# Patient Record
Sex: Female | Born: 1975 | Hispanic: No | Marital: Single | Smoking: Current every day smoker
Health system: Southern US, Community
[De-identification: ages and names within clinical notes are randomized; demographics above are authoritative.]

## PROBLEM LIST (undated history)

## (undated) DIAGNOSIS — R112 Nausea with vomiting, unspecified: Secondary | ICD-10-CM

## (undated) DIAGNOSIS — F419 Anxiety disorder, unspecified: Secondary | ICD-10-CM

## (undated) DIAGNOSIS — I10 Essential (primary) hypertension: Secondary | ICD-10-CM

## (undated) DIAGNOSIS — Z87442 Personal history of urinary calculi: Secondary | ICD-10-CM

## (undated) DIAGNOSIS — J45909 Unspecified asthma, uncomplicated: Secondary | ICD-10-CM

## (undated) DIAGNOSIS — F329 Major depressive disorder, single episode, unspecified: Secondary | ICD-10-CM

## (undated) DIAGNOSIS — R87629 Unspecified abnormal cytological findings in specimens from vagina: Secondary | ICD-10-CM

## (undated) DIAGNOSIS — D649 Anemia, unspecified: Secondary | ICD-10-CM

## (undated) DIAGNOSIS — K219 Gastro-esophageal reflux disease without esophagitis: Secondary | ICD-10-CM

## (undated) DIAGNOSIS — Z9889 Other specified postprocedural states: Secondary | ICD-10-CM

## (undated) DIAGNOSIS — N200 Calculus of kidney: Secondary | ICD-10-CM

## (undated) DIAGNOSIS — G43909 Migraine, unspecified, not intractable, without status migrainosus: Secondary | ICD-10-CM

## (undated) DIAGNOSIS — N83209 Unspecified ovarian cyst, unspecified side: Secondary | ICD-10-CM

## (undated) DIAGNOSIS — T8859XA Other complications of anesthesia, initial encounter: Secondary | ICD-10-CM

## (undated) DIAGNOSIS — F32A Depression, unspecified: Secondary | ICD-10-CM

## (undated) DIAGNOSIS — Z8489 Family history of other specified conditions: Secondary | ICD-10-CM

## (undated) HISTORY — DX: Unspecified asthma, uncomplicated: J45.909

## (undated) HISTORY — DX: Depression, unspecified: F32.A

## (undated) HISTORY — DX: Migraine, unspecified, not intractable, without status migrainosus: G43.909

## (undated) HISTORY — DX: Unspecified abnormal cytological findings in specimens from vagina: R87.629

## (undated) HISTORY — DX: Calculus of kidney: N20.0

## (undated) HISTORY — PX: DIAGNOSTIC LAPAROSCOPY: SUR761

## (undated) HISTORY — PX: CERVICAL BIOPSY  W/ LOOP ELECTRODE EXCISION: SUR135

## (undated) HISTORY — DX: Major depressive disorder, single episode, unspecified: F32.9

---

## 1997-11-01 ENCOUNTER — Other Ambulatory Visit: Admission: RE | Admit: 1997-11-01 | Discharge: 1997-11-01 | Payer: Self-pay | Admitting: *Deleted

## 2013-05-15 HISTORY — PX: BREAST BIOPSY: SHX20

## 2014-01-08 ENCOUNTER — Encounter: Payer: Self-pay | Admitting: Family Medicine

## 2014-01-08 ENCOUNTER — Telehealth (HOSPITAL_COMMUNITY): Payer: Self-pay | Admitting: *Deleted

## 2014-01-08 LAB — POCT PREGNANCY, URINE: Preg Test, Ur: NEGATIVE

## 2014-01-08 NOTE — Telephone Encounter (Signed)
Attempted to call patient to schedule her an appointment for BCCCP. No on answered phone. Left voicemail for patient to call me back.

## 2014-01-09 ENCOUNTER — Encounter (HOSPITAL_COMMUNITY): Payer: Self-pay

## 2014-01-09 ENCOUNTER — Ambulatory Visit (HOSPITAL_COMMUNITY)
Admission: RE | Admit: 2014-01-09 | Discharge: 2014-01-09 | Disposition: A | Discharge status: Home / Self Care | Payer: Self-pay | Source: Ambulatory Visit | Attending: Obstetrics and Gynecology | Admitting: Obstetrics and Gynecology

## 2014-01-09 ENCOUNTER — Telehealth: Payer: Self-pay

## 2014-01-09 VITALS — BP 110/72 | Temp 98.6°F | Ht 71.0 in | Wt 184.6 lb

## 2014-01-09 DIAGNOSIS — Z1239 Encounter for other screening for malignant neoplasm of breast: Secondary | ICD-10-CM

## 2014-01-09 DIAGNOSIS — R87613 High grade squamous intraepithelial lesion on cytologic smear of cervix (HGSIL): Secondary | ICD-10-CM | POA: Insufficient documentation

## 2014-01-09 NOTE — Progress Notes (Signed)
Patient referred to [facility] by the [facility] due to having an abnormal Pap smear on 06/13/2013 and recommending a colposcopy for follow-up.  Pap Smear:  Pap smear not completed today. 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 Referred patient to the [facility] for colposcopy to follow-up for abnormal Pap smear smear. [facility] will call patient today to give her follow-up appointment.  Per patient has no history of abnormal Pap smears prior to the most recent Pap smear. Pap smear result is scanned in EPIC under media.  Physical exam: Breasts Breasts symmetrical. No skin abnormalities bilateral breasts. No nipple retraction bilateral breasts. No nipple discharge bilateral breasts. No lymphadenopathy. No lumps palpated bilateral breasts. No complaints of pain or tenderness on exam. Screening mammogram recommended at age 13 unless clinically indicated prior. Will request results from [facility] for previous mammogram and breast biopsy results.      Pelvic/Bimanual No Pap smear completed today since last Pap smear was 06/13/2013. Pap smear not indicated per BCCCP guidelines.   Smoking cessation discussed with patient. Patient referred to the [facility] Quit line and given information about smoking cessation classes offered at the [facility].

## 2014-01-09 NOTE — Telephone Encounter (Signed)
Called pt and informed pt that we have an appt for her colposcopy scheduled for 01/15/14 @ 1430 pm.  Pt stated understanding with no further questions.

## 2014-01-09 NOTE — Patient Instructions (Signed)
Patient did not need a Pap smear today due to last Pap smear was 06/13/2013. Referred patient to the [facility] for colposcopy to follow-up for abnormal Pap smear smear. The [facility] will call patient today to give her follow-up appointment. Told patient if she does hear from the [facility] today to call me tomorrow. Patient signed release for results from previous breast biopsy at [facility] to determine if any follow-up is needed. Wendy Singh verbalized understanding.  Traci Gafford, Arvil Chaco, RN 9:50 AM

## 2014-01-11 ENCOUNTER — Encounter: Payer: Self-pay | Admitting: *Deleted

## 2014-01-15 ENCOUNTER — Ambulatory Visit (INDEPENDENT_AMBULATORY_CARE_PROVIDER_SITE_OTHER): Payer: Medicaid Other | Admitting: Obstetrics & Gynecology

## 2014-01-15 ENCOUNTER — Other Ambulatory Visit: Payer: Self-pay | Admitting: Obstetrics & Gynecology

## 2014-01-15 ENCOUNTER — Other Ambulatory Visit (HOSPITAL_COMMUNITY)
Admission: RE | Admit: 2014-01-15 | Discharge: 2014-01-15 | Disposition: A | Discharge status: Home / Self Care | Payer: Medicaid Other | Source: Ambulatory Visit | Attending: Obstetrics & Gynecology | Admitting: Obstetrics & Gynecology

## 2014-01-15 ENCOUNTER — Encounter: Payer: Self-pay | Admitting: Obstetrics & Gynecology

## 2014-01-15 VITALS — BP 108/74 | Temp 98.0°F | Ht 71.0 in | Wt 183.9 lb

## 2014-01-15 DIAGNOSIS — N898 Other specified noninflammatory disorders of vagina: Secondary | ICD-10-CM

## 2014-01-15 DIAGNOSIS — N942 Vaginismus: Secondary | ICD-10-CM | POA: Diagnosis present

## 2014-01-15 DIAGNOSIS — R87613 High grade squamous intraepithelial lesion on cytologic smear of cervix (HGSIL): Secondary | ICD-10-CM

## 2014-01-15 LAB — POCT PREGNANCY, URINE: Preg Test, Ur: NEGATIVE

## 2014-01-15 NOTE — Patient Instructions (Signed)

## 2014-01-15 NOTE — Progress Notes (Signed)
  Colposcopy Procedure Note  Indications: Pap smear 7 months ago showed: high-grade squamous intraepithelial neoplasia  (HGSIL-encompassing moderate and severe dysplasia). The prior pap showed no abnormalities.  Prior cervical/vaginal disease: denies prior cervical disease. Prior cervical treatment: no treatment.  Procedure Details  The risks and benefits of the procedure and Written informed consent obtained.  Speculum placed in vagina and excellent visualization of cervix achieved, cervix swabbed x 3 with acetic acid solution.  Findings: Cervix: acetowhite lesion(s) noted at 12 adn 6 o'clock; cervix swabbed with Lugol's solution, SCJ visualized - lesion at 12 & 6 o'clock, cervical biopsies taken at 12 & 6 o'clock, specimen labelled and sent to pathology and hemostasis achieved with Monsel's solution. Vaginal inspection: vaginal colposcopy not performed. Vulvar colposcopy: vulvar colposcopy not performed.  Specimens: cervical biopsies at 6 and 12 o'clock; ECC  Complications: none.  Plan: Specimens labelled and sent to Pathology. Will base further treatment on Pathology findings. Return to discuss Pathology results in 3 weeks. Booking for LEEP at this time.

## 2014-01-16 LAB — WET PREP, GENITAL
Clue Cells Wet Prep HPF POC: NONE SEEN
Trich, Wet Prep: NONE SEEN
Yeast Wet Prep HPF POC: NONE SEEN

## 2014-01-16 LAB — GC/CHLAMYDIA PROBE AMP
CT Probe RNA: NEGATIVE
GC Probe RNA: NEGATIVE

## 2014-01-17 ENCOUNTER — Encounter: Payer: Self-pay | Admitting: Obstetrics & Gynecology

## 2014-01-18 ENCOUNTER — Encounter: Payer: Self-pay | Admitting: Obstetrics & Gynecology

## 2014-01-18 DIAGNOSIS — D069 Carcinoma in situ of cervix, unspecified: Secondary | ICD-10-CM | POA: Insufficient documentation

## 2014-01-19 ENCOUNTER — Encounter: Payer: Self-pay | Admitting: *Deleted

## 2014-02-01 ENCOUNTER — Encounter: Payer: Self-pay | Admitting: *Deleted

## 2014-02-13 ENCOUNTER — Telehealth: Payer: Self-pay

## 2014-02-13 NOTE — Telephone Encounter (Signed)
Patient called to discuss colpo results and all other testing results from 01/15/14. Informed her of results and the need for LEEP-- patient states she knew her scheduled appointment for tomorrow (needed to reschedule) was for LEEP but was never called about the results. Explained LEEP procedure and assured patient that her colpo showed an abnormality, however, it is not cancer. Explained that the LEEP procedure will cut out abnormal tissue. Reassured patient that it is OK she needed to reschedule her appointment to October. Patient verbalized understanding and gratitude. No further questions or concerns.

## 2014-02-14 ENCOUNTER — Encounter: Payer: Self-pay | Admitting: Obstetrics & Gynecology

## 2014-03-26 ENCOUNTER — Encounter: Payer: Self-pay | Admitting: Obstetrics & Gynecology

## 2014-03-26 ENCOUNTER — Other Ambulatory Visit (HOSPITAL_COMMUNITY)
Admission: RE | Admit: 2014-03-26 | Discharge: 2014-03-26 | Disposition: A | Discharge status: Home / Self Care | Payer: Medicaid Other | Source: Ambulatory Visit | Attending: Obstetrics & Gynecology | Admitting: Obstetrics & Gynecology

## 2014-03-26 ENCOUNTER — Ambulatory Visit (INDEPENDENT_AMBULATORY_CARE_PROVIDER_SITE_OTHER): Payer: Medicaid Other | Admitting: Obstetrics & Gynecology

## 2014-03-26 VITALS — BP 100/63 | HR 79 | Temp 97.2°F | Ht 71.0 in | Wt 177.1 lb

## 2014-03-26 DIAGNOSIS — R87619 Unspecified abnormal cytological findings in specimens from cervix uteri: Secondary | ICD-10-CM | POA: Diagnosis present

## 2014-03-26 DIAGNOSIS — D069 Carcinoma in situ of cervix, unspecified: Secondary | ICD-10-CM

## 2014-03-26 DIAGNOSIS — Z01812 Encounter for preprocedural laboratory examination: Secondary | ICD-10-CM

## 2014-03-26 LAB — POCT PREGNANCY, URINE: Preg Test, Ur: NEGATIVE

## 2014-03-26 NOTE — Progress Notes (Signed)
   Subjective:    Patient ID: Wendy Singh, female    DOB: 18-Dec-1975, 38 y.o.   MRN: [id number]  HPI She is here for a LEEP.   Review of Systems     Objective:   Physical Exam UPT negative, consent signed, time out done Cervix prepped with acetic acid. Transformation zone seen in its entirety. Colpo adequate. Changes c/w hGSIL seen in a circumferencial fashion at the os (acetowhite changes) A total of 20 cc of 1% lidocaine was used to do a cervical block. I then used the large "apple core" Leep device to excise a cone shaped piece of cervix. I then obtained a deeper specimen with the samll "apple corer). ECC obtained.  Cautery and Monsel's solution yielded hemostasis  She tolerated the procedure well.        Assessment & Plan:  CIN3- await LEEP pathology

## 2014-04-04 ENCOUNTER — Encounter: Payer: Self-pay | Admitting: *Deleted

## 2014-04-09 ENCOUNTER — Telehealth: Payer: Self-pay | Admitting: General Practice

## 2014-04-09 NOTE — Telephone Encounter (Signed)
Patient called and left message stating she had a LEEP done two weeks ago and isn't having the coffee ground discharge anymore but is having the same pinkish/clear discharge like she was having before the surgery and all her STD testing came back normal so she doesn't know what it could be. Also states that she is just having a weird feeling down there and would like a callback. Called patient back and she states the Dr was going to call her an antibiotic in but never did. Patient denies use of birth control, vaginal itching or odor.  Per chart review patient's symptoms are not listed as well as no antibiotics. Told patient to discuss this with Dr Hulan Fray the next time she is here for her post op visit. Patient verbalized understanding and had no other questions

## 2014-04-16 ENCOUNTER — Encounter: Payer: Self-pay | Admitting: Obstetrics & Gynecology

## 2014-04-25 ENCOUNTER — Ambulatory Visit: Payer: Self-pay | Admitting: Obstetrics & Gynecology

## 2014-05-31 ENCOUNTER — Ambulatory Visit: Payer: Self-pay | Admitting: Obstetrics & Gynecology

## 2014-06-01 ENCOUNTER — Telehealth (HOSPITAL_COMMUNITY): Payer: Self-pay | Admitting: *Deleted

## 2014-06-01 NOTE — Telephone Encounter (Signed)
Patient called me and left voicemail in regards to her breast being swollen with questions about what BCCCP will cover. Called patient back and let her know that her Medicaid is good through January and she can call the [facility] or [facility] to schedule an appointment. Told her after her Medicaid expires that she will need to come to [place] first since a new problem. Let her know that based on her follow-up from her LEEP will depend if she will eligible to renew her BCCCP Medicaid. Explained BCCCP Medicaid and suggested she apply for regular Medicaid when expires if not still eligible. Patient verbalized understanding.

## 2014-06-22 ENCOUNTER — Ambulatory Visit (INDEPENDENT_AMBULATORY_CARE_PROVIDER_SITE_OTHER): Payer: Medicaid Other | Admitting: Obstetrics & Gynecology

## 2014-06-22 ENCOUNTER — Encounter: Payer: Self-pay | Admitting: *Deleted

## 2014-06-22 ENCOUNTER — Encounter: Payer: Self-pay | Admitting: Obstetrics & Gynecology

## 2014-06-22 VITALS — BP 110/62 | HR 61 | Temp 97.8°F | Ht 71.0 in | Wt 185.5 lb

## 2014-06-22 DIAGNOSIS — N898 Other specified noninflammatory disorders of vagina: Secondary | ICD-10-CM

## 2014-06-22 MED ORDER — METRONIDAZOLE 500 MG PO TABS: 500.0000 mg | ORAL_TABLET | Freq: Two times a day (BID) | ORAL | Status: DC

## 2014-06-22 NOTE — Progress Notes (Signed)
   Subjective:    Patient ID: Wendy Singh, female    DOB: July 01, 1975, 39 y.o.   MRN: [id number]  HPI This 39 yo lady is here for follow up after a LEEP for CIN3. Her ecto margins and ECC were negative but endo margins were +.  She has not used contraception for 14 years and does not want any more kids.  She also complains of a heavy white discharge that is causing her to have to wear a pad.   Review of Systems     Objective:   Physical Exam Well healed cervix       Assessment & Plan:  As above- I spoke with Dr. Denman George regarding treatment/follow up options. When presented with these options, she prefers a CKC. Since she does not want kids, she is interested in a l/s BS.  I have just been informed by the nurses that her type of medicaid may not cover either of these procedures. In that case, she can be followed with pap and ECC in 6 months.  I will treat her probable BV with flagyl.

## 2014-06-23 LAB — WET PREP, GENITAL
Trich, Wet Prep: NONE SEEN
Yeast Wet Prep HPF POC: NONE SEEN

## 2014-09-17 ENCOUNTER — Inpatient Hospital Stay (HOSPITAL_COMMUNITY): Admission: RE | Admit: 2014-09-17 | Payer: Medicaid Other | Source: Ambulatory Visit

## 2014-09-19 ENCOUNTER — Encounter (HOSPITAL_COMMUNITY): Payer: Self-pay | Admitting: *Deleted

## 2014-09-25 NOTE — Anesthesia Preprocedure Evaluation (Addendum)
Anesthesia Evaluation  Patient identified by MRN, date of birth, ID band Patient awake    Reviewed: Allergy & Precautions, NPO status , Patient's Chart, lab work & pertinent test results, reviewed documented beta blocker date and time   Airway Mallampati: II   Neck ROM: Full    Dental  (+) Missing, Poor Dentition, Dental Advisory Given, Chipped, Implants   Pulmonary Current Smoker,  breath sounds clear to auscultation        Cardiovascular negative cardio ROS  Rhythm:Irregular     Neuro/Psych Depression    GI/Hepatic negative GI ROS, Neg liver ROS,   Endo/Other    Renal/GU      Musculoskeletal negative musculoskeletal ROS (+)   Abdominal (+)  Abdomen: soft.    Peds  Hematology   Anesthesia Other Findings Many chipped and broken top teeth.  Told they could be broken or dislodged  Reproductive/Obstetrics CIN III                            Anesthesia Physical Anesthesia Plan  ASA: II  Anesthesia Plan: General   Post-op Pain Management:    Induction: Intravenous  Airway Management Planned: Oral ETT  Additional Equipment:   Intra-op Plan:   Post-operative Plan: Extubation in OR  Informed Consent: I have reviewed the patients History and Physical, chart, labs and discussed the procedure including the risks, benefits and alternatives for the proposed anesthesia with the patient or authorized representative who has indicated his/her understanding and acceptance.     Plan Discussed with:   Anesthesia Plan Comments: (Check am PG test, multimodal pain RX)        Anesthesia Quick Evaluation

## 2014-09-26 ENCOUNTER — Ambulatory Visit (HOSPITAL_COMMUNITY): Payer: Medicaid Other | Admitting: Anesthesiology

## 2014-09-26 ENCOUNTER — Encounter (HOSPITAL_COMMUNITY)
Admission: RE | Disposition: A | Discharge status: Home / Self Care | Payer: Self-pay | Source: Ambulatory Visit | Attending: Obstetrics & Gynecology

## 2014-09-26 ENCOUNTER — Ambulatory Visit (HOSPITAL_COMMUNITY)
Admission: RE | Admit: 2014-09-26 | Discharge: 2014-09-26 | Disposition: A | Discharge status: Home / Self Care | Payer: Medicaid Other | Source: Ambulatory Visit | Attending: Obstetrics & Gynecology | Admitting: Obstetrics & Gynecology

## 2014-09-26 DIAGNOSIS — Z87442 Personal history of urinary calculi: Secondary | ICD-10-CM | POA: Insufficient documentation

## 2014-09-26 DIAGNOSIS — Z302 Encounter for sterilization: Secondary | ICD-10-CM | POA: Insufficient documentation

## 2014-09-26 DIAGNOSIS — Z880 Allergy status to penicillin: Secondary | ICD-10-CM | POA: Insufficient documentation

## 2014-09-26 DIAGNOSIS — G43909 Migraine, unspecified, not intractable, without status migrainosus: Secondary | ICD-10-CM | POA: Diagnosis not present

## 2014-09-26 DIAGNOSIS — F329 Major depressive disorder, single episode, unspecified: Secondary | ICD-10-CM | POA: Insufficient documentation

## 2014-09-26 DIAGNOSIS — F17210 Nicotine dependence, cigarettes, uncomplicated: Secondary | ICD-10-CM | POA: Insufficient documentation

## 2014-09-26 DIAGNOSIS — R87613 High grade squamous intraepithelial lesion on cytologic smear of cervix (HGSIL): Secondary | ICD-10-CM | POA: Diagnosis present

## 2014-09-26 HISTORY — PX: CERVICAL CONIZATION W/BX: SHX1330

## 2014-09-26 HISTORY — PX: LAPAROSCOPIC BILATERAL SALPINGECTOMY: SHX5889

## 2014-09-26 LAB — CBC
HCT: 42 % (ref 36.0–46.0)
Hemoglobin: 14.5 g/dL (ref 12.0–15.0)
MCH: 32.2 pg (ref 26.0–34.0)
MCHC: 34.5 g/dL (ref 30.0–36.0)
MCV: 93.1 fL (ref 78.0–100.0)
Platelets: 199 10*3/uL (ref 150–400)
RBC: 4.51 MIL/uL (ref 3.87–5.11)
RDW: 12.4 % (ref 11.5–15.5)
WBC: 7.7 10*3/uL (ref 4.0–10.5)

## 2014-09-26 LAB — PREGNANCY, URINE: Preg Test, Ur: NEGATIVE

## 2014-09-26 SURGERY — SALPINGECTOMY, BILATERAL, LAPAROSCOPIC
Anesthesia: General | Site: Vagina

## 2014-09-26 MED ORDER — MEPERIDINE HCL 25 MG/ML IJ SOLN
INTRAMUSCULAR | Status: DC | PRN
  Administered 2014-09-26: 25 mg via INTRAVENOUS

## 2014-09-26 MED ORDER — KETOROLAC TROMETHAMINE 30 MG/ML IJ SOLN
INTRAMUSCULAR | Status: AC
  Filled 2014-09-26: qty 1

## 2014-09-26 MED ORDER — FENTANYL CITRATE 0.05 MG/ML IJ SOLN
INTRAMUSCULAR | Status: AC
Start: 2014-09-26 — End: 2014-09-26
  Filled 2014-09-26: qty 5

## 2014-09-26 MED ORDER — FENTANYL CITRATE 0.05 MG/ML IJ SOLN: 25.0000 ug | INTRAMUSCULAR | Status: DC | PRN

## 2014-09-26 MED ORDER — ATROPINE SULFATE 0.4 MG/ML IJ SOLN
INTRAMUSCULAR | Status: AC
  Filled 2014-09-26: qty 1

## 2014-09-26 MED ORDER — IODINE STRONG (LUGOLS) 5 % PO SOLN
ORAL | Status: AC
  Filled 2014-09-26: qty 1

## 2014-09-26 MED ORDER — PROPOFOL 10 MG/ML IV BOLUS
INTRAVENOUS | Status: AC
Start: 2014-09-26 — End: 2014-09-26
  Filled 2014-09-26: qty 20

## 2014-09-26 MED ORDER — LIDOCAINE HCL (CARDIAC) 20 MG/ML IV SOLN
INTRAVENOUS | Status: AC
  Filled 2014-09-26: qty 5

## 2014-09-26 MED ORDER — ROCURONIUM BROMIDE 100 MG/10ML IV SOLN
INTRAVENOUS | Status: AC
  Filled 2014-09-26: qty 1

## 2014-09-26 MED ORDER — OXYCODONE-ACETAMINOPHEN 5-325 MG PO TABS: 1.0000 | ORAL_TABLET | Freq: Four times a day (QID) | ORAL | Status: DC | PRN

## 2014-09-26 MED ORDER — MEPERIDINE HCL 25 MG/ML IJ SOLN: 6.2500 mg | INTRAMUSCULAR | Status: DC | PRN

## 2014-09-26 MED ORDER — MIDAZOLAM HCL 2 MG/2ML IJ SOLN
INTRAMUSCULAR | Status: DC | PRN
  Administered 2014-09-26: 2 mg via INTRAVENOUS

## 2014-09-26 MED ORDER — SCOPOLAMINE 1 MG/3DAYS TD PT72
1.0000 | MEDICATED_PATCH | Freq: Once | TRANSDERMAL | Status: DC
  Administered 2014-09-26: 1.5 mg via TRANSDERMAL

## 2014-09-26 MED ORDER — GLYCOPYRROLATE 0.2 MG/ML IJ SOLN
INTRAMUSCULAR | Status: AC
  Filled 2014-09-26: qty 3

## 2014-09-26 MED ORDER — ONDANSETRON HCL 4 MG/2ML IJ SOLN
INTRAMUSCULAR | Status: AC
  Filled 2014-09-26: qty 2

## 2014-09-26 MED ORDER — 0.9 % SODIUM CHLORIDE (POUR BTL) OPTIME
TOPICAL | Status: DC | PRN
  Administered 2014-09-26: 1000 mL

## 2014-09-26 MED ORDER — GLYCOPYRROLATE 0.2 MG/ML IJ SOLN
INTRAMUSCULAR | Status: DC | PRN
  Administered 2014-09-26: 0.2 mg via INTRAVENOUS
  Administered 2014-09-26: 0.4 mg via INTRAVENOUS

## 2014-09-26 MED ORDER — BUPIVACAINE HCL (PF) 0.5 % IJ SOLN
INTRAMUSCULAR | Status: AC
  Filled 2014-09-26: qty 30

## 2014-09-26 MED ORDER — ROCURONIUM BROMIDE 100 MG/10ML IV SOLN
INTRAVENOUS | Status: DC | PRN
  Administered 2014-09-26: 40 mg via INTRAVENOUS

## 2014-09-26 MED ORDER — MEPERIDINE HCL 25 MG/ML IJ SOLN
INTRAMUSCULAR | Status: AC
  Filled 2014-09-26: qty 1

## 2014-09-26 MED ORDER — ACETIC ACID 5 % SOLN
Status: AC
Start: 2014-09-26 — End: 2014-09-26
  Filled 2014-09-26: qty 500

## 2014-09-26 MED ORDER — ONDANSETRON HCL 4 MG/2ML IJ SOLN
INTRAMUSCULAR | Status: DC | PRN
  Administered 2014-09-26: 4 mg via INTRAVENOUS

## 2014-09-26 MED ORDER — DEXAMETHASONE SODIUM PHOSPHATE 10 MG/ML IJ SOLN
INTRAMUSCULAR | Status: DC | PRN
  Administered 2014-09-26: 4 mg via INTRAVENOUS

## 2014-09-26 MED ORDER — ATROPINE SULFATE 0.4 MG/ML IJ SOLN
INTRAMUSCULAR | Status: DC | PRN
  Administered 2014-09-26: 0.4 mg via INTRAVENOUS

## 2014-09-26 MED ORDER — DEXAMETHASONE SODIUM PHOSPHATE 4 MG/ML IJ SOLN
INTRAMUSCULAR | Status: AC
  Filled 2014-09-26: qty 1

## 2014-09-26 MED ORDER — SCOPOLAMINE 1 MG/3DAYS TD PT72
MEDICATED_PATCH | TRANSDERMAL | Status: AC
  Administered 2014-09-26: 1.5 mg via TRANSDERMAL
  Filled 2014-09-26: qty 1

## 2014-09-26 MED ORDER — IBUPROFEN 600 MG PO TABS: 600.0000 mg | ORAL_TABLET | Freq: Four times a day (QID) | ORAL | Status: DC | PRN

## 2014-09-26 MED ORDER — PROPOFOL 10 MG/ML IV BOLUS
INTRAVENOUS | Status: DC | PRN
  Administered 2014-09-26: 150 mg via INTRAVENOUS

## 2014-09-26 MED ORDER — GLYCOPYRROLATE 0.2 MG/ML IJ SOLN
INTRAMUSCULAR | Status: AC
  Filled 2014-09-26: qty 2

## 2014-09-26 MED ORDER — PROMETHAZINE HCL 25 MG/ML IJ SOLN
6.2500 mg | INTRAMUSCULAR | Status: DC | PRN
Start: 2014-09-26 — End: 2014-09-26

## 2014-09-26 MED ORDER — NEOSTIGMINE METHYLSULFATE 10 MG/10ML IV SOLN
INTRAVENOUS | Status: DC | PRN
  Administered 2014-09-26: 3 mg via INTRAVENOUS

## 2014-09-26 MED ORDER — MIDAZOLAM HCL 2 MG/2ML IJ SOLN
INTRAMUSCULAR | Status: AC
  Filled 2014-09-26: qty 2

## 2014-09-26 MED ORDER — LIDOCAINE HCL (CARDIAC) 20 MG/ML IV SOLN
INTRAVENOUS | Status: DC | PRN
  Administered 2014-09-26: 100 mg via INTRAVENOUS

## 2014-09-26 MED ORDER — KETOROLAC TROMETHAMINE 30 MG/ML IJ SOLN
INTRAMUSCULAR | Status: DC | PRN
  Administered 2014-09-26: 30 mg via INTRAVENOUS

## 2014-09-26 MED ORDER — FENTANYL CITRATE 0.05 MG/ML IJ SOLN
INTRAMUSCULAR | Status: DC | PRN
  Administered 2014-09-26: 100 ug via INTRAVENOUS
  Administered 2014-09-26 (×3): 50 ug via INTRAVENOUS

## 2014-09-26 MED ORDER — LACTATED RINGERS IV SOLN
INTRAVENOUS | Status: DC
  Administered 2014-09-26 (×2): via INTRAVENOUS

## 2014-09-26 MED ORDER — NEOSTIGMINE METHYLSULFATE 10 MG/10ML IV SOLN
INTRAVENOUS | Status: AC
  Filled 2014-09-26: qty 1

## 2014-09-26 MED ORDER — BUPIVACAINE HCL (PF) 0.5 % IJ SOLN
INTRAMUSCULAR | Status: DC | PRN
  Administered 2014-09-26: 20 mL
  Administered 2014-09-26: 10 mL

## 2014-09-26 SURGICAL SUPPLY — 47 items
APPLICATOR COTTON TIP 6IN STRL (MISCELLANEOUS) ×3 IMPLANT
BLADE SURG 11 STRL SS (BLADE) ×3 IMPLANT
CATH ROBINSON RED A/P 16FR (CATHETERS) ×3 IMPLANT
CLOTH BEACON ORANGE TIMEOUT ST (SAFETY) ×3 IMPLANT
CONTAINER PREFILL 10% NBF 60ML (FORM) ×6 IMPLANT
COUNTER NEEDLE 1200 MAGNETIC (NEEDLE) ×3 IMPLANT
DILATOR CANAL MILEX (MISCELLANEOUS) ×3 IMPLANT
DRESSING OPSITE X SMALL 2X3 (GAUZE/BANDAGES/DRESSINGS) ×3 IMPLANT
DRSG COVADERM PLUS 2X2 (GAUZE/BANDAGES/DRESSINGS) ×3 IMPLANT
DRSG OPSITE POSTOP 3X4 (GAUZE/BANDAGES/DRESSINGS) ×3 IMPLANT
DRSG TELFA 3X8 NADH (GAUZE/BANDAGES/DRESSINGS) ×3 IMPLANT
DURAPREP 26ML APPLICATOR (WOUND CARE) ×3 IMPLANT
ELECT REM PT RETURN 9FT ADLT (ELECTROSURGICAL) ×3
ELECTRODE REM PT RTRN 9FT ADLT (ELECTROSURGICAL) ×2 IMPLANT
GLOVE BIO SURGEON STRL SZ 6.5 (GLOVE) ×3 IMPLANT
GLOVE BIOGEL PI IND STRL 6 (GLOVE) ×2 IMPLANT
GLOVE BIOGEL PI INDICATOR 6 (GLOVE) ×1
GOWN STRL REUS W/TWL LRG LVL3 (GOWN DISPOSABLE) ×6 IMPLANT
NDL SAFETY ECLIPSE 18X1.5 (NEEDLE) ×2 IMPLANT
NEEDLE HYPO 18GX1.5 SHARP (NEEDLE) ×1
NEEDLE INSUFFLATION 120MM (ENDOMECHANICALS) ×3 IMPLANT
NEEDLE SPNL 18GX3.5 QUINCKE PK (NEEDLE) ×3 IMPLANT
NS IRRIG 1000ML POUR BTL (IV SOLUTION) ×3 IMPLANT
PACK LAPAROSCOPY BASIN (CUSTOM PROCEDURE TRAY) ×3 IMPLANT
PACK VAGINAL MINOR WOMEN LF (CUSTOM PROCEDURE TRAY) ×3 IMPLANT
PAD OB MATERNITY 4.3X12.25 (PERSONAL CARE ITEMS) ×3 IMPLANT
PAD POSITIONER PINK NONSTERILE (MISCELLANEOUS) ×3 IMPLANT
PENCIL BUTTON HOLSTER BLD 10FT (ELECTRODE) ×3 IMPLANT
PROTECTOR NERVE ULNAR (MISCELLANEOUS) ×3 IMPLANT
SCOPETTES 8  STERILE (MISCELLANEOUS) ×1
SCOPETTES 8 STERILE (MISCELLANEOUS) ×2 IMPLANT
SHEARS HARMONIC ACE PLUS 36CM (ENDOMECHANICALS) ×3 IMPLANT
SPONGE SURGIFOAM ABS GEL 12-7 (HEMOSTASIS) IMPLANT
STRIP CLOSURE SKIN 1/2X4 (GAUZE/BANDAGES/DRESSINGS) ×3 IMPLANT
SUT CHROMIC 0 CT 1 (SUTURE) IMPLANT
SUT VIC AB 0 CT1 27 (SUTURE) ×2
SUT VIC AB 0 CT1 27XBRD ANBCTR (SUTURE) ×4 IMPLANT
SUT VICRYL 0 UR6 27IN ABS (SUTURE) ×6 IMPLANT
SUT VICRYL 4-0 PS2 18IN ABS (SUTURE) ×6 IMPLANT
SYR 30ML LL (SYRINGE) ×3 IMPLANT
TOWEL OR 17X24 6PK STRL BLUE (TOWEL DISPOSABLE) ×6 IMPLANT
TROCAR OPTI TIP 5M 100M (ENDOMECHANICALS) ×6 IMPLANT
TROCAR XCEL DIL TIP R 11M (ENDOMECHANICALS) ×3 IMPLANT
TUBING NON-CON 1/4 X 20 CONN (TUBING) ×3 IMPLANT
WARMER LAPAROSCOPE (MISCELLANEOUS) ×3 IMPLANT
WATER STERILE IRR 1000ML POUR (IV SOLUTION) ×3 IMPLANT
YANKAUER SUCT BULB TIP NO VENT (SUCTIONS) ×3 IMPLANT

## 2014-09-26 NOTE — Anesthesia Postprocedure Evaluation (Signed)
  Anesthesia Post-op Note  Patient: Wendy Singh  Procedure(s) Performed: Procedure(s): LAPAROSCOPIC BILATERAL SALPINGECTOMY (Bilateral) CONIZATION CERVIX WITH BIOPSY (N/A)  Patient Location: PACU  Anesthesia Type:General  Level of Consciousness: awake and alert   Airway and Oxygen Therapy: Patient Spontanous Breathing  Post-op Pain: mild  Post-op Assessment: Post-op Vital signs reviewed  Post-op Vital Signs: Reviewed and stable  Last Vitals:  Filed Vitals:   09/26/14 1207  BP: 117/65  Pulse: 69  Temp: 36.8 C  Resp: 20    Complications: No apparent anesthesia complications

## 2014-09-26 NOTE — Op Note (Signed)
09/26/2014  1:56 PM  PATIENT:  Wendy Singh  39 y.o. female  PRE-OPERATIVE DIAGNOSIS:   Undesired fertility, high grade cervical dysplasia  POST-OPERATIVE DIAGNOSIS:   Undesired fertility, high grade cervical dysplasia  PROCEDURE:  Procedure(s): LAPAROSCOPIC BILATERAL SALPINGECTOMY (Bilateral) CONIZATION CERVIX WITH BIOPSY (N/A)  SURGEON:  Surgeon(s) and Role:    * Emily Filbert, MD - Primary   ANESTHESIA:   general and paracervical block  EBL:  Total I/O In: 1000 [I.V.:1000] Out: 200 [Urine:200]  BLOOD ADMINISTERED:none  DRAINS: none   LOCAL MEDICATIONS USED:  MARCAINE     SPECIMEN:  Source of Specimen:  bilateral oviducts, cone biopsy, endocervical curettage  DISPOSITION OF SPECIMEN:  PATHOLOGY  COUNTS:  YES  TOURNIQUET:  * No tourniquets in log *  DICTATION: .Dragon Dictation  PLAN OF CARE: Discharge to home after PACU  PATIENT DISPOSITION:  PACU - hemodynamically stable.   Delay start of Pharmacological VTE agent (>24hrs) due to surgical blood loss or risk of bleeding: not applicable  The risks, benefits, and alternatives of surgery were explained, understood, accepted. She is certain that she wants permanent sterility. She understands that this is not reversible. She understands there is a small failure rate of this procedure. She also would like to have both oviducts removed her due newest recommendations to help prevent ovarian/peritoneal cancer. In the operating room she was placed in the dorsal lithotomy position, and general anesthesia was given without complication. Her abdomen and vagina were prepped and draped in the usual sterile fashion. A timeout procedure was done. A bimanual exam revealed a small anteverted and mobile uterus. Her adnexa felt normal. A Hulka manipulator was placed. Her bladder was emptied with a Robinson catheter. Gloves were changed, and attention was turned to the abdomen. Approximately the 5 mL of 0.5% Marcaine was injected into the  umbilicus. A vertical incision was made at the site. A varies needle was placed intraperitoneally. Low-flow CO2 was used to insufflate the abdomen to approximately 5 L. Patient abdominal pressure was always less than 15. Once a good pneumoperitoneum was established, a 10 mm Excel trocar was placed. Laparoscopy confirmed correct placement. A 5 mm port was placed in each lower quadrant under direct laparoscopic visualization after injecting 0.5% Marcaine in the incision sites. Her pelvis and upper abdomen appeared normal. A Harmonic scapel was used to hemostatically removed both oviducts. I switched to a 5 mm camera and removed the oviducts through the umbilical port.  I removed the 5 mm ports and noted hemostasis. The umbilical fascia was closed with a 0 vicryl suture. No defects were palpable. A subcuticular closure was done with 4-0 Vicryl suture at all incision sites. A Steri-Strip was placed across each incision.   I then proceeded with the cone biopsy portion of the case.  A weighted speculum was placed in the posterior aspect the vagina and the cervix was visualized. Anterior lip of the cervix was grasped with a single-tooth tenaculum, and a paracervical block was done with 10 mL of 0.5% Marcaine. A cone shaped specimen of the cervix was removed and endocervical curettings were obtained. The cone bed was cauterized with the Bovie. Hemostasis was noted. A pursestring closure of the cervix was done with a 2-0 Vicryl suture. Excellent hemostasis was noted. The instrument, sponge, and needle counts were correct. She was taken to the recovery room in stable condition. She tolerated the procedure well.

## 2014-09-26 NOTE — Discharge Instructions (Signed)
Laparoscopic Tubal Ligation Care After Refer to this sheet in the next few weeks. These instructions provide you with information on caring for yourself after your procedure. Your caregiver may also give you more specific instructions. Your treatment has been planned according to current medical practices, but problems sometimes occur. Call your caregiver if you have any problems or questions after your procedure. HOME CARE INSTRUCTIONS   Rest the remainder of the day.  Only take over-the-counter or prescription medicines for pain, discomfort, or fever as directed by your caregiver. Do not take aspirin. It can cause bleeding.  Gradually resume daily activities, diet, rest, driving, and work.  Avoid sexual intercourse for 2 weeks or as directed.  Do not use tampons or douche.  Do not drive while taking pain medicine.  Do not lift anything over 5 pounds for 2 weeks or as directed.  Only take showers, not baths, until you are seen by your caregiver.  Change bandages (dressings) as directed.  Take your temperature twice a day and record it.  Try to have help for the first 7 to 10 days for your household needs.  Return to your caregiver to get your stitches (sutures) removed and for follow-up visits as directed. SEEK MEDICAL CARE IF:   You have redness, swelling, or increasing pain in a wound.  You have drainage from a wound lasting longer than 1 day.  Your pain is getting worse.  You have a rash.  You become dizzy or lightheaded.  You have a reaction to your medicine.  You need stronger medicine or a change in your pain medicine.  You notice a bad smell coming from a wound or dressing.  Your wound breaks open after the sutures have been removed.  You are constipated. SEEK IMMEDIATE MEDICAL CARE IF:   You faint.  You have a fever.  You have increasing abdominal pain.  You have severe pain in your shoulders.  You have bleeding or drainage from the suture sites or  vagina following surgery.  You have shortness of breath or difficulty breathing.  You have chest or leg pain.  You have persistent nausea, vomiting, or diarrhea. MAKE SURE YOU:   Understand these instructions.  Watch your condition.  Get help right away if you are not doing well or get worse. Document Released: 12/19/2004 Document Revised: 12/01/2011 Document Reviewed: 09/12/2011 [facility] Patient Information 2015 [employer], [place]. This information is not intended to replace advice given to you by your health care provider. Make sure you discuss any questions you have with your health care provider.  DO NOT HAVE  SEX UNTIL AFTER YOUR POST OP VISIT.  Conization of the Cervix, Care After These instructions give you information on caring for yourself after your procedure. Your doctor may also give you more specific instructions. Call your doctor if you have any problems or questions after your procedure. HOME CARE   Have someone drive you home after the procedure.  Only take medicines as told by your doctor. Do not take aspirin. It can cause bleeding.  Take showers for the first week. Do not take baths, swim, or use hot tubs until your doctor says it is okay.  Do not douche, use tampons, or have sex (sexual intercourse) until your doctor says it is okay.  For 7-14 days, avoid:  Being very active.  Exercising.  Heavy lifting.  Resume your usual diet unless your doctor tells you differently.  If you cannot poop (you are constipated), you may:  Take a  mild laxative as told by your doctor.  Add fruit and bran to your diet.  Make sure to drink enough fluids to keep your pee (urine) clear or pale yellow.  Keep your follow-up appointments as told by your doctor. GET HELP IF:  You have a rash.  You are dizzy or lightheaded.  You feel sick to your stomach (nauseous).  You develop a bad smelling vaginal discharge. GET HELP RIGHT AWAY IF:   You have blood clots or  bleeding that is heavier than a normal menstrual period. For example, you soak a pad in less than 1 hour.  You have bright red bleeding.  You have a fever over 101F (38.3C) or lasting symptoms for more than 2-3 days.  You have a fever over 101F (38.3C) and your symptoms suddenly get worse.  You have increasing cramps.  You pass out (faint).  You have pain when peeing.  You have bloody pee.  You start throwing up (vomiting).  Your pain does not get better when you take your medicine.  Your have a lot of pain.  Your pain is getting worse. MAKE SURE YOU:  Understand these instructions.  Will watch your condition.  Will get help right away if you are not doing well or get worse. Document Released: 03/10/2008 Document Revised: 06/06/2013 Document Reviewed: 11/25/2012 [facility] Patient Information 2015 [place], [place]. This information is not intended to replace advice given to you by your health care provider. Make sure you discuss any questions you have with your health care provider.   Post Anesthesia Home Care Instructions  Activity: Get plenty of rest for the remainder of the day. A responsible adult should stay with you for 24 hours following the procedure.  For the next 24 hours, DO NOT: -Drive a car -Paediatric nurse -Drink alcoholic beverages -Take any medication unless instructed by your physician -Make any legal decisions or sign important papers.  Meals: Start with liquid foods such as gelatin or soup. Progress to regular foods as tolerated. Avoid greasy, spicy, heavy foods. If nausea and/or vomiting occur, drink only clear liquids until the nausea and/or vomiting subsides. Call your physician if vomiting continues.  Special Instructions/Symptoms: Your throat may feel dry or sore from the anesthesia or the breathing tube placed in your throat during surgery. If this causes discomfort, gargle with warm salt water. The discomfort should disappear within 24  hours.  If you had a scopolamine patch placed behind your ear for the management of post- operative nausea and/or vomiting:  1. The medication in the patch is effective for 72 hours, after which it should be removed.  Wrap patch in a tissue and discard in the trash. Wash hands thoroughly with soap and water. 2. You may remove the patch earlier than 72 hours if you experience unpleasant side effects which may include dry mouth, dizziness or visual disturbances. 3. Avoid touching the patch. Wash your hands with soap and water after contact with the patch.

## 2014-09-26 NOTE — Anesthesia Procedure Notes (Signed)
Procedure Name: Intubation Date/Time: 09/26/2014 1:11 PM Performed by: Hewitt Blade Pre-anesthesia Checklist: Patient identified, Emergency Drugs available, Patient being monitored and Suction available Patient Re-evaluated:Patient Re-evaluated prior to inductionOxygen Delivery Method: Circle system utilized Preoxygenation: Pre-oxygenation with 100% oxygen Intubation Type: IV induction Ventilation: Mask ventilation without difficulty Laryngoscope Size: Mac and 3 Grade View: Grade I Tube type: Oral Number of attempts: 1 Airway Equipment and Method: Stylet Placement Confirmation: ETT inserted through vocal cords under direct vision,  positive ETCO2 and breath sounds checked- equal and bilateral Secured at: 20 cm Tube secured with: Tape Dental Injury: Teeth and Oropharynx as per pre-operative assessment

## 2014-09-26 NOTE — Transfer of Care (Signed)
Immediate Anesthesia Transfer of Care Note  Patient: Wendy Singh  Procedure(s) Performed: Procedure(s): LAPAROSCOPIC BILATERAL SALPINGECTOMY (Bilateral) CONIZATION CERVIX WITH BIOPSY (N/A)  Patient Location: PACU  Anesthesia Type:General  Level of Consciousness: awake, alert  and oriented  Airway & Oxygen Therapy: Patient Spontanous Breathing and Patient connected to nasal cannula oxygen  Post-op Assessment: Report given to RN, Post -op Vital signs reviewed and stable and Patient moving all extremities  Post vital signs: Reviewed and stable  Last Vitals:  Filed Vitals:   09/26/14 1207  BP: 117/65  Pulse: 69  Temp: 36.8 C  Resp: 20    Complications: No apparent anesthesia complications

## 2014-09-26 NOTE — H&P (Signed)
Wendy Singh is an 39 y.o. lady had a LEEP for CIN3. Her ecto margins and ECC were negative but endo margins were +.  She has not used contraception for 14 years and does not want any more kids.   Pertinent Gynecological History: Menses: flow is excessive with use of 10 pads or tampons on heaviest days Bleeding: monthly Contraception: none DES exposure: denies Blood transfusions: none Sexually transmitted diseases: no past history Previous GYN Procedures: LEEP  Last mammogram: normal Date: 12/14 Last pap: abnormal: HGSIL  OB History: LC 2   Menstrual History: Menarche age: 77 Patient's last menstrual period was 09/03/2014 (approximate).    Past Medical History  Diagnosis Date  . Vaginal Pap smear, abnormal   . Kidney stones   . Depression   . Migraines     Past Surgical History  Procedure Laterality Date  . Breast biopsy  05/2013    Family History  Problem Relation Age of Onset  . Hypertension Father   . Diabetes Brother     Social History:  reports that she has been smoking Cigarettes.  She has a 2.5 pack-year smoking history. She has never used smokeless tobacco. She reports that she does not drink alcohol or use illicit drugs.  Allergies:  Allergies  Allergen Reactions  . Penicillins     Prescriptions prior to admission  Medication Sig Dispense Refill Last Dose  . PARoxetine (PAXIL) 20 MG tablet Take 20 mg by mouth daily.     . metroNIDAZOLE (FLAGYL) 500 MG tablet Take 1 tablet (500 mg total) by mouth 2 (two) times daily. (Patient not taking: Reported on 09/12/2014) 14 tablet 0   . Multiple Vitamin (MULTIVITAMIN) tablet Take 1 tablet by mouth daily.   Taking    ROS  Monogamous for 6 years Works at [employer] in [employer]  Blood pressure 117/65, pulse 69, temperature 98.2 F (36.8 C), temperature source Oral, resp. rate 20, height 5\' 11"  (1.803 m), weight 83.462 kg (184 lb), last menstrual period 09/03/2014, SpO2 100 %. Physical Exam  Heart-  rrr Lungs- CTAB Abd- benign  No results found for this or any previous visit (from the past 24 hour(s)).  No results found.  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 09/26/2014, 12:13 PM

## 2014-09-27 ENCOUNTER — Encounter (HOSPITAL_COMMUNITY): Payer: Self-pay | Admitting: Obstetrics & Gynecology

## 2014-09-28 ENCOUNTER — Telehealth: Payer: Self-pay | Admitting: *Deleted

## 2014-09-28 NOTE — Telephone Encounter (Signed)
Patient had surgery recently and has one incision that is very swollen and blue. She also reports severe abdominal pain that is not relieved by alternating ibuprofen and percocet. I advised that patient go to [facility] for evaluation. Patient is agreeable to this.

## 2014-10-17 ENCOUNTER — Encounter: Payer: Self-pay | Admitting: *Deleted

## 2014-10-22 ENCOUNTER — Telehealth: Payer: Self-pay | Admitting: General Practice

## 2014-10-22 NOTE — Telephone Encounter (Signed)
Patient called and left message stating she had surgery on 4/13 and all her incisions have healed except around her belly button. States the incision looks closed but it is draining yellow pus and it has an odor. Called patient, no answer- left message stating we are trying to return your phone call, please call us back at the clinics

## 2014-10-23 ENCOUNTER — Ambulatory Visit: Payer: Medicaid Other

## 2014-10-23 VITALS — BP 118/61 | HR 81 | Temp 98.4°F | Ht 69.0 in | Wt 186.8 lb

## 2014-10-23 DIAGNOSIS — Z5189 Encounter for other specified aftercare: Secondary | ICD-10-CM

## 2014-10-23 NOTE — Progress Notes (Signed)
Pt here today for wound check.  Pt had surgery on 09/26/14 from laparoscopy; pt reports having yellowish-clearish drainage from the site (inside umbilicus) with an odor.  Umbilicus healing well, no odor, afebrile, no drainage.  Advised pt to keep area inside belly button dry due to it being a closed, warm area.  Pt stated understanding with no further questions.  Verified pts post op appt scheduled for 11/07/14.  Pt agreed.

## 2014-10-23 NOTE — Telephone Encounter (Signed)
Patient came into clinic today for wound check

## 2014-11-07 ENCOUNTER — Ambulatory Visit: Payer: Medicaid Other | Admitting: Obstetrics & Gynecology

## 2014-11-14 ENCOUNTER — Ambulatory Visit: Payer: Medicaid Other | Admitting: Obstetrics & Gynecology

## 2014-12-05 ENCOUNTER — Other Ambulatory Visit (HOSPITAL_COMMUNITY)
Admission: RE | Admit: 2014-12-05 | Discharge: 2014-12-05 | Disposition: A | Discharge status: Home / Self Care | Payer: Medicaid Other | Source: Ambulatory Visit | Attending: Obstetrics & Gynecology | Admitting: Obstetrics & Gynecology

## 2014-12-05 ENCOUNTER — Ambulatory Visit (INDEPENDENT_AMBULATORY_CARE_PROVIDER_SITE_OTHER): Payer: Medicaid Other | Admitting: Obstetrics & Gynecology

## 2014-12-05 ENCOUNTER — Encounter: Payer: Self-pay | Admitting: Obstetrics & Gynecology

## 2014-12-05 VITALS — BP 122/78 | HR 69 | Ht 71.0 in | Wt 192.3 lb

## 2014-12-05 DIAGNOSIS — R87619 Unspecified abnormal cytological findings in specimens from cervix uteri: Secondary | ICD-10-CM | POA: Diagnosis not present

## 2014-12-05 DIAGNOSIS — N9089 Other specified noninflammatory disorders of vulva and perineum: Secondary | ICD-10-CM | POA: Diagnosis not present

## 2014-12-05 DIAGNOSIS — Z9889 Other specified postprocedural states: Secondary | ICD-10-CM

## 2014-12-05 NOTE — Addendum Note (Signed)
Addended by: Shelly Coss on: 12/05/2014 04:23 PM   Modules accepted: Orders

## 2014-12-05 NOTE — Progress Notes (Signed)
   Subjective:    Patient ID: Wendy Singh, female    DOB: 11/12/1975, 39 y.o.   MRN: [id number]  HPI  This 39 yo lady is here for a 2 month post op visit. She denies any problems. She has not had sex since her surgery. She has 2 different types of right labial lesions.  Review of Systems     Objective:   Physical Exam  Well-healed incisions (all three) Cervix well-healed She has what appears to be a 1 cm wart with a narrow stalk on her right lower labia majora as well as another 5 mm lesion that may be a skin tag.  I used 1% lidocaine at both sites and excised both lesions. I used silver nitrate to obtain hemostasis.      Assessment & Plan:  H/o HGSIL- now s/p LEEP and CKC- pap in 1 year Labial lesions- await pathology

## 2017-05-28 ENCOUNTER — Other Ambulatory Visit (HOSPITAL_COMMUNITY): Payer: Self-pay

## 2017-05-28 ENCOUNTER — Other Ambulatory Visit (HOSPITAL_COMMUNITY): Payer: Self-pay | Admitting: *Deleted

## 2017-05-28 DIAGNOSIS — N6320 Unspecified lump in the left breast, unspecified quadrant: Secondary | ICD-10-CM

## 2017-06-17 ENCOUNTER — Other Ambulatory Visit (HOSPITAL_COMMUNITY): Payer: Self-pay | Admitting: Obstetrics and Gynecology

## 2017-06-17 ENCOUNTER — Ambulatory Visit
Admission: RE | Admit: 2017-06-17 | Discharge: 2017-06-17 | Disposition: A | Discharge status: Home / Self Care | Payer: No Typology Code available for payment source | Source: Ambulatory Visit | Attending: Obstetrics and Gynecology | Admitting: Obstetrics and Gynecology

## 2017-06-17 ENCOUNTER — Encounter (HOSPITAL_COMMUNITY): Payer: Self-pay

## 2017-06-17 ENCOUNTER — Ambulatory Visit (HOSPITAL_COMMUNITY)
Admission: RE | Admit: 2017-06-17 | Discharge: 2017-06-17 | Disposition: A | Discharge status: Home / Self Care | Payer: Self-pay | Source: Ambulatory Visit | Attending: Obstetrics and Gynecology | Admitting: Obstetrics and Gynecology

## 2017-06-17 VITALS — BP 112/68 | Temp 98.6°F | Ht 71.0 in | Wt 227.0 lb

## 2017-06-17 DIAGNOSIS — Z01419 Encounter for gynecological examination (general) (routine) without abnormal findings: Secondary | ICD-10-CM

## 2017-06-17 DIAGNOSIS — N6320 Unspecified lump in the left breast, unspecified quadrant: Secondary | ICD-10-CM

## 2017-06-17 DIAGNOSIS — N644 Mastodynia: Secondary | ICD-10-CM

## 2017-06-17 DIAGNOSIS — N898 Other specified noninflammatory disorders of vagina: Secondary | ICD-10-CM

## 2017-06-17 DIAGNOSIS — N6321 Unspecified lump in the left breast, upper outer quadrant: Secondary | ICD-10-CM

## 2017-06-17 NOTE — Patient Instructions (Signed)
Explained breast self awareness with Wendy Singh. Let patient know that if today's Pap smear is normal then her next Pap smear will be due in one year due to her history. Referred patient to the [facility] for a diagnostic mammogram and possible left breast ultrasound. Appointment scheduled for Thursday, June 17, 2017 at 1120. Let patient know will follow up with her within the next week with results of Pap smear and wet prep by phone. Smoking cessation discussed with patient. Patient referred to the [facility] Quit line and given information about smoking cessation classes offered at the [facility]. Wendy Singh verbalized understanding.  Brannock, Arvil Chaco, RN 12:46 PM

## 2017-06-17 NOTE — Progress Notes (Signed)
Complaints of left breast lump x 1.5 months that comes and goes. Patient stated it flares up during menstrual period. Patient complained of left breast pain at area that lump occurs that is constant. Patient rates the pain at a 5-6 out of 10. Patient states the pain is worse when touched.  Pap Smear: Pap smear completed today. 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  Per patient her last Pap smear is the only abnormal Pap smear she has had. Last Pap smear, Colposcopy, LEEP, and CKC results are in Epic.  Physical exam: Breasts Right breast larger than left breast that per patient has noticed the change over the past month. No skin abnormalities bilateral breasts. No nipple retraction bilateral breasts. No nipple discharge bilateral breasts. No lymphadenopathy. No lumps palpated right breast. Palpated a pea sized lump within the left breast at 1 o'clock 9 cm from the nipple. Complaints of tenderness when palpated lump and within the right breast at 3 o'clock next to nipple on exam. Referred patient to the [facility] for a diagnostic mammogram and possible left breast ultrasound. Appointment scheduled for Thursday, June 17, 2017 at 1120.  Pelvic/Bimanual   Ext Genitalia No lesions, no swelling and no discharge observed on external genitalia.         Vagina Vagina pink and normal texture. No lesions and thick creamy white discharge observed in vagina. Wet prep completed.     Cervix Cervix is present. Cervix pink and of normal texture. No discharge observed.     Uterus Uterus is present and palpable. Uterus in normal position and normal size.        Adnexae Bilateral ovaries present and palpable. Complaints of tenderness when palpated right ovary. Will refer to the [facility]  for follow-up.         Rectovaginal No rectal exam completed today since patient had no rectal complaints. No skin abnormalities observed on exam.    Smoking History: Smoking cessation discussed with patient. Patient referred to the [facility] Quit line and given information about smoking cessation classes offered at the [facility].  Patient Navigation: Patient education provided. Access to services provided for patient through [facility] program.

## 2017-06-18 ENCOUNTER — Encounter (HOSPITAL_COMMUNITY): Payer: Self-pay | Admitting: *Deleted

## 2017-06-18 ENCOUNTER — Other Ambulatory Visit (HOSPITAL_COMMUNITY): Payer: Self-pay | Admitting: *Deleted

## 2017-06-18 ENCOUNTER — Telehealth (HOSPITAL_COMMUNITY): Payer: Self-pay | Admitting: *Deleted

## 2017-06-18 LAB — CYTOLOGY - PAP
Bacterial vaginitis: POSITIVE — AB
Candida vaginitis: NEGATIVE
Diagnosis: NEGATIVE
HPV: NOT DETECTED
Trichomonas: POSITIVE — AB

## 2017-06-18 MED ORDER — METRONIDAZOLE 500 MG PO TABS: 500.0000 mg | ORAL_TABLET | Freq: Two times a day (BID) | ORAL | 0 refills | Status: DC

## 2017-06-18 NOTE — Telephone Encounter (Signed)
Telephoned patient at home number and advised patient of negative pap smear results.HPV was negative. Next pap smear due in one year.  Advised patient pap did show bacterial vaginosis and trichmomonas. Medication was called into pharmacy. Advised patient to finish all medication, no alcohol while taking medication, no sexual intercourse until finishes medication, and to make sure patient is treated also.

## 2017-07-07 ENCOUNTER — Ambulatory Visit: Payer: No Typology Code available for payment source | Admitting: Obstetrics & Gynecology

## 2017-12-11 ENCOUNTER — Encounter (HOSPITAL_COMMUNITY): Payer: Self-pay | Admitting: Emergency Medicine

## 2017-12-11 ENCOUNTER — Emergency Department (HOSPITAL_COMMUNITY)
Admission: EM | Admit: 2017-12-11 | Discharge: 2017-12-11 | Disposition: A | Discharge status: Home / Self Care | Payer: Self-pay | Attending: Emergency Medicine | Admitting: Emergency Medicine

## 2017-12-11 ENCOUNTER — Emergency Department (HOSPITAL_COMMUNITY): Payer: Self-pay

## 2017-12-11 ENCOUNTER — Other Ambulatory Visit: Payer: Self-pay

## 2017-12-11 DIAGNOSIS — F17210 Nicotine dependence, cigarettes, uncomplicated: Secondary | ICD-10-CM | POA: Insufficient documentation

## 2017-12-11 DIAGNOSIS — R109 Unspecified abdominal pain: Secondary | ICD-10-CM

## 2017-12-11 DIAGNOSIS — N83201 Unspecified ovarian cyst, right side: Secondary | ICD-10-CM

## 2017-12-11 DIAGNOSIS — N83291 Other ovarian cyst, right side: Secondary | ICD-10-CM | POA: Insufficient documentation

## 2017-12-11 DIAGNOSIS — N760 Acute vaginitis: Secondary | ICD-10-CM | POA: Insufficient documentation

## 2017-12-11 DIAGNOSIS — R1084 Generalized abdominal pain: Secondary | ICD-10-CM | POA: Insufficient documentation

## 2017-12-11 DIAGNOSIS — Z79899 Other long term (current) drug therapy: Secondary | ICD-10-CM | POA: Insufficient documentation

## 2017-12-11 DIAGNOSIS — B9689 Other specified bacterial agents as the cause of diseases classified elsewhere: Secondary | ICD-10-CM

## 2017-12-11 DIAGNOSIS — R102 Pelvic and perineal pain: Secondary | ICD-10-CM | POA: Insufficient documentation

## 2017-12-11 DIAGNOSIS — D259 Leiomyoma of uterus, unspecified: Secondary | ICD-10-CM | POA: Insufficient documentation

## 2017-12-11 HISTORY — DX: Unspecified ovarian cyst, unspecified side: N83.209

## 2017-12-11 LAB — URINALYSIS, ROUTINE W REFLEX MICROSCOPIC
Bacteria, UA: NONE SEEN
Bilirubin Urine: NEGATIVE
Glucose, UA: NEGATIVE mg/dL
Ketones, ur: NEGATIVE mg/dL
Leukocytes, UA: NEGATIVE
Nitrite: NEGATIVE
Protein, ur: NEGATIVE mg/dL
Specific Gravity, Urine: 1.021 (ref 1.005–1.030)
pH: 5 (ref 5.0–8.0)

## 2017-12-11 LAB — WET PREP, GENITAL
Sperm: NONE SEEN
Trich, Wet Prep: NONE SEEN
Yeast Wet Prep HPF POC: NONE SEEN

## 2017-12-11 LAB — PREGNANCY, URINE: Preg Test, Ur: NEGATIVE

## 2017-12-11 MED ORDER — HYDROMORPHONE HCL 1 MG/ML IJ SOLN
1.0000 mg | Freq: Once | INTRAMUSCULAR | Status: AC
  Administered 2017-12-11: 1 mg via INTRAMUSCULAR
  Filled 2017-12-11: qty 1

## 2017-12-11 MED ORDER — OXYCODONE-ACETAMINOPHEN 5-325 MG PO TABS: ORAL_TABLET | ORAL | 0 refills | Status: DC

## 2017-12-11 MED ORDER — METRONIDAZOLE 500 MG PO TABS: 500.0000 mg | ORAL_TABLET | Freq: Two times a day (BID) | ORAL | 0 refills | Status: DC

## 2017-12-11 MED ORDER — KETOROLAC TROMETHAMINE 60 MG/2ML IM SOLN
60.0000 mg | Freq: Once | INTRAMUSCULAR | Status: AC
  Administered 2017-12-11: 60 mg via INTRAMUSCULAR
  Filled 2017-12-11: qty 2

## 2017-12-11 MED ORDER — ONDANSETRON 8 MG PO TBDP
8.0000 mg | ORAL_TABLET | Freq: Once | ORAL | Status: AC
  Administered 2017-12-11: 8 mg via ORAL
  Filled 2017-12-11: qty 1

## 2017-12-11 NOTE — ED Provider Notes (Signed)
[facility] EMERGENCY DEPARTMENT Provider Note   CSN: [id number] Arrival date & time: 12/11/17  1331     History   Chief Complaint Chief Complaint  Patient presents with  . Flank Pain    HPI Wendy Singh is a 42 y.o. female.   Flank Pain    Pt was seen at 1350. Per pt, c/o sudden onset and persistence of constant right sided low back "pain" that began this afternoon PTA.  Pt describes the pain as "sharp," and radiating into the right side of her abd.  Has been associated with nausea.  States she had "troubles urinating" a few days ago, but cannot be more specific, and this has resolved. Denies vaginal bleeding/dishcarge, no dysuria/hematuria, no abd pain, no vomiting/diarrhea, no CP/SOB, no fevers, no rash.    Past Medical History:  Diagnosis Date  . Depression   . Kidney stones   . Migraines   . Ovarian cyst   . Vaginal Pap smear, abnormal     Patient Active Problem List   Diagnosis Date Noted  . 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 HSIL (high grade squamous intraepithelial lesion) on Pap smear of cervix 01/09/2014    Past Surgical History:  Procedure Laterality Date  . BREAST BIOPSY  05/2013  . CERVICAL BIOPSY  W/ LOOP ELECTRODE EXCISION    . CERVICAL CONIZATION W/BX N/A 09/26/2014   Procedure: CONIZATION CERVIX WITH BIOPSY;  Surgeon: Emily Filbert, MD;  Location: [facility];  Service: Gynecology;  Laterality: N/A;  . LAPAROSCOPIC BILATERAL SALPINGECTOMY Bilateral 09/26/2014   Procedure: LAPAROSCOPIC BILATERAL SALPINGECTOMY;  Surgeon: Emily Filbert, MD;  Location: [facility];  Service: Gynecology;  Laterality: Bilateral;     OB History    Gravida  3   Para  2   Term  2   Preterm      AB  1   Living  2     SAB  1   TAB      Ectopic      Multiple      Live Births               Home Medications    Prior to Admission medications   Medication Sig Start Date End Date Taking? Authorizing Provider  tamsulosin (FLOMAX) 0.4 MG CAPS capsule  Take 0.4 mg by mouth daily.   Yes [provider]    Family History Family History  Problem Relation Age of Onset  . Hypertension Father   . Diabetes Brother     Social History Social History   Tobacco Use  . Smoking status: Current Every Day Smoker    Packs/day: 0.25    Years: 10.00    Pack years: 2.50    Types: Cigarettes  . Smokeless tobacco: Never Used  Substance Use Topics  . Alcohol use: No  . Drug use: No     Allergies   Penicillins   Review of Systems Review of Systems  Genitourinary: Positive for flank pain.  ROS: Statement: All systems negative except as marked or noted in the HPI; Constitutional: Negative for fever and chills. ; ; Eyes: Negative for eye pain, redness and discharge. ; ; ENMT: Negative for ear pain, hoarseness, nasal congestion, sinus pressure and sore throat. ; ; Cardiovascular: Negative for chest pain, palpitations, diaphoresis, dyspnea and peripheral edema. ; ; Respiratory: Negative for cough, wheezing and stridor. ; ; Gastrointestinal: Negative for nausea, vomiting, diarrhea, abdominal pain, blood in stool, hematemesis, jaundice and rectal bleeding. . ; ;  Genitourinary: Negative for dysuria, and hematuria. ; ; GYN:  No pelvic pain, no vaginal bleeding, no vaginal discharge, no vulvar pain. ;; Musculoskeletal: +LBP. Negative for neck pain. Negative for swelling and trauma.; ; Skin: Negative for pruritus, rash, abrasions, blisters, bruising and skin lesion.; ; Neuro: Negative for headache, lightheadedness and neck stiffness. Negative for weakness, altered level of consciousness, altered mental status, extremity weakness, paresthesias, involuntary movement, seizure and syncope.       Physical Exam Updated Vital Signs BP (!) 143/96 (BP Location: Right Arm)   Pulse 79   Temp 98.7 F (37.1 C) (Temporal)   Resp 16   Ht 6' (1.829 m)   Wt 103 kg (227 lb)   LMP 11/14/2017   SpO2 100%   BMI 30.79 kg/m   Physical Exam 1355: Physical  examination:  Nursing notes reviewed; Vital signs and O2 SAT reviewed;  Constitutional: Well developed, Well nourished, Well hydrated, Uncomfortable appearing.; Head:  Normocephalic, atraumatic; Eyes: EOMI, PERRL, No scleral icterus; ENMT: Mouth and pharynx normal, Mucous membranes moist; Neck: Supple, Full range of motion, No lymphadenopathy; Cardiovascular: Regular rate and rhythm, No gallop; Respiratory: Breath sounds clear & equal bilaterally, No wheezes.  Speaking full sentences with ease, Normal respiratory effort/excursion; Chest: Nontender, Movement normal; Abdomen: Soft, Nontender, Nondistended, Normal bowel sounds; Genitourinary: No CVA tenderness.; Spine:  No midline CS, TS, LS tenderness. +mild TTP right lumbar paraspinal muscles.;; Extremities: Peripheral pulses normal, No tenderness, No edema, No calf edema or asymmetry.; Neuro: AA&Ox3, Major CN grossly intact.  Speech clear. No gross focal motor or sensory deficits in extremities.; Skin: Color normal, Warm, Dry.   ED Treatments / Results  Labs (all labs ordered are listed, but only abnormal results are displayed)   EKG None  Radiology   Procedures Procedures (including critical care time)  Medications Ordered in ED Medications  ondansetron (ZOFRAN-ODT) disintegrating tablet 8 mg (8 mg Oral Given 12/11/17 1412)  HYDROmorphone (DILAUDID) injection 1 mg (1 mg Intramuscular Given 12/11/17 1414)     Initial Impression / Assessment and Plan / ED Course  I have reviewed the triage vital signs and the nursing notes.  Pertinent labs & imaging results that were available during my care of the patient were reviewed by me and considered in my medical decision making (see chart for details)..   MDM Reviewed: previous chart, nursing note and vitals Interpretation: CT scan and labs   Results for orders placed or performed during the hospital encounter of 12/11/17  Urinalysis, Routine w reflex microscopic- may I&O cath if menses    Result Value Ref Range   Color, Urine YELLOW YELLOW   APPearance HAZY (A) CLEAR   Specific Gravity, Urine 1.021 1.005 - 1.030   pH 5.0 5.0 - 8.0   Glucose, UA NEGATIVE NEGATIVE mg/dL   Hgb urine dipstick MODERATE (A) NEGATIVE   Bilirubin Urine NEGATIVE NEGATIVE   Ketones, ur NEGATIVE NEGATIVE mg/dL   Protein, ur NEGATIVE NEGATIVE mg/dL   Nitrite NEGATIVE NEGATIVE   Leukocytes, UA NEGATIVE NEGATIVE   RBC / HPF 0-5 0 - 5 RBC/hpf   WBC, UA 0-5 0 - 5 WBC/hpf   Bacteria, UA NONE SEEN NONE SEEN   Squamous Epithelial / LPF 6-10 0 - 5   Mucus PRESENT   Pregnancy, urine  Result Value Ref Range   Preg Test, Ur NEGATIVE NEGATIVE   Ct Renal Stone Study Result Date: 12/11/2017 CLINICAL DATA:  Right-sided flank pain and dark urine. EXAM: CT ABDOMEN AND PELVIS WITHOUT CONTRAST TECHNIQUE:  Multidetector CT imaging of the abdomen and pelvis was performed following the standard protocol without IV contrast. COMPARISON:  05/27/2017 FINDINGS: Lower chest: No acute abnormality. Hepatobiliary: No focal liver abnormality is seen. No gallstones, gallbladder wall thickening, or biliary dilatation. Pancreas: Unremarkable. No pancreatic ductal dilatation or surrounding inflammatory changes. Spleen: Normal in size without focal abnormality. Adrenals/Urinary Tract: Normal adrenal glands. Normal left kidney and ureters. Nonobstructive 6 mm calculus in the lower pole of the right kidney. Previously demonstrated equivocal 3 mm calculus, adjacent to the expected location of the right vesicoureteral junction has not changed in positioning since the prior study dated 05/27/2017 and therefore is favored to represent a phleboliths rather than a distal ureteral calculus. Stomach/Bowel: Stomach is within normal limits. Appendix appears normal. No evidence of bowel wall thickening, distention, or inflammatory changes. Vascular/Lymphatic: No significant vascular findings are present. No enlarged abdominal or pelvic lymph nodes.  Reproductive: 16 mm fundal fibroid noted. Otherwise normal appearance of the uterus and ovaries. Other: No abdominal wall hernia or abnormality. No abdominopelvic ascites. Musculoskeletal: No acute or significant osseous findings. IMPRESSION: 6 mm nonobstructive calculus in the lower pole of the right kidney. Previously demonstrated equivocal 3 mm calculus, adjacent to the expected location of the right vesicoureteral junction has not changed in positioning since the prior study dated 05/27/2017 and therefore is favored to represent a phleboliths rather than a distal ureteral calculus. There is no evidence of right obstructive uropathy. 16 mm subserosal fundal uterine fibroid. Electronically Signed   By: Fidela Salisbury M.D.   On: 12/11/2017 16:52   US Pelvis Transvanginal Non-ob (tv Only) Result Date: 12/11/2017 CLINICAL DATA:  42 year old female with right flank/pelvic pain. EXAM: TRANSABDOMINAL AND TRANSVAGINAL ULTRASOUND OF PELVIS DOPPLER ULTRASOUND OF OVARIES TECHNIQUE: Both transabdominal and transvaginal ultrasound examinations of the pelvis were performed. Transabdominal technique was performed for global imaging of the pelvis including uterus, ovaries, adnexal regions, and pelvic cul-de-sac. It was necessary to proceed with endovaginal exam following the transabdominal exam to visualize the endometrium and ovaries. Color and duplex Doppler ultrasound was utilized to evaluate blood flow to the ovaries. COMPARISON:  CT of the abdomen pelvis dated 12/11/2017 FINDINGS: Uterus Measurements: 9.9 x 5.8 x 7.9 cm. The uterus is anteverted. There is a 2.7 x 1.8 x 3.0 cm heterogeneous predominantly hypoechoic mass with internal vascularity in the right uterine body most consistent with fibroid. This likely corresponds to the 9 mm fibroid seen on the ultrasound of 10/27/2010. Endometrium Thickness: 8 mm.  No focal abnormality visualized. Right ovary Measurements: Fibroid 3 x 2.4 x 3.2 cm. There is a complex  cystic structure measuring 2.2 x 1.8 x 2.1 cm with no internal vascularity most consistent with hemorrhagic cyst or corpus luteum. Left ovary Measurements: 2.4 x 1.9 x 1.8 cm. Normal appearance/no adnexal mass. Pulsed Doppler evaluation of both ovaries demonstrates normal low-resistance arterial and venous waveforms. Other findings Small free fluid in the pelvis. IMPRESSION: 1. Right ovarian hemorrhagic cyst/corpus luteum. 2. Doppler detected arterial and venous flow to both ovaries. 3. Right uterine body intramural fibroid with increase in size since the ultrasound of 2012. Electronically Signed   By: Anner Crete M.D.   On: 12/11/2017 21:23     1730:  Pt now c/o right pelvic pain. Pt remedicated for pain and US pelvis ordered. Pelvic exam performed with permission of pt and female ED RN assist during exam.  External genitalia w/o lesions. Vaginal vault with scant white discharge.  Cervix w/o lesions, not friable, GC/chlam and  wet prep obtained and sent to lab.  Bimanual exam w/o CMT, uterine or left adnexal tenderness, +right pelvic tenderness.   2140:  [place] with ovarian cyst. Wet prep with BV. Will tx pain meds, flagyl, f/u OB/GYN MD. Pt states she is ready to go home now. Dx and testing d/w pt.  Questions answered.  Verb understanding, agreeable to d/c home with outpt f/u.      Final Clinical Impressions(s) / ED Diagnoses   Final diagnoses:  None    ED Discharge Orders    None       Francine Graven, DO 12/16/17 1219

## 2017-12-11 NOTE — ED Triage Notes (Signed)
Pt reports R sided flank pain with dark urine and dysuria.

## 2017-12-11 NOTE — Discharge Instructions (Addendum)
Take the prescriptions as directed.  Apply moist heat to the area(s) of discomfort, for 15 minutes at a time, several times per day for the next few days.  Do not fall asleep on a heating pack.  Call your regular OB/GYN doctor on Monday to schedule a follow up appointment this week.  Return to the Emergency Department immediately if worsening.

## 2017-12-11 NOTE — ED Notes (Signed)
Patient transported to Ultrasound 

## 2017-12-13 LAB — GC/CHLAMYDIA PROBE AMP (~~LOC~~) NOT AT ARMC
Chlamydia: NEGATIVE
Neisseria Gonorrhea: NEGATIVE

## 2018-03-12 ENCOUNTER — Encounter (HOSPITAL_COMMUNITY): Payer: Self-pay | Admitting: Emergency Medicine

## 2018-03-12 ENCOUNTER — Emergency Department (HOSPITAL_COMMUNITY): Payer: Self-pay

## 2018-03-12 ENCOUNTER — Emergency Department (HOSPITAL_COMMUNITY)
Admission: EM | Admit: 2018-03-12 | Discharge: 2018-03-12 | Disposition: A | Discharge status: Home / Self Care | Payer: Self-pay | Attending: Emergency Medicine | Admitting: Emergency Medicine

## 2018-03-12 ENCOUNTER — Other Ambulatory Visit: Payer: Self-pay

## 2018-03-12 DIAGNOSIS — F17210 Nicotine dependence, cigarettes, uncomplicated: Secondary | ICD-10-CM | POA: Insufficient documentation

## 2018-03-12 DIAGNOSIS — Z79899 Other long term (current) drug therapy: Secondary | ICD-10-CM | POA: Insufficient documentation

## 2018-03-12 DIAGNOSIS — N3001 Acute cystitis with hematuria: Secondary | ICD-10-CM | POA: Insufficient documentation

## 2018-03-12 DIAGNOSIS — N201 Calculus of ureter: Secondary | ICD-10-CM | POA: Insufficient documentation

## 2018-03-12 LAB — CBC WITH DIFFERENTIAL/PLATELET
Basophils Absolute: 0 10*3/uL (ref 0.0–0.1)
Basophils Relative: 0 %
Eosinophils Absolute: 0.2 10*3/uL (ref 0.0–0.7)
Eosinophils Relative: 2 %
HCT: 42.9 % (ref 36.0–46.0)
Hemoglobin: 14.8 g/dL (ref 12.0–15.0)
Lymphocytes Relative: 26 %
Lymphs Abs: 3.2 10*3/uL (ref 0.7–4.0)
MCH: 32.5 pg (ref 26.0–34.0)
MCHC: 34.5 g/dL (ref 30.0–36.0)
MCV: 94.1 fL (ref 78.0–100.0)
Monocytes Absolute: 1.1 10*3/uL — ABNORMAL HIGH (ref 0.1–1.0)
Monocytes Relative: 9 %
Neutro Abs: 7.9 10*3/uL — ABNORMAL HIGH (ref 1.7–7.7)
Neutrophils Relative %: 63 %
Platelets: 253 10*3/uL (ref 150–400)
RBC: 4.56 MIL/uL (ref 3.87–5.11)
RDW: 12.5 % (ref 11.5–15.5)
WBC: 12.4 10*3/uL — ABNORMAL HIGH (ref 4.0–10.5)

## 2018-03-12 LAB — WET PREP, GENITAL
Clue Cells Wet Prep HPF POC: NONE SEEN
Sperm: NONE SEEN
Trich, Wet Prep: NONE SEEN
WBC, Wet Prep HPF POC: NONE SEEN
Yeast Wet Prep HPF POC: NONE SEEN

## 2018-03-12 LAB — URINALYSIS, ROUTINE W REFLEX MICROSCOPIC
Bacteria, UA: NONE SEEN
Bilirubin Urine: NEGATIVE
Glucose, UA: 50 mg/dL — AB
Ketones, ur: NEGATIVE mg/dL
Leukocytes, UA: NEGATIVE
Nitrite: POSITIVE — AB
Protein, ur: >=300 mg/dL — AB
RBC / HPF: >50 RBC/hpf — ABNORMAL HIGH (ref 0–5)
Specific Gravity, Urine: 1.034 — ABNORMAL HIGH (ref 1.005–1.030)
WBC, UA: >50 WBC/hpf — ABNORMAL HIGH (ref 0–5)
pH: 6 (ref 5.0–8.0)

## 2018-03-12 LAB — COMPREHENSIVE METABOLIC PANEL
ALT: 18 U/L (ref 0–44)
AST: 17 U/L (ref 15–41)
Albumin: 4.1 g/dL (ref 3.5–5.0)
Alkaline Phosphatase: 71 U/L (ref 38–126)
Anion gap: 9 (ref 5–15)
BUN: 16 mg/dL (ref 6–20)
CO2: 20 mmol/L — ABNORMAL LOW (ref 22–32)
Calcium: 10.2 mg/dL (ref 8.9–10.3)
Chloride: 109 mmol/L (ref 98–111)
Creatinine, Ser: 1.1 mg/dL — ABNORMAL HIGH (ref 0.44–1.00)
GFR calc Af Amer: >60 mL/min (ref >60)
GFR calc non Af Amer: >60 mL/min (ref >60)
Glucose, Bld: 112 mg/dL — ABNORMAL HIGH (ref 70–99)
Potassium: 4 mmol/L (ref 3.5–5.1)
Sodium: 138 mmol/L (ref 135–145)
Total Bilirubin: 0.7 mg/dL (ref 0.3–1.2)
Total Protein: 7.9 g/dL (ref 6.5–8.1)

## 2018-03-12 LAB — I-STAT BETA HCG BLOOD, ED (MC, WL, AP ONLY): I-stat hCG, quantitative: <5 m[IU]/mL (ref <5)

## 2018-03-12 LAB — LIPASE, BLOOD: Lipase: 29 U/L (ref 11–51)

## 2018-03-12 LAB — I-STAT CG4 LACTIC ACID, ED: Lactic Acid, Venous: 0.52 mmol/L (ref 0.5–1.9)

## 2018-03-12 MED ORDER — SODIUM CHLORIDE 0.9 % IV SOLN
1.0000 g | Freq: Once | INTRAVENOUS | Status: AC
  Administered 2018-03-12: 1 g via INTRAVENOUS
  Filled 2018-03-12: qty 10

## 2018-03-12 MED ORDER — HYDROMORPHONE HCL 1 MG/ML IJ SOLN
1.0000 mg | Freq: Once | INTRAMUSCULAR | Status: AC
  Administered 2018-03-12: 1 mg via INTRAVENOUS
  Filled 2018-03-12: qty 1

## 2018-03-12 MED ORDER — ONDANSETRON HCL 4 MG/2ML IJ SOLN
4.0000 mg | Freq: Once | INTRAMUSCULAR | Status: AC
  Administered 2018-03-12: 4 mg via INTRAVENOUS
  Filled 2018-03-12: qty 2

## 2018-03-12 MED ORDER — ONDANSETRON HCL 4 MG PO TABS: 4.0000 mg | ORAL_TABLET | Freq: Three times a day (TID) | ORAL | 0 refills | Status: DC | PRN

## 2018-03-12 MED ORDER — CEPHALEXIN 500 MG PO CAPS: 500.0000 mg | ORAL_CAPSULE | Freq: Four times a day (QID) | ORAL | 0 refills | Status: DC

## 2018-03-12 MED ORDER — TAMSULOSIN HCL 0.4 MG PO CAPS: 0.4000 mg | ORAL_CAPSULE | Freq: Every day | ORAL | 0 refills | Status: AC

## 2018-03-12 MED ORDER — MORPHINE SULFATE (PF) 4 MG/ML IV SOLN
6.0000 mg | Freq: Once | INTRAVENOUS | Status: AC
  Administered 2018-03-12: 6 mg via INTRAVENOUS
  Filled 2018-03-12: qty 2

## 2018-03-12 MED ORDER — OXYCODONE-ACETAMINOPHEN 5-325 MG PO TABS: 2.0000 | ORAL_TABLET | ORAL | 0 refills | Status: AC | PRN

## 2018-03-12 MED ORDER — SODIUM CHLORIDE 0.9 % IV SOLN
INTRAVENOUS | Status: DC
  Administered 2018-03-12: 09:00:00 via INTRAVENOUS

## 2018-03-12 NOTE — ED Provider Notes (Addendum)
[facility] EMERGENCY DEPARTMENT Provider Note   CSN: [id number] Arrival date & time: 03/12/18  0801     History   Chief Complaint Chief Complaint  Patient presents with  . Flank Pain    HPI Wendy Singh is a 42 y.o. female with h/o bilateral salpingectomy, kidney stones, ovarian cysts is here for sudden, severe, constant, sharp right low abdominal pain onset 0300 today.  Initially went away for 10 mins after taking ibuprofen but now constant.  Radiates to right groin and right low back.  Associated with nausea, vomiting, sweats. She is on day 3 of her menstrual cycle. Known right sided kidney stone and right ovarian cyst.  No other abdominal surgeries. Denies recent fevers, dysuria, abnormal vaginal discharge or bleeding. She is in a monogamous relationship with one female partner without condom use.   HPI  Past Medical History:  Diagnosis Date  . Depression   . Kidney stones   . Migraines   . Ovarian cyst   . Vaginal Pap smear, abnormal     Patient Active Problem List   Diagnosis Date Noted  . 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 HSIL (high grade squamous intraepithelial lesion) on Pap smear of cervix 01/09/2014    Past Surgical History:  Procedure Laterality Date  . BREAST BIOPSY  05/2013  . CERVICAL BIOPSY  W/ LOOP ELECTRODE EXCISION    . CERVICAL CONIZATION W/BX N/A 09/26/2014   Procedure: CONIZATION CERVIX WITH BIOPSY;  Surgeon: Emily Filbert, MD;  Location: [facility];  Service: Gynecology;  Laterality: N/A;  . LAPAROSCOPIC BILATERAL SALPINGECTOMY Bilateral 09/26/2014   Procedure: LAPAROSCOPIC BILATERAL SALPINGECTOMY;  Surgeon: Emily Filbert, MD;  Location: [facility];  Service: Gynecology;  Laterality: Bilateral;     OB History    Gravida  3   Para  2   Term  2   Preterm      AB  1   Living  2     SAB  1   TAB      Ectopic      Multiple      Live Births               Home Medications    Prior to Admission medications   Medication Sig  Start Date End Date Taking? Authorizing Provider  cephALEXin (KEFLEX) 500 MG capsule Take 1 capsule (500 mg total) by mouth 4 (four) times daily. 03/12/18   Kinnie Feil, PA-C  metroNIDAZOLE (FLAGYL) 500 MG tablet Take 1 tablet (500 mg total) by mouth 2 (two) times daily. Patient not taking: Reported on 03/12/2018 12/11/17   Francine Graven, DO  ondansetron (ZOFRAN) 4 MG tablet Take 1 tablet (4 mg total) by mouth every 8 (eight) hours as needed for nausea or vomiting. 03/12/18   Kinnie Feil, PA-C  oxyCODONE-acetaminophen (PERCOCET/ROXICET) 5-325 MG tablet Take 2 tablets by mouth every 4 (four) hours as needed for up to 2 days for severe pain. 03/12/18 03/14/18  Kinnie Feil, PA-C  tamsulosin (FLOMAX) 0.4 MG CAPS capsule Take 1 capsule (0.4 mg total) by mouth daily for 15 days. 03/12/18 03/27/18  Kinnie Feil, PA-C    Family History Family History  Problem Relation Age of Onset  . Hypertension Father   . Diabetes Brother     Social History Social History   Tobacco Use  . Smoking status: Current Every Day Smoker    Packs/day: 0.25    Years: 10.00    Pack years:  2.50    Types: Cigarettes  . Smokeless tobacco: Never Used  Substance Use Topics  . Alcohol use: No  . Drug use: No     Allergies   Penicillins   Review of Systems Review of Systems  Constitutional: Positive for diaphoresis.  Gastrointestinal: Positive for abdominal pain, nausea and vomiting.  Genitourinary: Positive for vaginal bleeding (currently on menstrual cycle).  All other systems reviewed and are negative.    Physical Exam Updated Vital Signs BP 131/78   Pulse 64   Temp 98.3 F (36.8 C) (Oral)   Resp 18   Ht 6' (1.829 m)   Wt 103 kg   LMP 03/09/2018   SpO2 99%   BMI 30.80 kg/m   Physical Exam  Constitutional: She is oriented to person, place, and time. She appears well-developed and well-nourished. She appears distressed.  Looks uncomfortable, in tears, diaphoretic.     HENT:  Head: Normocephalic and atraumatic.  Nose: Nose normal.  Eyes: Pupils are equal, round, and reactive to light. Conjunctivae and EOM are normal.  Neck: Normal range of motion.  Cardiovascular: Normal rate and regular rhythm.  Pulmonary/Chest: Effort normal and breath sounds normal.  Abdominal: Soft. Bowel sounds are normal. There is tenderness.  Moderate tenderness to RLQ, right below umbilicus. Positive McBurney's. Mild suprapubic tenderness. No G/R/R. No CVA tenderness. Negative Murphy's. Active BS to lower quadrants.   Genitourinary: Rectal exam shows external hemorrhoid. There is bleeding in the vagina.  Genitourinary Comments:  External genitalia normal without lesions.  No groin lymphadenopathy.  Vaginal mucosa pink without lesions.  Moderate amount of blood in vaginal vault and through cervix.  Scars noted around cervix (history of conization), cervix is closed No CMT. Non palpable, non tender adnexa.   Musculoskeletal: Normal range of motion.  Neurological: She is alert and oriented to person, place, and time.  Skin: Skin is warm and dry. Capillary refill takes less than 2 seconds.  Psychiatric: She has a normal mood and affect. Her behavior is normal.  Nursing note and vitals reviewed.    ED Treatments / Results  Labs (all labs ordered are listed, but only abnormal results are displayed) Labs Reviewed  URINALYSIS, ROUTINE W REFLEX MICROSCOPIC - Abnormal; Notable for the following components:      Result Value   Color, Urine AMBER (*)    APPearance CLOUDY (*)    Specific Gravity, Urine 1.034 (*)    Glucose, UA 50 (*)    Hgb urine dipstick LARGE (*)    Protein, ur >=300 (*)    Nitrite POSITIVE (*)    RBC / HPF >50 (*)    WBC, UA >50 (*)    All other components within normal limits  COMPREHENSIVE METABOLIC PANEL - Abnormal; Notable for the following components:   CO2 20 (*)    Glucose, Bld 112 (*)    Creatinine, Ser 1.10 (*)    All other components within  normal limits  CBC WITH DIFFERENTIAL/PLATELET - Abnormal; Notable for the following components:   WBC 12.4 (*)    Neutro Abs 7.9 (*)    Monocytes Absolute 1.1 (*)    All other components within normal limits  WET PREP, GENITAL  CULTURE, BLOOD (ROUTINE X 2)  CULTURE, BLOOD (ROUTINE X 2)  LIPASE, BLOOD  I-STAT BETA HCG BLOOD, ED ([facility], [facility], [facility] ONLY)  I-STAT CG4 LACTIC ACID, ED  GC/CHLAMYDIA PROBE AMP ([place]) NOT AT [facility]    EKG None  Radiology Ct Renal Stone Study  Result Date: 03/12/2018 CLINICAL DATA:  Sudden right lower quadrant abdominal and flank pain. History of renal stones. EXAM: CT ABDOMEN AND PELVIS WITHOUT CONTRAST TECHNIQUE: Multidetector CT imaging of the abdomen and pelvis was performed following the standard protocol without IV contrast. COMPARISON:  12/11/2017; 05/27/2017 FINDINGS: The lack of intravenous contrast limits the ability to evaluate solid abdominal organs. Lower chest: Limited visualization of the lower thorax demonstrates minimal dependent subpleural ground-glass atelectasis. No discrete focal airspace opacities. Normal heart size.  No pericardial effusion. Hepatobiliary: Normal hepatic contour. Scattered punctate subcentimeter hypoattenuating hepatic lesions are too small to accurately characterize though likely represent hepatic cysts. Normal noncontrast appearance of the gallbladder given degree of distention. No radiopaque gallstones. No ascites. Pancreas: Normal noncontrast appearance the pancreas Spleen: Normal noncontrast appearance of the spleen Adrenals/Urinary Tract: There is a punctate (approximately 4 mm) stone within the distal aspect of the right ureter at the level of the right UVJ (axial image 85, series 2; coronal image 67, series 5) which results in moderate upstream ureterectasis, pelvicaliectasis and mild asymmetric right-sided perinephric stranding. No additional renal stones identified. No evidence of left-sided urinary obstruction. No renal  stones are seen along expected course of the left ureter. Normal noncontrast appearance of the urinary bladder given underdistention. There is mild thickening of the medial limb of the left adrenal gland without discrete nodule. Normal noncontrast appearance of the right adrenal gland. Stomach/Bowel: Moderate colonic stool burden without evidence of enteric obstruction. Normal noncontrast appearance of the terminal ileum and retrocecal appendix. No pneumoperitoneum, pneumatosis or portal venous gas. Vascular/Lymphatic: Normal caliber of the abdominal aorta. No bulky retroperitoneal, mesenteric, pelvic or inguinal lymphadenopathy on this noncontrast examination. Reproductive: There is a approximately 1.5 x 0.9 cm partially exophytic presumed fibroid arising from the anterior aspect of the uterine fundus (sagittal image 58, series 6). Normal noncontrast appearance of the bilateral adnexa. No free fluid in the pelvic cul-de-sac. Other: Regional soft tissues appear normal. Musculoskeletal: No acute or aggressive osseous abnormalities. Stigmata of DISH within the thoracic spine. Mild-to-moderate multilevel lumbar spine DDD, worse at L5-S1 with disc space height loss, endplate irregularity and sclerosis. Punctate bone island within the right ilium. IMPRESSION: 1. Punctate (approximately 4 mm) stone within the distal aspect of the right ureter at the level of the right UVJ results in moderate upstream ureterectasis and pelvicaliectasis. 2. No additional renal stones identified. No evidence of left-sided urinary obstruction. Electronically Signed   By: Sandi Mariscal M.D.   On: 03/12/2018 11:20    Procedures Procedures (including critical care time)  Medications Ordered in ED Medications  0.9 %  sodium chloride infusion ( Intravenous New Bag/Given 03/12/18 0834)  morphine 4 MG/ML injection 6 mg (6 mg Intravenous Given 03/12/18 0832)  ondansetron (ZOFRAN) injection 4 mg (4 mg Intravenous Given 03/12/18 0832)    HYDROmorphone (DILAUDID) injection 1 mg (1 mg Intravenous Given 03/12/18 0936)  cefTRIAXone (ROCEPHIN) 1 g in sodium chloride 0.9 % 100 mL IVPB (0 g Intravenous Stopped 03/12/18 1139)     Initial Impression / Assessment and Plan / ED Course  I have reviewed the triage vital signs and the nursing notes.  Pertinent labs & imaging results that were available during my care of the patient were reviewed by me and considered in my medical decision making (see chart for details).  Clinical Course as of Mar 12 1157  Sat Mar 12, 2018  5093 Nitrite(!): POSITIVE [CG]  0901 WBC(!): 12.4 [CG]  1132 IMPRESSION: 1. Punctate (approximately 4 mm) stone  within the distal aspect of the right ureter at the level of the right UVJ results in moderate upstream ureterectasis and pelvicaliectasis. 2. No additional renal stones identified. No evidence of left-sided urinary obstruction.  CT Renal Laren Everts [CG]  1133 Reproductive: There is a approximately 1.5 x 0.9 cm partially exophytic presumed fibroid arising from the anterior aspect of the uterine fundus (sagittal image 58, series 6). Normal noncontrast appearance of the bilateral adnexa. No free fluid in the pelvic cul-de-sac.  CT Renal Stone Study [CG]  4503 Spoke to Dr Carlis Abbott with urology who reviewed pt's chart. Deems pt appropriate for discharge with flomax, keflex, pain control, strict return precautions. Pt to go to [facility] for worsening symptoms.    [CG]    Clinical Course User Index [CG] Kinnie Feil, PA-C    Female with RLQ abdominal pain, nausea, vomiting, sudden, severe, constant radiating to right low back.  H/o bilateral salpingectomy, R kidney stone and ovarian cyst.  She is uncomfortable appearing but non toxic, afebrile with RLQ tenderness, +mcburney's, suprapubic tenderness.  No CVAT or urinary symptoms. Considering appendicitis vs renal stone vs UTI/pyelo > ruptured hemorrhagic cyst vs ovarian torsion vs PID. Will treat symptoms,  obtain screening labs and plan on pelvic to help differential pelvis vs abdominal.   1200: pelvic reassuring making pelvic etiology less likely.  UA with nitrite positive UTI. CT renal confirms small stone almost at bladder with upstream inflammatory changes.  Pt pain has improved. She is voiding urine. Tolerating PO.  Afebrile. Lactic acid normal.  Spoke to urology who deems pt appropriate for outpatient management.  Will dc with keflex, zofran, percocet, flomax. Instructed to go to [facility] ER for worsening symptoms. She is to otherwise f/u with urology within 1 week. Pt is comfortable with this plan. Blood and urine cultures sent.  Final Clinical Impressions(s) / ED Diagnoses   Final diagnoses:  Acute cystitis with hematuria  Right ureteral stone    ED Discharge Orders         Ordered    cephALEXin (KEFLEX) 500 MG capsule  4 times daily     03/12/18 1155    tamsulosin (FLOMAX) 0.4 MG CAPS capsule  Daily     03/12/18 1155    oxyCODONE-acetaminophen (PERCOCET/ROXICET) 5-325 MG tablet  Every 4 hours PRN     03/12/18 1155    ondansetron (ZOFRAN) 4 MG tablet  Every 8 hours PRN     03/12/18 1155            Kinnie Feil, [place] 03/12/18 1159    Julianne Rice, MD 03/13/18 1233

## 2018-03-12 NOTE — Discharge Instructions (Signed)
You were seen in the ER for right lower abdominal pain.  You have a urine infection and a 4 mm stone that is almost at the bladder.  Dr. Carlis Abbott with [facility] recommends discharge with medications to help you pass the stone.  Take antibiotics as prescribed and until completed.  Take tamsulosin daily to help dilate the ureter.  Zofran for nausea.  Percocet for pain.  For more additional pain control he can take 650 mg of acetaminophen (Tylenol) every 6 hours.  Stay well-hydrated.  Go to [facility], ER for worsening symptoms, fevers greater than 100.4, persistent nausea, difficulty urinating.

## 2018-03-12 NOTE — ED Triage Notes (Signed)
Pt reports sudden right lower abd pain going into right flank at 0300 waking from sleep.

## 2018-03-14 LAB — GC/CHLAMYDIA PROBE AMP (~~LOC~~) NOT AT ARMC
Chlamydia: NEGATIVE
Neisseria Gonorrhea: NEGATIVE

## 2018-03-15 ENCOUNTER — Other Ambulatory Visit: Payer: Self-pay

## 2018-03-15 ENCOUNTER — Emergency Department (HOSPITAL_COMMUNITY)
Admission: EM | Admit: 2018-03-15 | Discharge: 2018-03-15 | Disposition: A | Discharge status: Home / Self Care | Payer: Self-pay | Attending: Emergency Medicine | Admitting: Emergency Medicine

## 2018-03-15 ENCOUNTER — Encounter (HOSPITAL_COMMUNITY): Payer: Self-pay | Admitting: Emergency Medicine

## 2018-03-15 ENCOUNTER — Emergency Department (HOSPITAL_COMMUNITY): Payer: Self-pay

## 2018-03-15 DIAGNOSIS — F17210 Nicotine dependence, cigarettes, uncomplicated: Secondary | ICD-10-CM | POA: Insufficient documentation

## 2018-03-15 DIAGNOSIS — Z79899 Other long term (current) drug therapy: Secondary | ICD-10-CM | POA: Insufficient documentation

## 2018-03-15 DIAGNOSIS — N201 Calculus of ureter: Secondary | ICD-10-CM | POA: Insufficient documentation

## 2018-03-15 LAB — BASIC METABOLIC PANEL
Anion gap: 6 (ref 5–15)
BUN: 10 mg/dL (ref 6–20)
CO2: 26 mmol/L (ref 22–32)
Calcium: 9.9 mg/dL (ref 8.9–10.3)
Chloride: 108 mmol/L (ref 98–111)
Creatinine, Ser: 0.86 mg/dL (ref 0.44–1.00)
GFR calc Af Amer: >60 mL/min (ref >60)
GFR calc non Af Amer: >60 mL/min (ref >60)
Glucose, Bld: 94 mg/dL (ref 70–99)
Potassium: 3.9 mmol/L (ref 3.5–5.1)
Sodium: 140 mmol/L (ref 135–145)

## 2018-03-15 LAB — CBC WITH DIFFERENTIAL/PLATELET
Basophils Absolute: 0 10*3/uL (ref 0.0–0.1)
Basophils Relative: 0 %
Eosinophils Absolute: 0.3 10*3/uL (ref 0.0–0.7)
Eosinophils Relative: 4 %
HCT: 40.1 % (ref 36.0–46.0)
Hemoglobin: 13.4 g/dL (ref 12.0–15.0)
Lymphocytes Relative: 35 %
Lymphs Abs: 2.7 10*3/uL (ref 0.7–4.0)
MCH: 31.9 pg (ref 26.0–34.0)
MCHC: 33.4 g/dL (ref 30.0–36.0)
MCV: 95.5 fL (ref 78.0–100.0)
Monocytes Absolute: 0.8 10*3/uL (ref 0.1–1.0)
Monocytes Relative: 10 %
Neutro Abs: 4 10*3/uL (ref 1.7–7.7)
Neutrophils Relative %: 51 %
Platelets: 204 10*3/uL (ref 150–400)
RBC: 4.2 MIL/uL (ref 3.87–5.11)
RDW: 12.4 % (ref 11.5–15.5)
WBC: 7.8 10*3/uL (ref 4.0–10.5)

## 2018-03-15 LAB — URINALYSIS, ROUTINE W REFLEX MICROSCOPIC
Bacteria, UA: NONE SEEN
Bilirubin Urine: NEGATIVE
Glucose, UA: NEGATIVE mg/dL
Ketones, ur: NEGATIVE mg/dL
Leukocytes, UA: NEGATIVE
Nitrite: NEGATIVE
Protein, ur: NEGATIVE mg/dL
Specific Gravity, Urine: 1.017 (ref 1.005–1.030)
pH: 5 (ref 5.0–8.0)

## 2018-03-15 MED ORDER — ONDANSETRON HCL 4 MG/2ML IJ SOLN
4.0000 mg | Freq: Once | INTRAMUSCULAR | Status: AC
  Administered 2018-03-15: 4 mg via INTRAVENOUS
  Filled 2018-03-15: qty 2

## 2018-03-15 MED ORDER — OXYCODONE-ACETAMINOPHEN 5-325 MG PO TABS: 1.0000 | ORAL_TABLET | ORAL | 0 refills | Status: DC | PRN

## 2018-03-15 MED ORDER — MORPHINE SULFATE (PF) 4 MG/ML IV SOLN
4.0000 mg | Freq: Once | INTRAVENOUS | Status: AC
  Administered 2018-03-15: 4 mg via INTRAVENOUS
  Filled 2018-03-15: qty 1

## 2018-03-15 MED ORDER — PROMETHAZINE HCL 25 MG/ML IJ SOLN
12.5000 mg | Freq: Once | INTRAMUSCULAR | Status: AC
  Administered 2018-03-15: 12.5 mg via INTRAVENOUS
  Filled 2018-03-15: qty 1

## 2018-03-15 MED ORDER — SODIUM CHLORIDE 0.9 % IV BOLUS
1000.0000 mL | Freq: Once | INTRAVENOUS | Status: AC
  Administered 2018-03-15: 1000 mL via INTRAVENOUS

## 2018-03-15 MED ORDER — PROMETHAZINE HCL 25 MG RE SUPP: 25.0000 mg | Freq: Four times a day (QID) | RECTAL | 0 refills | Status: DC | PRN

## 2018-03-15 MED ORDER — FENTANYL CITRATE (PF) 100 MCG/2ML IJ SOLN
50.0000 ug | Freq: Once | INTRAMUSCULAR | Status: AC
  Administered 2018-03-15: 50 ug via INTRAVENOUS
  Filled 2018-03-15: qty 2

## 2018-03-15 NOTE — ED Notes (Signed)
Pt verbalized understanding of no driving and to use caution within 4 hours of taking pain meds due to meds cause drowsiness 

## 2018-03-15 NOTE — ED Triage Notes (Signed)
Pt seen here on 9/28 diagnosed with kidney stone on right side and uti. Pt states pain and nausea meds at home are not helping.

## 2018-03-15 NOTE — Discharge Instructions (Addendum)
Use the phenergan suppository if the zofran you were prescribed is not controlling your nausea as discussed.  Continue taking the antibiotic and the pain medicine as prescribed.

## 2018-03-15 NOTE — ED Notes (Signed)
Pt with constant pain to right flank area and abd pain to right lower quadrant.  Pt tried to eat earlier this morning and vomited x 1

## 2018-03-15 NOTE — ED Provider Notes (Signed)
[facility] EMERGENCY DEPARTMENT Provider Note   CSN: [id number] Arrival date & time: 03/15/18  1034     History   Chief Complaint Chief Complaint  Patient presents with  . Flank Pain    HPI Wendy Singh is a 42 y.o. female with a history of kidney stones and ovarian cysts and was seen here 3 days ago and diagnosed with a 4 mm right UVJ ureteral stone per CT and a UTI presents with persistent pain, nausea, vomiting, inability to keep any oral meds down along with fever to 100 and worsening right flank pain.  She was prescribed keflex, zofran, oxycodone and flomax but as mentioned, cannot maintain these medicines.  She denies prior need for surgical intervention for stone passage.  HPI  Past Medical History:  Diagnosis Date  . Depression   . Kidney stones   . Migraines   . Ovarian cyst   . Vaginal Pap smear, abnormal     Patient Active Problem List   Diagnosis Date Noted  . 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 HSIL (high grade squamous intraepithelial lesion) on Pap smear of cervix 01/09/2014    Past Surgical History:  Procedure Laterality Date  . BREAST BIOPSY  05/2013  . CERVICAL BIOPSY  W/ LOOP ELECTRODE EXCISION    . CERVICAL CONIZATION W/BX N/A 09/26/2014   Procedure: CONIZATION CERVIX WITH BIOPSY;  Surgeon: Emily Filbert, MD;  Location: [facility];  Service: Gynecology;  Laterality: N/A;  . LAPAROSCOPIC BILATERAL SALPINGECTOMY Bilateral 09/26/2014   Procedure: LAPAROSCOPIC BILATERAL SALPINGECTOMY;  Surgeon: Emily Filbert, MD;  Location: [facility];  Service: Gynecology;  Laterality: Bilateral;     OB History    Gravida  3   Para  2   Term  2   Preterm      AB  1   Living  2     SAB  1   TAB      Ectopic      Multiple      Live Births               Home Medications    Prior to Admission medications   Medication Sig Start Date End Date Taking? Authorizing Provider  cephALEXin (KEFLEX) 500 MG capsule Take 1 capsule (500 mg total) by mouth  4 (four) times daily. 03/12/18  Yes Carmon Sails J, PA-C  ondansetron (ZOFRAN) 4 MG tablet Take 1 tablet (4 mg total) by mouth every 8 (eight) hours as needed for nausea or vomiting. 03/12/18  Yes Kinnie Feil, PA-C  tamsulosin (FLOMAX) 0.4 MG CAPS capsule Take 1 capsule (0.4 mg total) by mouth daily for 15 days. 03/12/18 03/27/18 Yes Kinnie Feil, PA-C  metroNIDAZOLE (FLAGYL) 500 MG tablet Take 1 tablet (500 mg total) by mouth 2 (two) times daily. Patient not taking: Reported on 03/12/2018 12/11/17   Francine Graven, DO  oxyCODONE-acetaminophen (PERCOCET/ROXICET) 5-325 MG tablet Take 1 tablet by mouth every 4 (four) hours as needed. 03/15/18   Evalee Jefferson, PA-C  promethazine (PHENERGAN) 25 MG suppository Place 1 suppository (25 mg total) rectally every 6 (six) hours as needed for nausea or vomiting. 03/15/18   Evalee Jefferson, PA-C    Family History Family History  Problem Relation Age of Onset  . Hypertension Father   . Diabetes Brother     Social History Social History   Tobacco Use  . Smoking status: Current Every Day Smoker    Packs/day: 0.25    Years: 10.00  Pack years: 2.50    Types: Cigarettes  . Smokeless tobacco: Never Used  Substance Use Topics  . Alcohol use: No  . Drug use: No     Allergies   Penicillins   Review of Systems Review of Systems  Constitutional: Positive for fatigue and fever.  HENT: Negative for congestion and sore throat.   Eyes: Negative.   Respiratory: Negative for chest tightness and shortness of breath.   Cardiovascular: Negative for chest pain.  Gastrointestinal: Positive for nausea and vomiting. Negative for abdominal pain.  Genitourinary: Positive for flank pain and hematuria. Negative for decreased urine volume.  Musculoskeletal: Negative for arthralgias, joint swelling and neck pain.  Skin: Negative.  Negative for rash and wound.  Neurological: Negative for dizziness, weakness, light-headedness, numbness and headaches.    Psychiatric/Behavioral: Negative.      Physical Exam Updated Vital Signs BP (!) 113/98   Pulse 85   Temp 97.9 F (36.6 C) (Oral)   Resp 16   Wt 103 kg   LMP 03/09/2018   SpO2 100%   BMI 30.80 kg/m   Physical Exam  Constitutional: She appears well-developed and well-nourished.  HENT:  Head: Normocephalic and atraumatic.  Eyes: Conjunctivae are normal.  Neck: Normal range of motion.  Cardiovascular: Normal rate, regular rhythm, normal heart sounds and intact distal pulses.  Pulmonary/Chest: Effort normal and breath sounds normal. She has no wheezes.  Abdominal: Soft. Bowel sounds are normal. She exhibits no distension and no mass. There is no tenderness. There is CVA tenderness. There is no guarding.  Musculoskeletal: Normal range of motion.  Neurological: She is alert.  Skin: Skin is warm and dry.  Psychiatric: She has a normal mood and affect.  Nursing note and vitals reviewed.    ED Treatments / Results  Labs (all labs ordered are listed, but only abnormal results are displayed) Labs Reviewed  URINALYSIS, ROUTINE W REFLEX MICROSCOPIC - Abnormal; Notable for the following components:      Result Value   APPearance HAZY (*)    Hgb urine dipstick LARGE (*)    All other components within normal limits  URINE CULTURE  CBC WITH DIFFERENTIAL/PLATELET  BASIC METABOLIC PANEL    EKG None  Radiology Dg Abdomen 1 View  Result Date: 03/15/2018 CLINICAL DATA:  Right flank pain with known ureteral stone. EXAM: ABDOMEN - 1 VIEW COMPARISON:  CT abdomen and pelvis 03/12/2018 FINDINGS: A 4 mm pelvic calcification projecting over the right aspect of the bladder likely corresponds to the distal right ureteral/UVJ stone on CT. Additional calcifications more inferiorly in the pelvis bilaterally correspond to phleboliths. No radiopaque calculi are identified projecting over either kidney. There is a moderate amount of stool in the colon. There is no evidence of bowel obstruction. No  acute osseous abnormality is seen. IMPRESSION: 4 mm right UVJ stone, likely unchanged from recent CT. Electronically Signed   By: Logan Bores M.D.   On: 03/15/2018 12:52    Procedures Procedures (including critical care time)  Medications Ordered in ED Medications  sodium chloride 0.9 % bolus 1,000 mL (0 mLs Intravenous Stopped 03/15/18 1323)  ondansetron (ZOFRAN) injection 4 mg (4 mg Intravenous Given 03/15/18 1158)  morphine 4 MG/ML injection 4 mg (4 mg Intravenous Given 03/15/18 1159)  fentaNYL (SUBLIMAZE) injection 50 mcg (50 mcg Intravenous Given 03/15/18 1321)  promethazine (PHENERGAN) injection 12.5 mg (12.5 mg Intravenous Given 03/15/18 1415)     Initial Impression / Assessment and Plan / ED Course  I have reviewed the  triage vital signs and the nursing notes.  Pertinent labs & imaging results that were available during my care of the patient were reviewed by me and considered in my medical decision making (see chart for details).     Pt given IV fluids, zofran, morphine. N/V resolved, still with moderate right flank pain.  Fentanyl ordered.   KUB with persistent right UVJ stone. PT may need stenting given sx and no appreciable movement with this stone.   Call placed to Dr Louis Meckel with urology and hx and todays new results discussed.  Dr. Louis Meckel will have office contact pt for ov tomorrow. Pt was given fentanyl here with improved pain, nausea still present but no vomiting, added phenergan. Pt advised to continue home meds, add phenergan suppository if n/v persists.  Final Clinical Impressions(s) / ED Diagnoses   Final diagnoses:  Right ureteral stone    ED Discharge Orders         Ordered    promethazine (PHENERGAN) 25 MG suppository  Every 6 hours PRN     03/15/18 1442    oxyCODONE-acetaminophen (PERCOCET/ROXICET) 5-325 MG tablet  Every 4 hours PRN     03/15/18 1442           Evalee Jefferson, PA-C 03/15/18 1446    Long, [place], MD 03/15/18 1510

## 2018-03-17 LAB — CULTURE, BLOOD (ROUTINE X 2)
Culture: NO GROWTH
Culture: NO GROWTH
Special Requests: ADEQUATE
Special Requests: ADEQUATE

## 2018-03-17 LAB — URINE CULTURE: Culture: NO GROWTH

## 2018-05-13 ENCOUNTER — Emergency Department (HOSPITAL_COMMUNITY)
Admission: EM | Admit: 2018-05-13 | Discharge: 2018-05-14 | Disposition: A | Discharge status: Home / Self Care | Payer: Self-pay | Attending: Emergency Medicine | Admitting: Emergency Medicine

## 2018-05-13 ENCOUNTER — Encounter (HOSPITAL_COMMUNITY): Payer: Self-pay

## 2018-05-13 ENCOUNTER — Encounter (HOSPITAL_COMMUNITY): Payer: Self-pay | Admitting: Emergency Medicine

## 2018-05-13 ENCOUNTER — Other Ambulatory Visit: Payer: Self-pay

## 2018-05-13 ENCOUNTER — Emergency Department (HOSPITAL_COMMUNITY)
Admission: EM | Admit: 2018-05-13 | Discharge: 2018-05-13 | Discharge status: Against Medical Advice | Payer: Self-pay | Attending: Emergency Medicine | Admitting: Emergency Medicine

## 2018-05-13 DIAGNOSIS — Z79899 Other long term (current) drug therapy: Secondary | ICD-10-CM | POA: Insufficient documentation

## 2018-05-13 DIAGNOSIS — B9789 Other viral agents as the cause of diseases classified elsewhere: Secondary | ICD-10-CM

## 2018-05-13 DIAGNOSIS — F17210 Nicotine dependence, cigarettes, uncomplicated: Secondary | ICD-10-CM | POA: Insufficient documentation

## 2018-05-13 DIAGNOSIS — J988 Other specified respiratory disorders: Secondary | ICD-10-CM

## 2018-05-13 DIAGNOSIS — J069 Acute upper respiratory infection, unspecified: Secondary | ICD-10-CM | POA: Insufficient documentation

## 2018-05-13 DIAGNOSIS — Z5321 Procedure and treatment not carried out due to patient leaving prior to being seen by health care provider: Secondary | ICD-10-CM | POA: Insufficient documentation

## 2018-05-13 DIAGNOSIS — M791 Myalgia, unspecified site: Secondary | ICD-10-CM | POA: Insufficient documentation

## 2018-05-13 DIAGNOSIS — H60393 Other infective otitis externa, bilateral: Secondary | ICD-10-CM

## 2018-05-13 NOTE — ED Triage Notes (Signed)
Fever, cough, sore throat x several days, taking tylenol, dayquil and nyquil for symptoms.

## 2018-05-13 NOTE — ED Notes (Signed)
Called patient to place in room. No answer. Patient left after triage but before being placed in room or seen by physician.

## 2018-05-13 NOTE — ED Notes (Signed)
Called patient to place in room. No answer. 

## 2018-05-13 NOTE — ED Triage Notes (Addendum)
Pt reports generalized body aches, nausea, emesis, sore throat, chills since Monday. Pt reports has been taking otc medication with no relief.

## 2018-05-13 NOTE — ED Provider Notes (Signed)
[facility] EMERGENCY DEPARTMENT Provider Note   CSN: [id number] Arrival date & time: 05/13/18  2332     History   Chief Complaint Chief Complaint  Patient presents with  . Fever    HPI Wendy Singh is a 42 y.o. female.  The history is provided by the patient.  URI   This is a new problem. The current episode started more than 1 week ago. The problem has been gradually worsening. The maximum temperature recorded prior to her arrival was 102 to 102.9 F. Associated symptoms include nausea, congestion, ear pain, headaches, sinus pain, sore throat and cough. Pertinent negatives include no chest pain, no abdominal pain, no diarrhea, no vomiting, no dysuria, no neck pain and no wheezing. Treatments tried: tylenol. The treatment provided no relief.    Past Medical History:  Diagnosis Date  . Depression   . Kidney stones   . Migraines   . Ovarian cyst   . Vaginal Pap smear, abnormal     Patient Active Problem List   Diagnosis Date Noted  . 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 HSIL (high grade squamous intraepithelial lesion) on Pap smear of cervix 01/09/2014    Past Surgical History:  Procedure Laterality Date  . BREAST BIOPSY  05/2013  . CERVICAL BIOPSY  W/ LOOP ELECTRODE EXCISION    . CERVICAL CONIZATION W/BX N/A 09/26/2014   Procedure: CONIZATION CERVIX WITH BIOPSY;  Surgeon: Emily Filbert, MD;  Location: [facility];  Service: Gynecology;  Laterality: N/A;  . LAPAROSCOPIC BILATERAL SALPINGECTOMY Bilateral 09/26/2014   Procedure: LAPAROSCOPIC BILATERAL SALPINGECTOMY;  Surgeon: Emily Filbert, MD;  Location: [facility];  Service: Gynecology;  Laterality: Bilateral;     OB History    Gravida  3   Para  2   Term  2   Preterm      AB  1   Living  2     SAB  1   TAB      Ectopic      Multiple      Live Births               Home Medications    Prior to Admission medications   Medication Sig Start Date End Date Taking? Authorizing Provider    cephALEXin (KEFLEX) 500 MG capsule Take 1 capsule (500 mg total) by mouth 4 (four) times daily. 03/12/18   Kinnie Feil, PA-C  metroNIDAZOLE (FLAGYL) 500 MG tablet Take 1 tablet (500 mg total) by mouth 2 (two) times daily. Patient not taking: Reported on 03/12/2018 12/11/17   Francine Graven, DO  ondansetron (ZOFRAN) 4 MG tablet Take 1 tablet (4 mg total) by mouth every 8 (eight) hours as needed for nausea or vomiting. 03/12/18   Kinnie Feil, PA-C  oxyCODONE-acetaminophen (PERCOCET/ROXICET) 5-325 MG tablet Take 1 tablet by mouth every 4 (four) hours as needed. 03/15/18   Evalee Jefferson, PA-C  promethazine (PHENERGAN) 25 MG suppository Place 1 suppository (25 mg total) rectally every 6 (six) hours as needed for nausea or vomiting. 03/15/18   Evalee Jefferson, PA-C    Family History Family History  Problem Relation Age of Onset  . Hypertension Father   . Diabetes Brother     Social History Social History   Tobacco Use  . Smoking status: Current Every Day Smoker    Packs/day: 0.25    Years: 10.00    Pack years: 2.50    Types: Cigarettes  . Smokeless tobacco: Never Used  Substance  Use Topics  . Alcohol use: No  . Drug use: No     Allergies   Penicillins   Review of Systems Review of Systems  Constitutional: Positive for fever. Negative for activity change.       All ROS Neg except as noted in HPI  HENT: Positive for congestion, ear pain, sinus pain and sore throat. Negative for nosebleeds.   Eyes: Negative for photophobia and discharge.  Respiratory: Positive for cough. Negative for shortness of breath and wheezing.   Cardiovascular: Negative for chest pain and palpitations.  Gastrointestinal: Positive for nausea. Negative for abdominal pain, blood in stool, diarrhea and vomiting.  Genitourinary: Negative for dysuria, frequency and hematuria.  Musculoskeletal: Negative for arthralgias, back pain and neck pain.  Skin: Negative.   Neurological: Positive for headaches.  Negative for dizziness, seizures and speech difficulty.  Psychiatric/Behavioral: Negative for confusion and hallucinations.     Physical Exam Updated Vital Signs BP 101/87 (BP Location: Right Arm)   Pulse 75   Temp 98.1 F (36.7 C) (Oral)   Resp 20   Ht 5\' 11"  (1.803 m)   Wt 104.3 kg   LMP 05/05/2018   SpO2 99%   BMI 32.08 kg/m   Physical Exam  Constitutional: She is oriented to person, place, and time. She appears well-developed and well-nourished.  Non-toxic appearance.  HENT:  Head: Normocephalic.  Right Ear: Tympanic membrane and external ear normal.  Left Ear: Tympanic membrane and external ear normal.  Nasal congestion present. Scratch on the external auditory canal on the right. Scratch on the TM on the left. Fluid behind the TM on the left.  Eyes: Pupils are equal, round, and reactive to light. EOM and lids are normal.  Neck: Normal range of motion. Neck supple. Carotid bruit is not present.  Cardiovascular: Normal rate, regular rhythm, normal heart sounds, intact distal pulses and normal pulses.  Pulmonary/Chest: Breath sounds normal. No respiratory distress.  Abdominal: Soft. Bowel sounds are normal. There is no tenderness. There is no guarding.  Musculoskeletal: Normal range of motion.  Lymphadenopathy:       Head (right side): No submandibular adenopathy present.       Head (left side): No submandibular adenopathy present.    She has no cervical adenopathy.  Neurological: She is alert and oriented to person, place, and time. She has normal strength. No cranial nerve deficit or sensory deficit.  Skin: Skin is warm and dry.  Psychiatric: She has a normal mood and affect. Her speech is normal.  Nursing note and vitals reviewed.    ED Treatments / Results  Labs (all labs ordered are listed, but only abnormal results are displayed) Labs Reviewed - No data to display  EKG None  Radiology No results found.  Procedures Procedures (including critical care  time)  Medications Ordered in ED Medications - No data to display   Initial Impression / Assessment and Plan / ED Course  I have reviewed the triage vital signs and the nursing notes.  Pertinent labs & imaging results that were available during my care of the patient were reviewed by me and considered in my medical decision making (see chart for details).       Final Clinical Impressions(s) / ED Diagnoses MDM  Vital signs within normal limits.  Pulse oximetry is 99% on room air.  Within normal limits by my interpretation.  Patient has been using dry Q-tips in her ears, and has multiple scratches present with increased redness present.  The remainder  of examination shows upper respiratory type infection.  She has a lot of nasal congestion.  The lungs are mostly clear.  The patient speaks in complete sentences without problem.  The patient will be treated with a decongestant, short course of steroid, Tylenol every 4 hours, or ibuprofen every 6 hours for his body aches.  Patient will also be treated with Cortisporin otic for assistance with the scratches and early infection in the ear.  Patient is to see the primary physician or return to the emergency department if any changes in condition, problems, or concerns.   Final diagnoses:  Viral respiratory illness  Other infective otitis externa of both ears, unspecified chronicity    ED Discharge Orders         Ordered    HYDROcodone-homatropine (HYCODAN) 5-1.5 MG/5ML syrup  Every 6 hours PRN     05/14/18 0049    dexamethasone (DECADRON) 4 MG tablet  2 times daily with meals     05/14/18 0049    loratadine-pseudoephedrine (CLARITIN-D 12 HOUR) 5-120 MG tablet  2 times daily     05/14/18 0049           Lily Kocher, PA-C 05/14/18 0123    Rolland Porter, MD 05/14/18 [phone]

## 2018-05-14 MED ORDER — DEXAMETHASONE 4 MG PO TABS: 4.0000 mg | ORAL_TABLET | Freq: Two times a day (BID) | ORAL | 0 refills | Status: DC

## 2018-05-14 MED ORDER — NEOMYCIN-POLYMYXIN-HC 1 % OT SOLN
4.0000 [drp] | Freq: Once | OTIC | Status: AC
  Administered 2018-05-14: 4 [drp] via OTIC
  Filled 2018-05-14: qty 10

## 2018-05-14 MED ORDER — PSEUDOEPHEDRINE HCL 60 MG PO TABS
60.0000 mg | ORAL_TABLET | Freq: Once | ORAL | Status: AC
  Administered 2018-05-14: 60 mg via ORAL
  Filled 2018-05-14: qty 1

## 2018-05-14 MED ORDER — LORATADINE-PSEUDOEPHEDRINE ER 5-120 MG PO TB12: 1.0000 | ORAL_TABLET | Freq: Two times a day (BID) | ORAL | 0 refills | Status: DC

## 2018-05-14 MED ORDER — HYDROCODONE-HOMATROPINE 5-1.5 MG/5ML PO SYRP: 5.0000 mL | ORAL_SOLUTION | Freq: Four times a day (QID) | ORAL | 0 refills | Status: DC | PRN

## 2018-05-14 MED ORDER — HYDROCOD POLST-CPM POLST ER 10-8 MG/5ML PO SUER
5.0000 mL | Freq: Once | ORAL | Status: AC
  Administered 2018-05-14: 5 mL via ORAL
  Filled 2018-05-14: qty 5

## 2018-05-14 MED ORDER — PREDNISONE 20 MG PO TABS
40.0000 mg | ORAL_TABLET | Freq: Once | ORAL | Status: AC
  Administered 2018-05-14: 40 mg via ORAL
  Filled 2018-05-14: qty 2

## 2018-05-14 NOTE — Discharge Instructions (Addendum)
You have multiple scratches in both the ears.  There is increased redness also present in both external auditory canals and of the left eardrum.  Please refrain from all use of Q-tips in your ears.  Your examination suggest an upper respiratory infection with sinusitis and fever.  Please use Tylenol every 4 hours, or ibuprofen every 6 hours for fever, and/or aching.  Use 4 drops of Cortisporin 3 times daily for the next 5 to 7 days.  Decadron and Claritin-D 2 times daily with food.  Use Hycodan for cough. This medication may cause drowsiness. Please do not drink, drive, or participate in activity that requires concentration while taking this medication.  Please increase water and fluids.  Wash hands frequently.  Usual mask until symptoms have resolved.

## 2018-06-13 ENCOUNTER — Other Ambulatory Visit (HOSPITAL_COMMUNITY): Payer: Self-pay | Admitting: *Deleted

## 2018-06-13 DIAGNOSIS — Z1231 Encounter for screening mammogram for malignant neoplasm of breast: Secondary | ICD-10-CM

## 2018-07-07 ENCOUNTER — Ambulatory Visit (HOSPITAL_COMMUNITY): Payer: Self-pay

## 2018-07-28 ENCOUNTER — Ambulatory Visit
Admission: RE | Admit: 2018-07-28 | Discharge: 2018-07-28 | Disposition: A | Discharge status: Home / Self Care | Payer: No Typology Code available for payment source | Source: Ambulatory Visit | Attending: Obstetrics and Gynecology | Admitting: Obstetrics and Gynecology

## 2018-07-28 ENCOUNTER — Ambulatory Visit (HOSPITAL_COMMUNITY)
Admission: RE | Admit: 2018-07-28 | Discharge: 2018-07-28 | Disposition: A | Discharge status: Home / Self Care | Payer: Self-pay | Source: Ambulatory Visit | Attending: Obstetrics and Gynecology | Admitting: Obstetrics and Gynecology

## 2018-07-28 ENCOUNTER — Encounter (HOSPITAL_COMMUNITY): Payer: Self-pay

## 2018-07-28 VITALS — BP 128/72 | Wt 241.0 lb

## 2018-07-28 DIAGNOSIS — Z1231 Encounter for screening mammogram for malignant neoplasm of breast: Secondary | ICD-10-CM

## 2018-07-28 DIAGNOSIS — Z01419 Encounter for gynecological examination (general) (routine) without abnormal findings: Secondary | ICD-10-CM

## 2018-07-28 HISTORY — DX: Anemia, unspecified: D64.9

## 2018-07-28 NOTE — Patient Instructions (Signed)
Explained breast self awareness with [name]. Let patient know that if today's Pap smear is normal that her next Pap smear will be due in one year due to her history of an abnormal Pap smear. Referred patient to the [facility] for a screening mammogram. Appointment scheduled for Thursday, July 28, 2018 at 1430. Patient aware of appointment and will be there. Let patient know will follow up with her within the next couple weeks with results of Pap smear by letter or phone. Informed patient that the [facility] will follow-up with her within the next couple of weeks with results of mammogram by letter or phone. Patient referred to the [facility] Quit line and given information about smoking cessation classes offered at the [facility]. Wendy Singh verbalized understanding.  Serai Tukes, Arvil Chaco, RN 12:56 PM

## 2018-07-28 NOTE — Progress Notes (Signed)
Complaints of a right breast lump since 2015 that comes and goes. Patient stated she had the area biopsied around 2015 and it was benign. Patient stated it flares up during menstrual period. Patient complained of right breast pain at area that lump occurs x the past two days. Patient states the pain comes and goes. Patient  rates the pain at a 6 out of 10. Patient states she is due for her period soon.  Pap Smear: Pap smear completed today. Last Pap smear was  06/17/2017 at [facility] and normal with negative HPV. 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 Patient had a colposcopy completed 01/15/2014 that showed CIN-III, LEEP 03/26/2014 that showed CIN III, and a CKC 09/26/2014 for follow-up. Per patient the Pap smear on 06/13/2013 is the only abnormal Pap smear she has had. Last two Pap smear, colposcopy, LEEP, and CKC results are in Epic.  Physical exam: Breasts Right breast slightly larger than left breast. No skin abnormalities bilateral breasts. No nipple retraction bilateral breasts. No nipple discharge bilateral breasts. No lymphadenopathy. No lumps palpated bilateral breasts. Unable to palpate a lump in patients area of concern. No complaints of pain or tenderness on exam. Referred patient to the [place] for a screening mammogram. Appointment scheduled for Thursday, July 28, 2018 at 1430.        Pelvic/Bimanual   Ext Genitalia No lesions, no swelling and no discharge observed on external genitalia.         Vagina Vagina pink and normal texture. No lesions or discharge observed in vagina.          Cervix Cervix is present. Cervix pink and of normal texture. No discharge observed.     Uterus Uterus is present and palpable. Uterus in normal position and normal size.        Adnexae Bilateral ovaries present and palpable. No tenderness on palpation.         Rectovaginal No  rectal exam completed today since patient had no rectal complaints. No skin abnormalities observed on exam.    Smoking History: Smoking cessation discussed with patient. Patient referred to the [facility] Quit line and given information about smoking cessation classes offered at the [facility].  Patient Navigation: Patient education provided. Access to services provided for patient through [facility] program.   Breast and Cervical Cancer Risk Assessment: Patient has a family history of her maternal grandmother having breast cancer. patient has no known genetic mutations or history of radiation treatment to the chest before age 39. Patient has a history of cervical dysplasia. Patient has no history of being immunocompromised or DES exposure in-utero.  Risk Assessment    Risk Scores      07/28/2018   Last edited by: Armond Hang, LPN   5-year risk: 0.6 %   Lifetime risk: 8.2 %

## 2018-07-29 ENCOUNTER — Encounter (HOSPITAL_COMMUNITY): Payer: Self-pay | Admitting: *Deleted

## 2018-08-02 LAB — CYTOLOGY - PAP
Adequacy: ABSENT
Diagnosis: NEGATIVE
HPV: NOT DETECTED

## 2018-08-05 ENCOUNTER — Telehealth (HOSPITAL_COMMUNITY): Payer: Self-pay | Admitting: *Deleted

## 2018-08-05 NOTE — Telephone Encounter (Signed)
Patient called this morning stating she got a mychart message about her Pap smear result but was unable to see her result. Explained to patient that her Pap smear was normal and the HPV was negative. Let her know that her next Pap smear is due in one year due to her history of abnormal Pap smears. Patient verbalized understanding.

## 2018-08-15 IMAGING — US US PELVIS COMPLETE
1 series · 13 of 25 positions shown · non-contrast
Comparison: CT of the abdomen pelvis dated 12/11/2017

CLINICAL DATA: 41-year-old female with right flank/pelvic pain.

EXAM:
TRANSABDOMINAL AND TRANSVAGINAL ULTRASOUND OF PELVIS
DOPPLER ULTRASOUND OF OVARIES
TECHNIQUE: Both transabdominal and transvaginal ultrasound examinations of the
pelvis were performed. Transabdominal technique was performed for
global imaging of the pelvis including uterus, ovaries, adnexal
regions, and pelvic cul-de-sac.
It was necessary to proceed with endovaginal exam following the
transabdominal exam to visualize the endometrium and ovaries. Color
and duplex Doppler ultrasound was utilized to evaluate blood flow to
the ovaries.

[Series 1: us pelvis complete · 0.30mm/px · 13 of 137 slices shown]
[im 1/137]
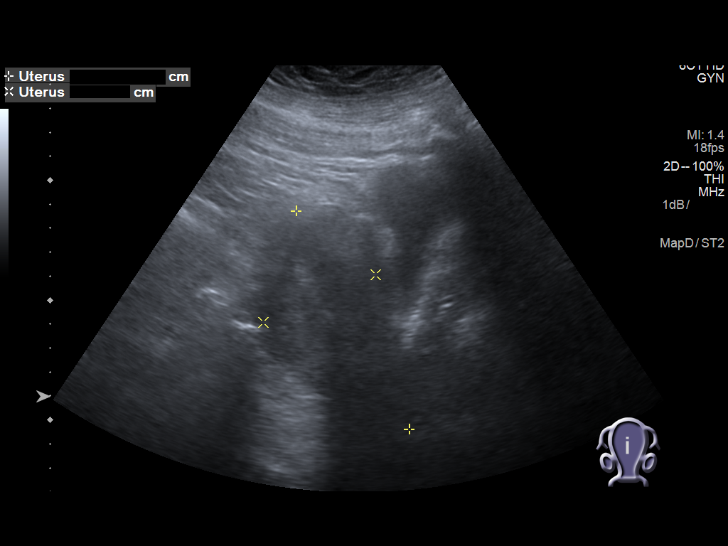
[im 12/137]
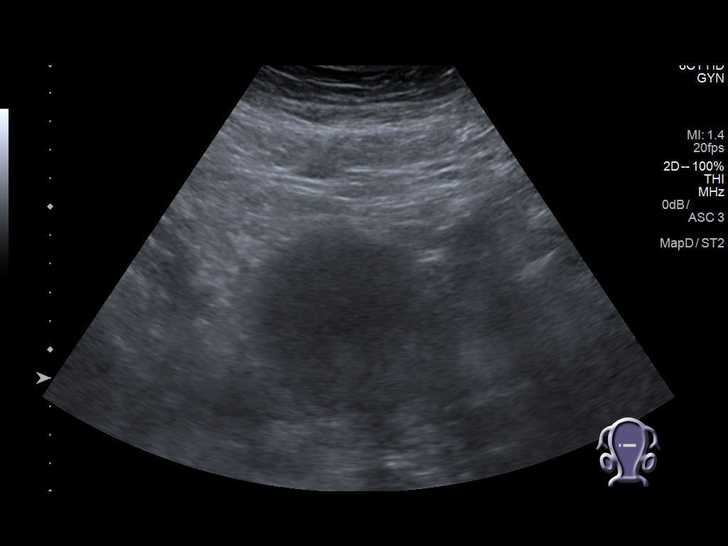
[im 23/137]
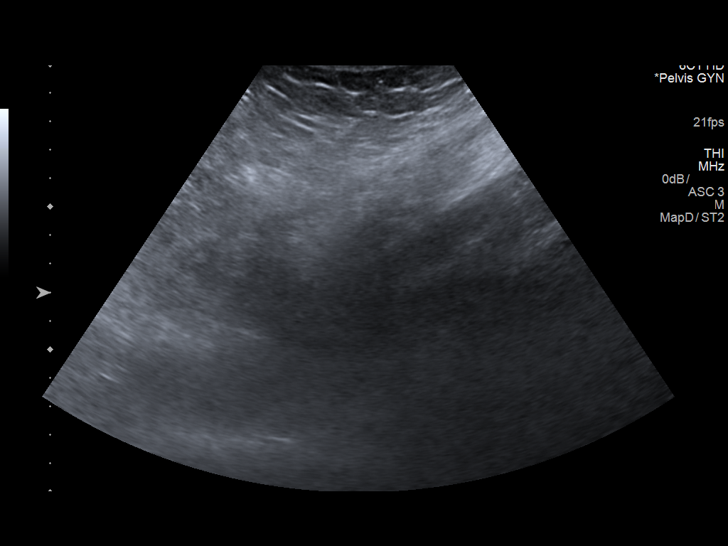
[im 35/137]
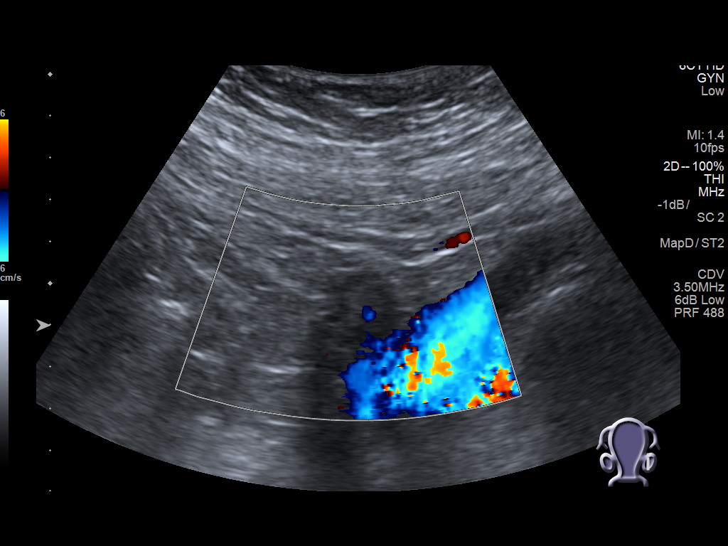
[im 46/137]
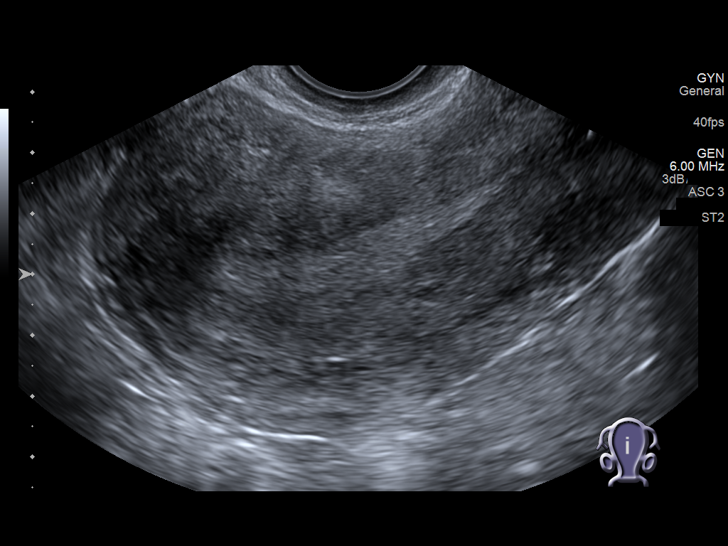
[im 57/137]
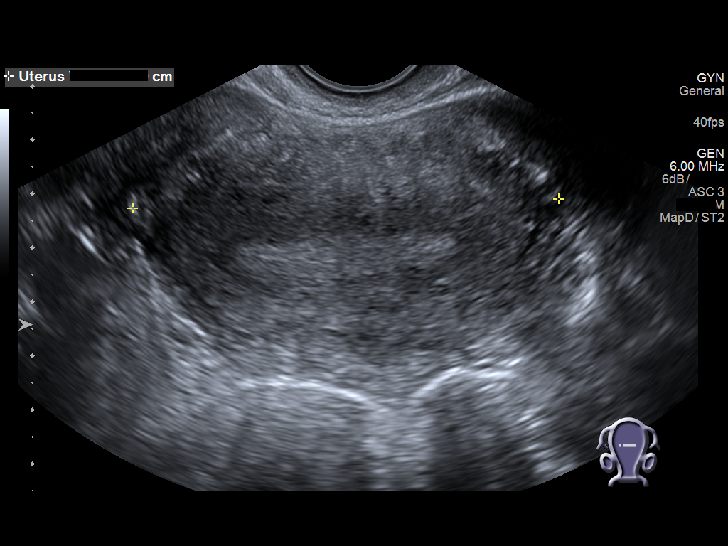
[im 69/137]
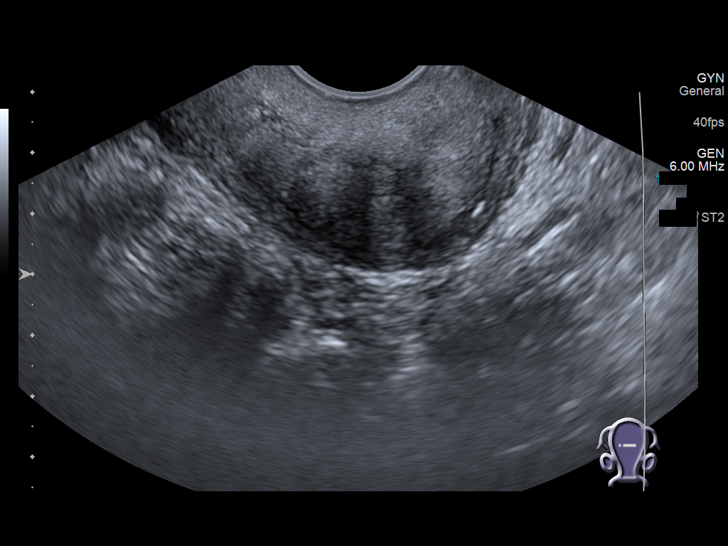
[im 80/137]
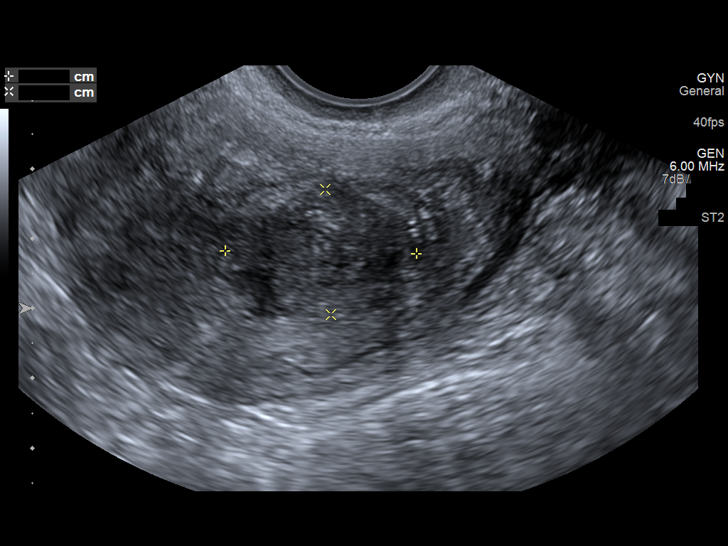
[im 91/137]
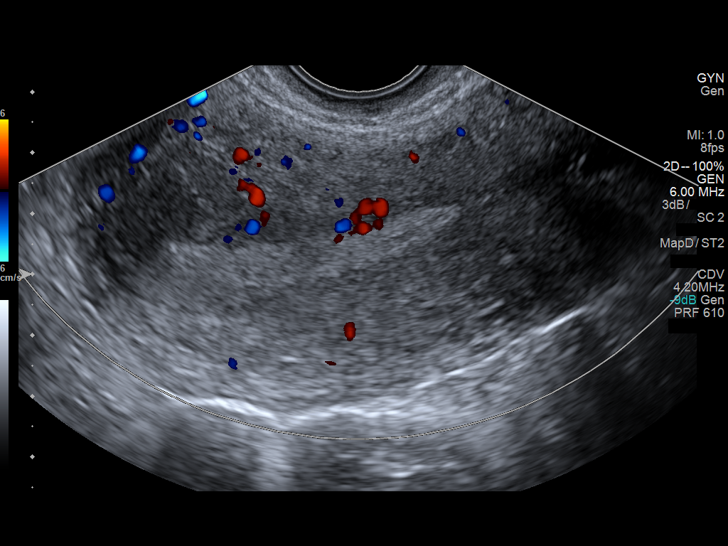
[im 103/137]
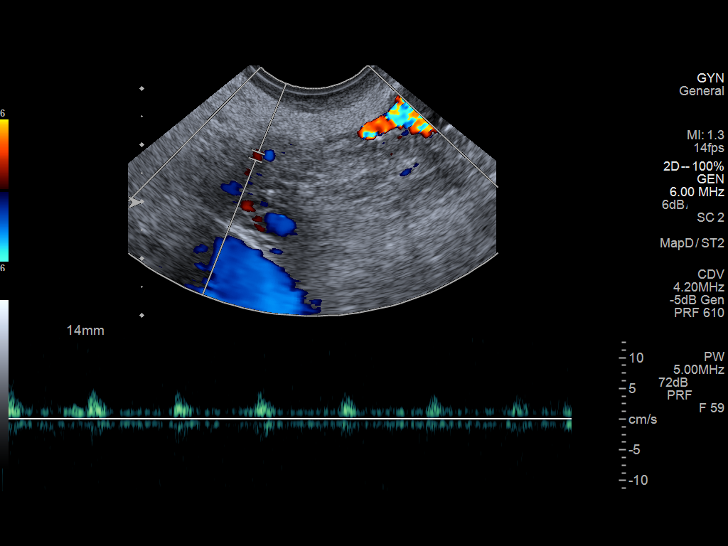
[im 114/137]
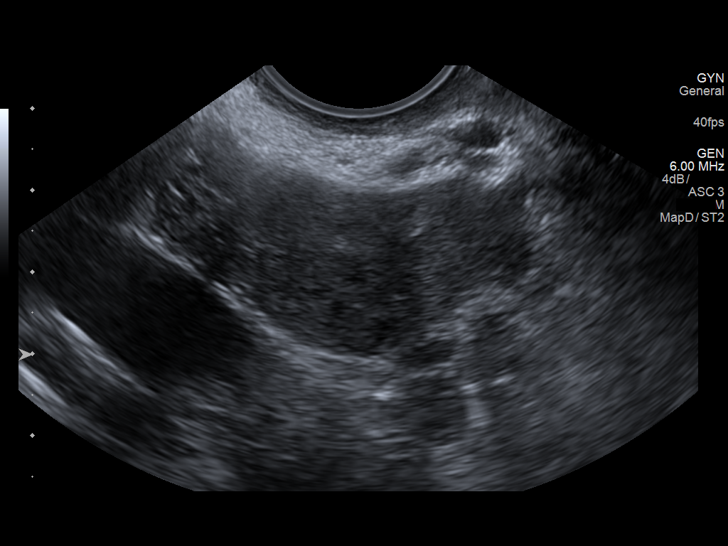
[im 125/137]
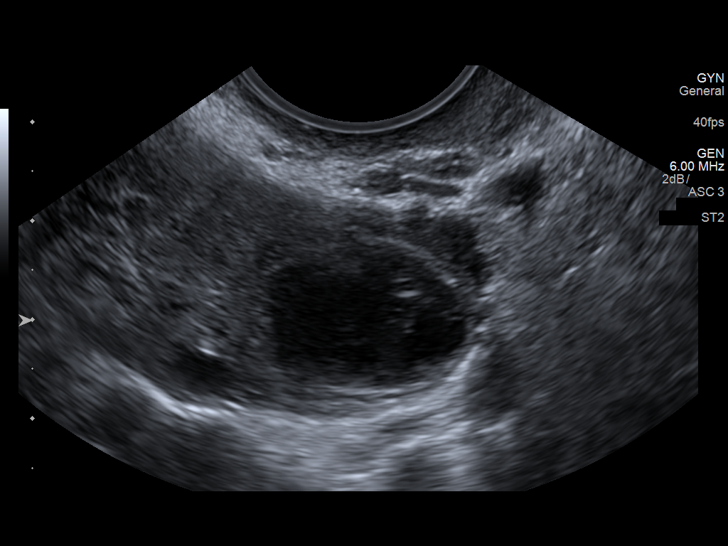
[im 137/137]
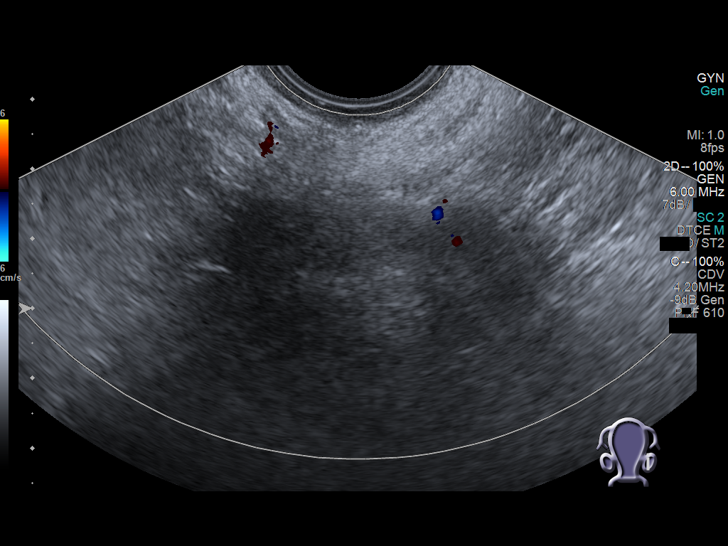

[13 of 25 positions shown; findings below may reference images not displayed]

FINDINGS: Uterus

Measurements: 9.9 x 5.8 x 7.9 cm.. The uterus is anteverted. There
is a 2.7 x 1.8 x 3.0 cm heterogeneous predominantly hypoechoic mass
with internal vascularity in the right uterine body most consistent
with fibroid. This likely corresponds to the 9 mm fibroid seen on
the ultrasound of 10/27/2010.

Endometrium

Thickness: 8 mm.  No focal abnormality visualized.

Right ovary

Measurements: Fibroid 3 x 2.4 x 3.2 cm. There is a complex cystic
structure measuring 2.2 x 1.8 x 2.1 cm with no internal vascularity
most consistent with hemorrhagic cyst or corpus luteum.

Left ovary

Measurements: 2.4 x 1.9 x 1.8 cm. Normal appearance/no adnexal mass.

Pulsed Doppler evaluation of both ovaries demonstrates normal
low-resistance arterial and venous waveforms.

Other findings

Small free fluid in the pelvis.
IMPRESSION: 1. Right ovarian hemorrhagic cyst/corpus luteum.
2. Doppler detected arterial and venous flow to both ovaries.
3. Right uterine body intramural fibroid with increase in size since
the ultrasound of 6376.

## 2018-08-16 ENCOUNTER — Other Ambulatory Visit: Payer: Self-pay

## 2018-08-16 ENCOUNTER — Emergency Department (HOSPITAL_COMMUNITY): Payer: Self-pay

## 2018-08-16 ENCOUNTER — Encounter (HOSPITAL_COMMUNITY): Payer: Self-pay | Admitting: *Deleted

## 2018-08-16 ENCOUNTER — Emergency Department (HOSPITAL_COMMUNITY)
Admission: EM | Admit: 2018-08-16 | Discharge: 2018-08-16 | Disposition: A | Discharge status: Home / Self Care | Payer: Self-pay | Attending: Emergency Medicine | Admitting: Emergency Medicine

## 2018-08-16 DIAGNOSIS — F17210 Nicotine dependence, cigarettes, uncomplicated: Secondary | ICD-10-CM | POA: Insufficient documentation

## 2018-08-16 DIAGNOSIS — J111 Influenza due to unidentified influenza virus with other respiratory manifestations: Secondary | ICD-10-CM | POA: Insufficient documentation

## 2018-08-16 DIAGNOSIS — J209 Acute bronchitis, unspecified: Secondary | ICD-10-CM | POA: Insufficient documentation

## 2018-08-16 MED ORDER — OSELTAMIVIR PHOSPHATE 75 MG PO CAPS: 75.0000 mg | ORAL_CAPSULE | Freq: Two times a day (BID) | ORAL | 0 refills | Status: DC

## 2018-08-16 MED ORDER — HYDROCOD POLST-CPM POLST ER 10-8 MG/5ML PO SUER
5.0000 mL | Freq: Once | ORAL | Status: AC
  Administered 2018-08-16: 5 mL via ORAL
  Filled 2018-08-16: qty 5

## 2018-08-16 MED ORDER — HYDROCODONE-HOMATROPINE 5-1.5 MG/5ML PO SYRP: 5.0000 mL | ORAL_SOLUTION | Freq: Four times a day (QID) | ORAL | 0 refills | Status: DC | PRN

## 2018-08-16 MED ORDER — ALBUTEROL SULFATE HFA 108 (90 BASE) MCG/ACT IN AERS
2.0000 | INHALATION_SPRAY | Freq: Once | RESPIRATORY_TRACT | Status: AC
  Administered 2018-08-16: 2 via RESPIRATORY_TRACT
  Filled 2018-08-16: qty 6.7

## 2018-08-16 MED ORDER — DIPHENHYDRAMINE HCL 12.5 MG/5ML PO ELIX
12.5000 mg | ORAL_SOLUTION | Freq: Once | ORAL | Status: AC
  Administered 2018-08-16: 12.5 mg via ORAL
  Filled 2018-08-16: qty 5

## 2018-08-16 MED ORDER — PREDNISONE 20 MG PO TABS
40.0000 mg | ORAL_TABLET | Freq: Once | ORAL | Status: AC
  Administered 2018-08-16: 40 mg via ORAL
  Filled 2018-08-16: qty 2

## 2018-08-16 MED ORDER — DOXYCYCLINE HYCLATE 100 MG PO TABS
100.0000 mg | ORAL_TABLET | Freq: Once | ORAL | Status: AC
  Administered 2018-08-16: 100 mg via ORAL
  Filled 2018-08-16: qty 1

## 2018-08-16 MED ORDER — IBUPROFEN 800 MG PO TABS
800.0000 mg | ORAL_TABLET | Freq: Once | ORAL | Status: AC
  Administered 2018-08-16: 800 mg via ORAL
  Filled 2018-08-16: qty 1

## 2018-08-16 MED ORDER — DOXYCYCLINE HYCLATE 100 MG PO CAPS: 100.0000 mg | ORAL_CAPSULE | Freq: Two times a day (BID) | ORAL | 0 refills | Status: DC

## 2018-08-16 MED ORDER — ONDANSETRON HCL 4 MG PO TABS
4.0000 mg | ORAL_TABLET | Freq: Once | ORAL | Status: AC
  Administered 2018-08-16: 4 mg via ORAL
  Filled 2018-08-16: qty 1

## 2018-08-16 MED ORDER — OSELTAMIVIR PHOSPHATE 75 MG PO CAPS
75.0000 mg | ORAL_CAPSULE | Freq: Once | ORAL | Status: AC
  Administered 2018-08-16: 75 mg via ORAL
  Filled 2018-08-16: qty 1

## 2018-08-16 NOTE — ED Provider Notes (Signed)
[facility] Provider Note   CSN: [id number] Arrival date & time: 08/16/18  1449    History   Chief Complaint Chief Complaint  Patient presents with  . Generalized Body Aches    HPI Wendy Singh is a 43 y.o. female.     The history is provided by the patient.  URI  Presenting symptoms: congestion, cough, fatigue and fever   Severity:  Moderate Onset quality:  Gradual Duration:  2 days Timing:  Intermittent Progression:  Worsening Chronicity:  New Relieved by:  Nothing Worsened by:  Nothing Ineffective treatments:  OTC medications Associated symptoms: headaches and myalgias   Associated symptoms: no arthralgias, no neck pain and no wheezing   Risk factors: sick contacts   Risk factors: no recent travel     Past Medical History:  Diagnosis Date  . Anemia   . Depression   . Kidney stones   . Migraines   . Ovarian cyst   . Vaginal Pap smear, abnormal     Patient Active Problem List   Diagnosis Date Noted  . 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 HSIL (high grade squamous intraepithelial lesion) on Pap smear of cervix 01/09/2014    Past Surgical History:  Procedure Laterality Date  . BREAST BIOPSY  05/2013  . CERVICAL BIOPSY  W/ LOOP ELECTRODE EXCISION    . CERVICAL CONIZATION W/BX N/A 09/26/2014   Procedure: CONIZATION CERVIX WITH BIOPSY;  Surgeon: Emily Filbert, MD;  Location: [facility];  Service: Gynecology;  Laterality: N/A;  . LAPAROSCOPIC BILATERAL SALPINGECTOMY Bilateral 09/26/2014   Procedure: LAPAROSCOPIC BILATERAL SALPINGECTOMY;  Surgeon: Emily Filbert, MD;  Location: [facility];  Service: Gynecology;  Laterality: Bilateral;     OB History    Gravida  3   Para  2   Term  2   Preterm      AB  1   Living  2     SAB  1   TAB      Ectopic      Multiple      Live Births  2            Home Medications    Prior to Admission medications   Medication Sig Start Date End Date Taking? Authorizing Provider    cephALEXin (KEFLEX) 500 MG capsule Take 1 capsule (500 mg total) by mouth 4 (four) times daily. Patient not taking: Reported on 07/28/2018 03/12/18   Kinnie Feil, PA-C  dexamethasone (DECADRON) 4 MG tablet Take 1 tablet (4 mg total) by mouth 2 (two) times daily with a meal. Patient not taking: Reported on 07/28/2018 05/14/18   Lily Kocher, PA-C  HYDROcodone-homatropine [facility]) 5-1.5 MG/5ML syrup Take 5 mLs by mouth every 6 (six) hours as needed. Use for cough and congestion. Patient not taking: Reported on 07/28/2018 05/14/18   Lily Kocher, PA-C  loratadine-pseudoephedrine (CLARITIN-D 12 HOUR) 5-120 MG tablet Take 1 tablet by mouth 2 (two) times daily. Patient not taking: Reported on 07/28/2018 05/14/18   Lily Kocher, PA-C  metroNIDAZOLE (FLAGYL) 500 MG tablet Take 1 tablet (500 mg total) by mouth 2 (two) times daily. Patient not taking: Reported on 03/12/2018 12/11/17   Francine Graven, DO  ondansetron (ZOFRAN) 4 MG tablet Take 1 tablet (4 mg total) by mouth every 8 (eight) hours as needed for nausea or vomiting. Patient not taking: Reported on 07/28/2018 03/12/18   Kinnie Feil, PA-C  oxyCODONE-acetaminophen (PERCOCET/ROXICET) 5-325 MG tablet Take 1 tablet by mouth every  4 (four) hours as needed. Patient not taking: Reported on 07/28/2018 03/15/18   Evalee Jefferson, PA-C  promethazine (PHENERGAN) 25 MG suppository Place 1 suppository (25 mg total) rectally every 6 (six) hours as needed for nausea or vomiting. Patient not taking: Reported on 07/28/2018 03/15/18   Evalee Jefferson, PA-C    Family History Family History  Problem Relation Age of Onset  . Hypertension Father   . Diabetes Brother     Social History Social History   Tobacco Use  . Smoking status: Current Every Day Smoker    Packs/day: 0.25    Years: 10.00    Pack years: 2.50    Types: Cigarettes  . Smokeless tobacco: Never Used  Substance Use Topics  . Alcohol use: No  . Drug use: No     Allergies    Penicillins   Review of Systems Review of Systems  Constitutional: Positive for fatigue and fever. Negative for activity change.       All ROS Neg except as noted in HPI  HENT: Positive for congestion and postnasal drip. Negative for nosebleeds.   Eyes: Negative for photophobia and discharge.  Respiratory: Positive for cough. Negative for shortness of breath and wheezing.   Cardiovascular: Negative for chest pain and palpitations.  Gastrointestinal: Negative for abdominal pain and blood in stool.  Genitourinary: Negative for dysuria, frequency and hematuria.  Musculoskeletal: Positive for myalgias. Negative for arthralgias, back pain and neck pain.  Skin: Negative.   Neurological: Positive for headaches. Negative for dizziness, seizures and speech difficulty.  Psychiatric/Behavioral: Negative for confusion and hallucinations.     Physical Exam Updated Vital Signs BP 138/81 (BP Location: Right Arm)   Pulse 75   Temp 98.1 F (36.7 C) (Oral)   Resp 16   Ht 6' (1.829 m)   Wt 109 kg   LMP 08/04/2018   SpO2 100%   BMI 32.59 kg/m   Physical Exam Vitals signs and nursing note reviewed.  Constitutional:      Appearance: She is well-developed. She is not toxic-appearing.  HENT:     Head: Normocephalic.     Right Ear: Tympanic membrane and external ear normal.     Left Ear: Tympanic membrane and external ear normal.     Nose: Congestion present.  Eyes:     General: Lids are normal.     Pupils: Pupils are equal, round, and reactive to light.  Neck:     Musculoskeletal: Normal range of motion and neck supple.     Vascular: No carotid bruit.  Cardiovascular:     Rate and Rhythm: Normal rate and regular rhythm.     Pulses: Normal pulses.     Heart sounds: Normal heart sounds.  Pulmonary:     Effort: No respiratory distress.     Breath sounds: Normal breath sounds.  Abdominal:     General: Bowel sounds are normal.     Palpations: Abdomen is soft.     Tenderness: There is  no abdominal tenderness. There is no guarding.  Musculoskeletal: Normal range of motion.  Lymphadenopathy:     Head:     Right side of head: No submandibular adenopathy.     Left side of head: No submandibular adenopathy.     Cervical: No cervical adenopathy.  Skin:    General: Skin is warm and dry.  Neurological:     Mental Status: She is alert and oriented to person, place, and time.     Cranial Nerves: No cranial nerve deficit.  Sensory: No sensory deficit.  Psychiatric:        Speech: Speech normal.      ED Treatments / Results  Labs (all labs ordered are listed, but only abnormal results are displayed) Labs Reviewed - No data to display  EKG None  Radiology No results found.  Procedures Procedures (including critical care time)  Medications Ordered in ED Medications - No data to display   Initial Impression / Assessment and Plan / ED Course  I have reviewed the triage vital signs and the nursing notes.  Pertinent labs & imaging results that were available during my care of the patient were reviewed by me and considered in my medical decision making (see chart for details).          Final Clinical Impressions(s) / ED Diagnoses MDM  Vital signs reviewed.  The examination favors influenza.  The patient has a family member who has been diagnosed with flu.  The chest x-ray shows bronchitis.  The patient is a smoker.  The patient will be treated with antibiotics concerning the bronchitis.  The patient will be treated with Tamiflu, and asked to use cough medication, and Tylenol, and/or ibuprofen for fever and aching.  The patient is to follow-up with the primary physician or return to the emergency department if any changes in condition, problems, or concerns.   Final diagnoses:  Influenza  Acute bronchitis, unspecified organism    ED Discharge Orders         Ordered    oseltamivir (TAMIFLU) 75 MG capsule  Every 12 hours     08/16/18 1836    doxycycline  (VIBRAMYCIN) 100 MG capsule  2 times daily     08/16/18 1836    HYDROcodone-homatropine (HYCODAN) 5-1.5 MG/5ML syrup  Every 6 hours PRN     08/16/18 1836           Lily Kocher, PA-C 08/16/18 1843    Julianne Rice, MD 08/16/18 2344

## 2018-08-16 NOTE — ED Triage Notes (Signed)
Pt c/o generalized body aches with fever n/v/d that started yesterday; pt has been around a family member with the flu

## 2018-08-16 NOTE — ED Notes (Signed)
Patient transported to X-ray 

## 2018-08-16 NOTE — Discharge Instructions (Signed)
Your examination is consistent with influenza or influenza-like organism.  Your chest x-ray shows a bronchitis.  Please stop smoking.  Please use Tamiflu 2 times daily.  Use Hycodan every 6 hours as needed for cough. This medication may cause drowsiness. Please do not drink, drive, or participate in activity that requires concentration while taking this medication.  Use doxycycline 2 times daily.  Use Tylenol every 4 hours or ibuprofen every 6 hours for fever, and/or aching.  Please use your mask until symptoms have resolved.  Claritin-D may be helpful for congestion in the nasal and sinus passages.  Please wash hands frequently, and increase of fluids.  Good hydration is extremely important.  Please see your primary physician or return to the emergency department if any changes in your condition, problems, or concerns.

## 2018-09-07 ENCOUNTER — Other Ambulatory Visit: Payer: Self-pay

## 2018-09-07 ENCOUNTER — Encounter (HOSPITAL_COMMUNITY): Payer: Self-pay

## 2018-09-07 ENCOUNTER — Emergency Department (HOSPITAL_COMMUNITY): Payer: Self-pay

## 2018-09-07 ENCOUNTER — Emergency Department (HOSPITAL_COMMUNITY)
Admission: EM | Admit: 2018-09-07 | Discharge: 2018-09-07 | Disposition: A | Discharge status: Home / Self Care | Payer: Self-pay | Attending: Emergency Medicine | Admitting: Emergency Medicine

## 2018-09-07 DIAGNOSIS — R112 Nausea with vomiting, unspecified: Secondary | ICD-10-CM | POA: Insufficient documentation

## 2018-09-07 DIAGNOSIS — Z20828 Contact with and (suspected) exposure to other viral communicable diseases: Secondary | ICD-10-CM | POA: Insufficient documentation

## 2018-09-07 DIAGNOSIS — R197 Diarrhea, unspecified: Secondary | ICD-10-CM | POA: Insufficient documentation

## 2018-09-07 DIAGNOSIS — R05 Cough: Secondary | ICD-10-CM | POA: Insufficient documentation

## 2018-09-07 DIAGNOSIS — F17210 Nicotine dependence, cigarettes, uncomplicated: Secondary | ICD-10-CM | POA: Insufficient documentation

## 2018-09-07 DIAGNOSIS — R059 Cough, unspecified: Secondary | ICD-10-CM

## 2018-09-07 LAB — CBC WITH DIFFERENTIAL/PLATELET
Abs Immature Granulocytes: 0.01 10*3/uL (ref 0.00–0.07)
Abs Immature Granulocytes: 0.05 10*3/uL (ref 0.00–0.07)
Basophils Absolute: 0 10*3/uL (ref 0.0–0.1)
Basophils Relative: 0 %
Eosinophils Absolute: 0.2 10*3/uL (ref 0.0–0.5)
Eosinophils Relative: 2 %
HCT: 43.1 % (ref 36.0–46.0)
Hemoglobin: 14.5 g/dL (ref 12.0–15.0)
Immature Granulocytes: 0 %
Immature Granulocytes: 1 %
Lymphocytes Relative: 24 %
Lymphs Abs: 2.5 10*3/uL (ref 0.7–4.0)
MCH: 31.1 pg (ref 26.0–34.0)
MCHC: 33.6 g/dL (ref 30.0–36.0)
MCV: 92.5 fL (ref 80.0–100.0)
Monocytes Absolute: 0.7 10*3/uL (ref 0.1–1.0)
Monocytes Relative: 7 %
Neutro Abs: 6.8 10*3/uL (ref 1.7–7.7)
Neutrophils Relative %: 66 %
Platelets: 253 10*3/uL (ref 150–400)
RBC: 4.66 MIL/uL (ref 3.87–5.11)
RDW: 12.7 % (ref 11.5–15.5)
WBC: 10.2 10*3/uL (ref 4.0–10.5)
nRBC: 0 % (ref 0.0–0.2)

## 2018-09-07 LAB — COMPREHENSIVE METABOLIC PANEL
ALT: 25 U/L (ref 0–44)
AST: 19 U/L (ref 15–41)
Albumin: 4.1 g/dL (ref 3.5–5.0)
Alkaline Phosphatase: 77 U/L (ref 38–126)
Anion gap: 6 (ref 5–15)
BUN: 12 mg/dL (ref 6–20)
CO2: 23 mmol/L (ref 22–32)
Calcium: 10.1 mg/dL (ref 8.9–10.3)
Chloride: 110 mmol/L (ref 98–111)
Creatinine, Ser: 0.77 mg/dL (ref 0.44–1.00)
GFR calc Af Amer: >60 mL/min (ref >60)
GFR calc non Af Amer: >60 mL/min (ref >60)
Glucose, Bld: 101 mg/dL — ABNORMAL HIGH (ref 70–99)
Potassium: 4 mmol/L (ref 3.5–5.1)
Sodium: 139 mmol/L (ref 135–145)
Total Bilirubin: 0.5 mg/dL (ref 0.3–1.2)
Total Protein: 7.6 g/dL (ref 6.5–8.1)

## 2018-09-07 LAB — INFLUENZA PANEL BY PCR (TYPE A & B)
Influenza A By PCR: NEGATIVE
Influenza B By PCR: NEGATIVE

## 2018-09-07 LAB — URINALYSIS, ROUTINE W REFLEX MICROSCOPIC
Bilirubin Urine: NEGATIVE
Glucose, UA: NEGATIVE mg/dL
Ketones, ur: NEGATIVE mg/dL
Leukocytes,Ua: NEGATIVE
Nitrite: NEGATIVE
Protein, ur: NEGATIVE mg/dL
Specific Gravity, Urine: 1.018 (ref 1.005–1.030)
pH: 6 (ref 5.0–8.0)

## 2018-09-07 LAB — PREGNANCY, URINE: Preg Test, Ur: NEGATIVE

## 2018-09-07 MED ORDER — PROMETHAZINE HCL 25 MG/ML IJ SOLN
12.5000 mg | Freq: Once | INTRAMUSCULAR | Status: AC
  Administered 2018-09-07: 12.5 mg via INTRAVENOUS
  Filled 2018-09-07: qty 1

## 2018-09-07 MED ORDER — ALBUTEROL SULFATE HFA 108 (90 BASE) MCG/ACT IN AERS
2.0000 | INHALATION_SPRAY | Freq: Once | RESPIRATORY_TRACT | Status: AC
  Administered 2018-09-07: 2 via RESPIRATORY_TRACT
  Filled 2018-09-07: qty 6.7

## 2018-09-07 MED ORDER — ONDANSETRON HCL 4 MG/2ML IJ SOLN
4.0000 mg | Freq: Once | INTRAMUSCULAR | Status: AC
  Administered 2018-09-07: 4 mg via INTRAVENOUS
  Filled 2018-09-07: qty 2

## 2018-09-07 MED ORDER — PROMETHAZINE HCL 25 MG PO TABS: 25.0000 mg | ORAL_TABLET | Freq: Four times a day (QID) | ORAL | 0 refills | Status: DC | PRN

## 2018-09-07 MED ORDER — LOPERAMIDE HCL 2 MG PO CAPS: 2.0000 mg | ORAL_CAPSULE | Freq: Four times a day (QID) | ORAL | 0 refills | Status: DC | PRN

## 2018-09-07 MED ORDER — BENZONATATE 100 MG PO CAPS: 200.0000 mg | ORAL_CAPSULE | Freq: Three times a day (TID) | ORAL | 0 refills | Status: DC | PRN

## 2018-09-07 MED ORDER — BENZONATATE 100 MG PO CAPS
200.0000 mg | ORAL_CAPSULE | Freq: Once | ORAL | Status: AC
  Administered 2018-09-07: 200 mg via ORAL
  Filled 2018-09-07: qty 2

## 2018-09-07 MED ORDER — ONDANSETRON 4 MG PO TBDP: 4.0000 mg | ORAL_TABLET | Freq: Three times a day (TID) | ORAL | 0 refills | Status: DC | PRN

## 2018-09-07 NOTE — ED Notes (Signed)
RT notified for inhaler

## 2018-09-07 NOTE — ED Notes (Signed)
Discharge instructions reviewed with patient, discussed quarantine with pt, pt verbalized understanding.

## 2018-09-07 NOTE — ED Provider Notes (Addendum)
[facility] EMERGENCY DEPARTMENT Provider Note   CSN: [id number] Arrival date & time: 09/07/18  0710    History   Chief Complaint Chief Complaint  Patient presents with  . Fever  . Cough    HPI Artha Stavros is a 43 y.o. female with a history of anemia, depression, kidney stones and was recently treated for both influenza and bronchitis after a visit here on March 3.  She completed a 10-day course of doxycycline on March 13.  She then traveled to [place] to be with her brother who had an extended illness and died.  She denies any known contacts with Covid positive persons. She presents with multiple symptoms including a nonproductive cough which she suspects is allergy related, as she also endorses itchy eyes and clear rhinorrhea.  She woke last night also with vomiting and diarrhea, reporting multiple episodes most recently an hour before arriving here.  She denies hematemesis or bloody stools.  She does have lower abdominal cramping which improves after bowel movements.  She endorses a fever to 101.  She has had no treatments prior to arrival.     The history is provided by the patient.    Past Medical History:  Diagnosis Date  . Anemia   . Depression   . Kidney stones   . Migraines   . Ovarian cyst   . Vaginal Pap smear, abnormal     Patient Active Problem List   Diagnosis Date Noted  . 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 HSIL (high grade squamous intraepithelial lesion) on Pap smear of cervix 01/09/2014    Past Surgical History:  Procedure Laterality Date  . BREAST BIOPSY  05/2013  . CERVICAL BIOPSY  W/ LOOP ELECTRODE EXCISION    . CERVICAL CONIZATION W/BX N/A 09/26/2014   Procedure: CONIZATION CERVIX WITH BIOPSY;  Surgeon: Emily Filbert, MD;  Location: [facility];  Service: Gynecology;  Laterality: N/A;  . LAPAROSCOPIC BILATERAL SALPINGECTOMY Bilateral 09/26/2014   Procedure: LAPAROSCOPIC BILATERAL SALPINGECTOMY;  Surgeon: Emily Filbert, MD;  Location: [place] [facility];   Service: Gynecology;  Laterality: Bilateral;     OB History    Gravida  3   Para  2   Term  2   Preterm      AB  1   Living  2     SAB  1   TAB      Ectopic      Multiple      Live Births  2            Home Medications    Prior to Admission medications   Medication Sig Start Date End Date Taking? Authorizing Provider  benzonatate (TESSALON) 100 MG capsule Take 2 capsules (200 mg total) by mouth 3 (three) times daily as needed. 09/07/18   Evalee Jefferson, PA-C  cephALEXin (KEFLEX) 500 MG capsule Take 1 capsule (500 mg total) by mouth 4 (four) times daily. Patient not taking: Reported on 07/28/2018 03/12/18   Kinnie Feil, PA-C  dexamethasone (DECADRON) 4 MG tablet Take 1 tablet (4 mg total) by mouth 2 (two) times daily with a meal. Patient not taking: Reported on 07/28/2018 05/14/18   Lily Kocher, PA-C  doxycycline (VIBRAMYCIN) 100 MG capsule Take 1 capsule (100 mg total) by mouth 2 (two) times daily. Patient not taking: Reported on 09/07/2018 08/16/18   Lily Kocher, PA-C  HYDROcodone-homatropine [facility]) 5-1.5 MG/5ML syrup Take 5 mLs by mouth every 6 (six) hours as needed. Patient not taking:  Reported on 09/07/2018 08/16/18   Lily Kocher, PA-C  loperamide (IMODIUM) 2 MG capsule Take 1 capsule (2 mg total) by mouth 4 (four) times daily as needed for diarrhea or loose stools. 09/07/18   Evalee Jefferson, PA-C  loratadine-pseudoephedrine (CLARITIN-D 12 HOUR) 5-120 MG tablet Take 1 tablet by mouth 2 (two) times daily. Patient not taking: Reported on 07/28/2018 05/14/18   Lily Kocher, PA-C  metroNIDAZOLE (FLAGYL) 500 MG tablet Take 1 tablet (500 mg total) by mouth 2 (two) times daily. Patient not taking: Reported on 03/12/2018 12/11/17   Francine Graven, DO  ondansetron (ZOFRAN ODT) 4 MG disintegrating tablet Take 1 tablet (4 mg total) by mouth every 8 (eight) hours as needed for nausea or vomiting. 09/07/18   Jordie Schreur, Almyra Free, PA-C  ondansetron (ZOFRAN) 4 MG tablet Take 1  tablet (4 mg total) by mouth every 8 (eight) hours as needed for nausea or vomiting. Patient not taking: Reported on 07/28/2018 03/12/18   Kinnie Feil, PA-C  oseltamivir (TAMIFLU) 75 MG capsule Take 1 capsule (75 mg total) by mouth every 12 (twelve) hours. Patient not taking: Reported on 09/07/2018 08/16/18   Lily Kocher, PA-C  oxyCODONE-acetaminophen (PERCOCET/ROXICET) 5-325 MG tablet Take 1 tablet by mouth every 4 (four) hours as needed. Patient not taking: Reported on 07/28/2018 03/15/18   Evalee Jefferson, PA-C  promethazine (PHENERGAN) 25 MG suppository Place 1 suppository (25 mg total) rectally every 6 (six) hours as needed for nausea or vomiting. Patient not taking: Reported on 07/28/2018 03/15/18   Evalee Jefferson, PA-C    Family History Family History  Problem Relation Age of Onset  . Hypertension Father   . Diabetes Brother     Social History Social History   Tobacco Use  . Smoking status: Current Every Day Smoker    Packs/day: 0.25    Years: 10.00    Pack years: 2.50    Types: Cigarettes  . Smokeless tobacco: Never Used  Substance Use Topics  . Alcohol use: No  . Drug use: No     Allergies   Penicillins   Review of Systems Review of Systems  Constitutional: Positive for fever.  HENT: Positive for rhinorrhea. Negative for congestion and sore throat.   Eyes: Positive for itching.  Respiratory: Positive for cough. Negative for chest tightness and shortness of breath.   Cardiovascular: Negative for chest pain.  Gastrointestinal: Positive for abdominal pain, diarrhea, nausea and vomiting.  Genitourinary: Negative.   Musculoskeletal: Negative for arthralgias, joint swelling and neck pain.  Skin: Negative.  Negative for rash and wound.  Neurological: Negative for dizziness, weakness, light-headedness, numbness and headaches.  Psychiatric/Behavioral: Negative.      Physical Exam Updated Vital Signs BP 135/76   Pulse 79   Temp 98.9 F (37.2 C) (Oral)   Resp 16    Ht 6' (1.829 m)   Wt 109 kg   SpO2 100%   BMI 32.59 kg/m   Physical Exam Vitals signs and nursing note reviewed.  Constitutional:      Appearance: She is well-developed.  HENT:     Head: Normocephalic and atraumatic.  Eyes:     Conjunctiva/sclera: Conjunctivae normal.  Neck:     Musculoskeletal: Normal range of motion.  Cardiovascular:     Rate and Rhythm: Normal rate and regular rhythm.     Heart sounds: Normal heart sounds.  Pulmonary:     Effort: Pulmonary effort is normal.     Breath sounds: Normal breath sounds. No wheezing.  Abdominal:  General: Bowel sounds are normal.     Palpations: Abdomen is soft.     Tenderness: There is no abdominal tenderness.  Musculoskeletal: Normal range of motion.  Skin:    General: Skin is warm and dry.  Neurological:     Mental Status: She is alert.      ED Treatments / Results  Labs (all labs ordered are listed, but only abnormal results are displayed) Labs Reviewed  COMPREHENSIVE METABOLIC PANEL - Abnormal; Notable for the following components:      Result Value   Glucose, Bld 101 (*)    All other components within normal limits  URINALYSIS, ROUTINE W REFLEX MICROSCOPIC - Abnormal; Notable for the following components:   Hgb urine dipstick MODERATE (*)    Bacteria, UA RARE (*)    All other components within normal limits  INFLUENZA PANEL BY PCR (TYPE A & B)  CBC WITH DIFFERENTIAL/PLATELET  PREGNANCY, URINE  CBC WITH DIFFERENTIAL/PLATELET    EKG None  Radiology Dg Chest Portable 1 View  Result Date: 09/07/2018 CLINICAL DATA:  Cough, fever. EXAM: PORTABLE CHEST 1 VIEW COMPARISON:  Radiographs of August 16, 2018. FINDINGS: The heart size and mediastinal contours are within normal limits. Both lungs are clear. The visualized skeletal structures are unremarkable. IMPRESSION: No active disease. Electronically Signed   By: Marijo Conception, M.D.   On: 09/07/2018 09:45    Procedures Procedures (including critical care time)   Medications Ordered in ED Medications  benzonatate (TESSALON) capsule 200 mg (200 mg Oral Given 09/07/18 0912)  ondansetron (ZOFRAN) injection 4 mg (4 mg Intravenous Given 09/07/18 0913)  albuterol (PROVENTIL HFA;VENTOLIN HFA) 108 (90 Base) MCG/ACT inhaler 2 puff (2 puffs Inhalation Given 09/07/18 0913)     Initial Impression / Assessment and Plan / ED Course  I have reviewed the triage vital signs and the nursing notes.  Pertinent labs & imaging results that were available during my care of the patient were reviewed by me and considered in my medical decision making (see chart for details).        Labs reviewed and stable.  UA does reflect some hemoglobin, however she is on the end of her menstrual cycle.  No urinary symptoms present.  Patient was prescribed Zofran and imodiumfor her GI symptoms as this appears to be a viral GI process, no indication that this is C. difficile or bacterial.  Her respiratory symptoms may be related to allergy, however she has had potential exposure with her air travel to the cold with virus.  Her flu swab was negative, COVID test was added to this already used flu swab.   I discussed her case with infection prevention and they recommended 7 days of home isolation, adding another 72 hours if her fever persists.  Symptomatic treatment, recheck here for any worsening shortness of breath.  10:52 AM Pt still has nausea, will try phenergan prior to dc home. Final Clinical Impressions(s) / ED Diagnoses   Final diagnoses:  Nausea, vomiting and diarrhea  Cough    ED Discharge Orders         Ordered    benzonatate (TESSALON) 100 MG capsule  3 times daily PRN     09/07/18 1019    ondansetron (ZOFRAN ODT) 4 MG disintegrating tablet  Every 8 hours PRN     09/07/18 1019    loperamide (IMODIUM) 2 MG capsule  4 times daily PRN     09/07/18 1019  Evalee Jefferson, PA-C 09/07/18 [place], Neha Waight, PA-C 09/07/18 [place], Gardiner, DO  09/12/18 Mirian Capuchin

## 2018-09-07 NOTE — Discharge Instructions (Signed)
Rest and make sure you are drinking plenty of fluids.  Continue using the Imodium if you continue to have diarrhea.  You have also been prescribed Zofran to help you with the nausea and Tessalon for your cough.  You may also benefit from adding an antihistamine such as Claritin or Zyrtec to help you with your suspected allergy-like symptoms.  It is recommended that you self quarantine yourself for the next 7 days, after which if your symptoms are resolved, especially your fever, you may return to normal activities.  If you are still running a fever at the end of 7 days you should continue to isolate yourself until the fever is resolved.  Return here for any worsened symptoms, especially shortness of breath.  You may use the inhaler given 2 puffs every 4 hours if needed if you develop any wheezing.

## 2018-09-07 NOTE — ED Triage Notes (Signed)
Pt reports flew to [place] to be in the hospital with her brother who passed away.  Pt says she doesn't think shes been around anyone with covid.  Reports fever, n/v/d, cough, congestion since last night. Placed pt on droplet and contact precautions.

## 2018-09-10 LAB — NOVEL CORONAVIRUS, NAA (HOSP ORDER, SEND-OUT TO REF LAB; TAT 18-24 HRS): SARS-CoV-2, NAA: NOT DETECTED

## 2018-12-13 ENCOUNTER — Encounter (HOSPITAL_COMMUNITY): Payer: Self-pay | Admitting: *Deleted

## 2018-12-13 ENCOUNTER — Emergency Department (HOSPITAL_COMMUNITY): Payer: Self-pay

## 2018-12-13 ENCOUNTER — Other Ambulatory Visit: Payer: Self-pay

## 2018-12-13 ENCOUNTER — Emergency Department (HOSPITAL_COMMUNITY)
Admission: EM | Admit: 2018-12-13 | Discharge: 2018-12-13 | Disposition: A | Discharge status: Home / Self Care | Payer: Self-pay | Attending: Emergency Medicine | Admitting: Emergency Medicine

## 2018-12-13 DIAGNOSIS — R109 Unspecified abdominal pain: Secondary | ICD-10-CM | POA: Insufficient documentation

## 2018-12-13 DIAGNOSIS — F17210 Nicotine dependence, cigarettes, uncomplicated: Secondary | ICD-10-CM | POA: Insufficient documentation

## 2018-12-13 DIAGNOSIS — R110 Nausea: Secondary | ICD-10-CM | POA: Insufficient documentation

## 2018-12-13 LAB — PREGNANCY, URINE: Preg Test, Ur: NEGATIVE

## 2018-12-13 LAB — URINALYSIS, ROUTINE W REFLEX MICROSCOPIC
Bilirubin Urine: NEGATIVE
Glucose, UA: NEGATIVE mg/dL
Ketones, ur: NEGATIVE mg/dL
Leukocytes,Ua: NEGATIVE
Nitrite: NEGATIVE
Protein, ur: NEGATIVE mg/dL
Specific Gravity, Urine: 1.025 (ref 1.005–1.030)
pH: 5 (ref 5.0–8.0)

## 2018-12-13 MED ORDER — NAPROXEN 250 MG PO TABS: 250.0000 mg | ORAL_TABLET | Freq: Two times a day (BID) | ORAL | 0 refills | Status: DC | PRN

## 2018-12-13 MED ORDER — METHOCARBAMOL 500 MG PO TABS: 1000.0000 mg | ORAL_TABLET | Freq: Four times a day (QID) | ORAL | 0 refills | Status: DC | PRN

## 2018-12-13 NOTE — Discharge Instructions (Signed)
Take the prescriptions as directed. Take over the counter tylenol, as directed on packaging, as needed for discomfort. Apply moist heat or ice to the area(s) of discomfort, for 15 minutes at a time, several times per day for the next few days.  Do not fall asleep on a heating or ice pack.  Call your regular medical doctor today to schedule a follow up appointment this week.  Return to the Emergency Department immediately if worsening.

## 2018-12-13 NOTE — ED Triage Notes (Signed)
Pt c/o left flank pain that radiates around to left lower abdomen x 2 days. Pt denies any new urinary symptoms. Pt reports she normally has increased urination. Denies fever, hematuria.

## 2018-12-13 NOTE — Clinical Social Work Note (Signed)
Met with Wendy Singh to offer referral to [facility].  She accepted, spoke with Diane at [facility], and has a follow up appointment with them.

## 2018-12-13 NOTE — ED Provider Notes (Signed)
[facility] Provider Note   CSN: [id number] Arrival date & time: 12/13/18  2831     History   Chief Complaint Chief Complaint  Patient presents with   Flank Pain    HPI Wendy Singh is a 43 y.o. female.     HPI  Pt was seen at 0750. Per pt, c/o sudden onset and persistence of constant left sided flank "pain" that began 3 days ago.  Pt describes the pain as "sharp" and "maybe like my last kidney stone," and radiating into the left side of her abd.  Has been associated with nausea.  Denies vaginal bleeding/discharge, no dysuria/hematuria, no abd pain, no diarrhea, no black or blood in stools, no CP/SOB, no cough, no fevers, no rash, no injury.    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   HSIL (high grade squamous intraepithelial lesion) on Pap smear of cervix 01/09/2014    Past Surgical History:  Procedure Laterality Date   BREAST BIOPSY  05/2013   CERVICAL BIOPSY  W/ LOOP ELECTRODE EXCISION     CERVICAL CONIZATION W/BX N/A 09/26/2014   Procedure: CONIZATION CERVIX WITH BIOPSY;  Surgeon: Emily Filbert, MD;  Location: [place] [facility];  Service: Gynecology;  Laterality: N/A;   LAPAROSCOPIC BILATERAL SALPINGECTOMY Bilateral 09/26/2014   Procedure: LAPAROSCOPIC BILATERAL SALPINGECTOMY;  Surgeon: Emily Filbert, MD;  Location: [facility];  Service: Gynecology;  Laterality: Bilateral;     OB History    Gravida  3   Para  2   Term  2   Preterm      AB  1   Living  2     SAB  1   TAB      Ectopic      Multiple      Live Births  2            Home Medications    Prior to Admission medications   Medication Sig Start Date End Date Taking? Authorizing Provider  benzonatate (TESSALON) 100 MG capsule Take 2 capsules (200 mg total) by mouth  3 (three) times daily as needed. 09/07/18   Evalee Jefferson, PA-C  cephALEXin (KEFLEX) 500 MG capsule Take 1 capsule (500 mg total) by mouth 4 (four) times daily. Patient not taking: Reported on 07/28/2018 03/12/18   Kinnie Feil, PA-C  dexamethasone (DECADRON) 4 MG tablet Take 1 tablet (4 mg total) by mouth 2 (two) times daily with a meal. Patient not taking: Reported on 07/28/2018 05/14/18   Lily Kocher, PA-C  doxycycline (VIBRAMYCIN) 100 MG capsule Take 1 capsule (100 mg total) by mouth 2 (two) times daily. Patient not taking: Reported on 09/07/2018 08/16/18   Lily Kocher, PA-C  HYDROcodone-homatropine [facility]) 5-1.5 MG/5ML syrup Take 5 mLs by mouth every 6 (six) hours as needed. Patient not taking: Reported on 09/07/2018 08/16/18   Lily Kocher, PA-C  loperamide (IMODIUM) 2 MG capsule Take 1 capsule (2 mg total) by mouth 4 (four) times daily as needed for diarrhea or loose stools. 09/07/18   Evalee Jefferson, PA-C  loratadine-pseudoephedrine (CLARITIN-D 12 HOUR) 5-120 MG tablet Take 1 tablet by mouth 2 (two) times daily. Patient not taking: Reported on 07/28/2018 05/14/18   Lily Kocher, PA-C  metroNIDAZOLE (FLAGYL) 500 MG tablet Take 1 tablet (500 mg total) by mouth 2 (two) times daily. Patient not taking: Reported on 03/12/2018 12/11/17   Francine Graven, DO  ondansetron (ZOFRAN) 4 MG tablet Take 1 tablet (4 mg total) by mouth every 8 (eight) hours as needed for nausea or vomiting. Patient not taking: Reported on 07/28/2018 03/12/18   Kinnie Feil, PA-C  oseltamivir (TAMIFLU) 75 MG capsule Take 1 capsule (75 mg total) by mouth every 12 (twelve) hours. Patient not taking: Reported on 09/07/2018 08/16/18   Lily Kocher, PA-C  oxyCODONE-acetaminophen (PERCOCET/ROXICET) 5-325 MG tablet Take 1 tablet by mouth every 4 (four) hours as needed. Patient not taking: Reported on 07/28/2018 03/15/18   Evalee Jefferson, PA-C  promethazine (PHENERGAN) 25 MG tablet Take 1 tablet (25 mg total) by mouth every 6  (six) hours as needed for nausea or vomiting. 09/07/18   Evalee Jefferson, PA-C    Family History Family History  Problem Relation Age of Onset   Hypertension Father    Diabetes Brother     Social History Social History   Tobacco Use   Smoking status: Current Every Day Smoker    Packs/day: 0.25    Years: 10.00    Pack years: 2.50    Types: Cigarettes   Smokeless tobacco: Never Used  Substance Use Topics   Alcohol use: No   Drug use: No     Allergies   Penicillins   Review of Systems Review of Systems ROS: Statement: All systems negative except as marked or noted in the HPI; Constitutional: Negative for fever and chills. ; ; Eyes: Negative for eye pain, redness and discharge. ; ; ENMT: Negative for ear pain, hoarseness, nasal congestion, sinus pressure and sore throat. ; ; Cardiovascular: Negative for chest pain, palpitations, diaphoresis, dyspnea and peripheral edema. ; ; Respiratory: Negative for cough, wheezing and stridor. ; ; Gastrointestinal: +nausea. Negative for vomiting, diarrhea, abdominal pain, blood in stool, hematemesis, jaundice and rectal bleeding. . ; ; Genitourinary: Negative for dysuria, flank pain and hematuria. ; ; GYN:  No pelvic pain, no vaginal bleeding, no vaginal discharge, no vulvar pain. ;; Musculoskeletal: +back pain. Negative for neck pain. Negative for swelling and trauma.; ; Skin: Negative for pruritus, rash, abrasions, blisters, bruising and skin lesion.; ; Neuro: Negative for headache, lightheadedness and neck stiffness. Negative for weakness, altered level of consciousness, altered mental status, extremity weakness, paresthesias, involuntary movement, seizure and syncope.       Physical Exam Updated Vital Signs BP 123/80 (BP Location: Right Arm)    Pulse 70    Temp 98.1 F (36.7 C) (Oral)    Resp 18    Ht 6' (1.829 m)    Wt 95 kg    LMP 12/01/2018    SpO2 99%    BMI 28.40 kg/m   Physical Exam 0755: Physical examination:  Nursing notes  reviewed; Vital signs and O2 SAT reviewed;  Constitutional: Well developed, Well nourished, Well hydrated, In no acute distress; Head:  Normocephalic, atraumatic; Eyes: EOMI, PERRL, No scleral icterus; ENMT: Mouth and pharynx normal, Mucous membranes moist; Neck: Supple, Full range of motion, No lymphadenopathy; Cardiovascular: Regular rate and rhythm, No gallop; Respiratory: Breath sounds clear & equal bilaterally, No wheezes.  Speaking full sentences with ease, Normal respiratory effort/excursion; Chest: Nontender, Movement normal; Abdomen: Soft, Nontender, Nondistended, Normal bowel sounds; Genitourinary: No CVA tenderness; Spine:  No midline CS, TS, LS tenderness. +TTP left lumbar paraspinal muscles.;; Extremities: Peripheral pulses normal, No tenderness, No edema,  No calf edema or asymmetry.; Neuro: AA&Ox3, Major CN grossly intact.  Speech clear. No gross focal motor or sensory deficits in extremities. Strength 5/5 equal bilat UE's and LE's, including great toe dorsiflexion.  DTR 2/4 equal bilat UE's and LE's.  No gross sensory deficits.  Neg straight leg raises bilat.;;.; Skin: Color normal, Warm, Dry.   ED Treatments / Results  Labs (all labs ordered are listed, but only abnormal results are displayed)   EKG    Radiology   Procedures Procedures (including critical care time)  Medications Ordered in ED Medications - No data to display   Initial Impression / Assessment and Plan / ED Course  I have reviewed the triage vital signs and the nursing notes.  Pertinent labs & imaging results that were available during my care of the patient were reviewed by me and considered in my medical decision making (see chart for details).     MDM Reviewed: previous chart, nursing note and vitals Reviewed previous: CT scan Interpretation: labs and CT scan    Results for orders placed or performed during the hospital encounter of 12/13/18  Pregnancy, urine  Result Value Ref Range   Preg Test,  Ur NEGATIVE NEGATIVE  Urinalysis, Routine w reflex microscopic  Result Value Ref Range   Color, Urine YELLOW YELLOW   APPearance CLOUDY (A) CLEAR   Specific Gravity, Urine 1.025 1.005 - 1.030   pH 5.0 5.0 - 8.0   Glucose, UA NEGATIVE NEGATIVE mg/dL   Hgb urine dipstick SMALL (A) NEGATIVE   Bilirubin Urine NEGATIVE NEGATIVE   Ketones, ur NEGATIVE NEGATIVE mg/dL   Protein, ur NEGATIVE NEGATIVE mg/dL   Nitrite NEGATIVE NEGATIVE   Leukocytes,Ua NEGATIVE NEGATIVE   RBC / HPF 0-5 0 - 5 RBC/hpf   WBC, UA 0-5 0 - 5 WBC/hpf   Bacteria, UA RARE (A) NONE SEEN   Squamous Epithelial / LPF 21-50 0 - 5   Mucus PRESENT    Ct Renal Stone Study Result Date: 12/13/2018 CLINICAL DATA:  Left flank pain.  History of kidney stones. EXAM: CT ABDOMEN AND PELVIS WITHOUT CONTRAST TECHNIQUE: Multidetector CT imaging of the abdomen and pelvis was performed following the standard protocol without IV contrast. COMPARISON:  CT scan dated 03/12/2018 and 12/11/2017 FINDINGS: Lower chest: Normal. Hepatobiliary: 6 mm low-density lesion in the posterior aspect of the right lobe, consistent with a small cyst. Liver parenchyma and biliary tree are otherwise normal. Pancreas: Unremarkable. No pancreatic ductal dilatation or surrounding inflammatory changes. Spleen: Normal in size without focal abnormality. Adrenals/Urinary Tract: 18 x 12 mm low-density lesion in the left adrenal gland consistent with a benign adenoma. Right adrenal gland is normal. Kidneys appear normal. No hydronephrosis. No ureteral or bladder calculi. Numerous phleboliths in the pelvis. Stomach/Bowel: Stomach is within normal limits. Appendix appears normal. No evidence of bowel wall thickening, distention, or inflammatory changes. Vascular/Lymphatic: No significant vascular findings are present. No enlarged abdominal or pelvic lymph nodes. Reproductive: 14 mm exophytic fibroid on the otherwise normal uterus. Ovaries are normal. Other: No abdominal wall hernia or  abnormality. No abdominopelvic ascites. Musculoskeletal: No acute or significant osseous findings. IMPRESSION: Benign-appearing abdomen and pelvis. Specifically, no evidence of renal, ureteral, or bladder calculi. Electronically Signed   By: Lorriane Shire M.D.   On: 12/13/2018 08:53    0920:  Udip contaminated and pt denies dysuria. Workup reassuring. Tx symptomatically at this time. Dx and testing, as well as incidental finding(s), d/w pt.  Questions answered.  Verb understanding, agreeable to  d/c home with outpt f/u.     Final Clinical Impressions(s) / ED Diagnoses   Final diagnoses:  None    ED Discharge Orders    None       Francine Graven, DO 12/17/18 3532

## 2018-12-15 ENCOUNTER — Other Ambulatory Visit: Payer: Self-pay

## 2018-12-15 ENCOUNTER — Emergency Department (HOSPITAL_COMMUNITY)
Admission: EM | Admit: 2018-12-15 | Discharge: 2018-12-15 | Disposition: A | Discharge status: Home / Self Care | Payer: Self-pay | Attending: Emergency Medicine | Admitting: Emergency Medicine

## 2018-12-15 ENCOUNTER — Encounter (HOSPITAL_COMMUNITY): Payer: Self-pay | Admitting: Emergency Medicine

## 2018-12-15 DIAGNOSIS — Z79899 Other long term (current) drug therapy: Secondary | ICD-10-CM | POA: Insufficient documentation

## 2018-12-15 DIAGNOSIS — F17210 Nicotine dependence, cigarettes, uncomplicated: Secondary | ICD-10-CM | POA: Insufficient documentation

## 2018-12-15 DIAGNOSIS — M545 Low back pain, unspecified: Secondary | ICD-10-CM

## 2018-12-15 MED ORDER — HYDROCODONE-ACETAMINOPHEN 5-325 MG PO TABS: 1.0000 | ORAL_TABLET | Freq: Four times a day (QID) | ORAL | 0 refills | Status: DC | PRN

## 2018-12-15 NOTE — ED Triage Notes (Signed)
Pt c/o LT lower back pain that radiates to LT lower abdomen. Pt seen on Tuesday for same complaint, but has experienced no improvement. Reports n/v, but only when she takes her flexeril she was prescribed at last visit. Denies diarrhea, GU symptoms, or vaginal bleeding.

## 2018-12-15 NOTE — Discharge Instructions (Addendum)
Recommend rest for the next few days.  Take the hydrocodone as needed for pain.  Work note provided.  Return for any new or worse symptoms.  CT scan was reviewed from the other day without any evidence of any acute findings in the abdomen.  Also urinalysis was very normal.  Suspect symptoms are secondary to musculoskeletal back pain.

## 2018-12-15 NOTE — ED Provider Notes (Signed)
[facility] EMERGENCY DEPARTMENT Provider Note   CSN: [id number] Arrival date & time: 12/15/18  0751     History   Chief Complaint Chief Complaint  Patient presents with  . Back Pain    HPI Wendy Singh is a 43 y.o. female.     Patient seen June 30 for concerns for left flank pain.  Pain is also in the back area radiates to the buttocks.  Does also radiate to the anterior part of the abdomen left lower quadrant.  Patient states that occasionally gets some right lower quadrant discomfort.  Patient had CT renal study without any acute findings in the abdomen.  There was evidence of a uterine fibroid.  No evidence of stone.  Patient's urinalysis was completely normal.  And pregnancy test was negative.  Patient was treated with muscle relaxers and nonsteroidal medications.  Patient states they are not helping.  She was at work last night and has increased pain with walking and movement.  No nausea no vomiting no dysuria no fevers no upper respiratory symptoms.  No numbness or weakness to the left foot.     Past Medical History:  Diagnosis Date  . Anemia   . Depression   . Kidney stones   . Migraines   . Ovarian cyst   . Vaginal Pap smear, abnormal     Patient Active Problem List   Diagnosis Date Noted  . 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 HSIL (high grade squamous intraepithelial lesion) on Pap smear of cervix 01/09/2014    Past Surgical History:  Procedure Laterality Date  . BREAST BIOPSY  05/2013  . CERVICAL BIOPSY  W/ LOOP ELECTRODE EXCISION    . CERVICAL CONIZATION W/BX N/A 09/26/2014   Procedure: CONIZATION CERVIX WITH BIOPSY;  Surgeon: Emily Filbert, MD;  Location: [facility];  Service: Gynecology;  Laterality: N/A;  . LAPAROSCOPIC BILATERAL SALPINGECTOMY Bilateral 09/26/2014   Procedure: LAPAROSCOPIC BILATERAL SALPINGECTOMY;  Surgeon: Emily Filbert, MD;  Location: [facility];  Service: Gynecology;  Laterality: Bilateral;     OB History    Gravida  3   Para  2    Term  2   Preterm      AB  1   Living  2     SAB  1   TAB      Ectopic      Multiple      Live Births  2            Home Medications    Prior to Admission medications   Medication Sig Start Date End Date Taking? Authorizing Provider  benzonatate (TESSALON) 100 MG capsule Take 2 capsules (200 mg total) by mouth 3 (three) times daily as needed. 09/07/18   Evalee Jefferson, PA-C  cephALEXin (KEFLEX) 500 MG capsule Take 1 capsule (500 mg total) by mouth 4 (four) times daily. Patient not taking: Reported on 07/28/2018 03/12/18   Kinnie Feil, PA-C  dexamethasone (DECADRON) 4 MG tablet Take 1 tablet (4 mg total) by mouth 2 (two) times daily with a meal. Patient not taking: Reported on 07/28/2018 05/14/18   Lily Kocher, PA-C  doxycycline (VIBRAMYCIN) 100 MG capsule Take 1 capsule (100 mg total) by mouth 2 (two) times daily. Patient not taking: Reported on 09/07/2018 08/16/18   Lily Kocher, PA-C  HYDROcodone-acetaminophen (NORCO/VICODIN) 5-325 MG tablet Take 1 tablet by mouth every 6 (six) hours as needed for moderate pain. 12/15/18   Fredia Sorrow, MD  HYDROcodone-homatropine [facility]) 5-1.5  MG/5ML syrup Take 5 mLs by mouth every 6 (six) hours as needed. Patient not taking: Reported on 09/07/2018 08/16/18   Lily Kocher, PA-C  loperamide (IMODIUM) 2 MG capsule Take 1 capsule (2 mg total) by mouth 4 (four) times daily as needed for diarrhea or loose stools. 09/07/18   Evalee Jefferson, PA-C  loratadine-pseudoephedrine (CLARITIN-D 12 HOUR) 5-120 MG tablet Take 1 tablet by mouth 2 (two) times daily. Patient not taking: Reported on 07/28/2018 05/14/18   Lily Kocher, PA-C  methocarbamol (ROBAXIN) 500 MG tablet Take 2 tablets (1,000 mg total) by mouth 4 (four) times daily as needed for muscle spasms (muscle spasm/pain). 12/13/18   Francine Graven, DO  metroNIDAZOLE (FLAGYL) 500 MG tablet Take 1 tablet (500 mg total) by mouth 2 (two) times daily. Patient not taking: Reported on 03/12/2018  12/11/17   Francine Graven, DO  naproxen (NAPROSYN) 250 MG tablet Take 1 tablet (250 mg total) by mouth 2 (two) times daily as needed for mild pain or moderate pain (take with food). 12/13/18   Francine Graven, DO  ondansetron (ZOFRAN) 4 MG tablet Take 1 tablet (4 mg total) by mouth every 8 (eight) hours as needed for nausea or vomiting. Patient not taking: Reported on 07/28/2018 03/12/18   Kinnie Feil, PA-C  oseltamivir (TAMIFLU) 75 MG capsule Take 1 capsule (75 mg total) by mouth every 12 (twelve) hours. Patient not taking: Reported on 09/07/2018 08/16/18   Lily Kocher, PA-C  oxyCODONE-acetaminophen (PERCOCET/ROXICET) 5-325 MG tablet Take 1 tablet by mouth every 4 (four) hours as needed. Patient not taking: Reported on 07/28/2018 03/15/18   Evalee Jefferson, PA-C  promethazine (PHENERGAN) 25 MG tablet Take 1 tablet (25 mg total) by mouth every 6 (six) hours as needed for nausea or vomiting. 09/07/18   Evalee Jefferson, PA-C    Family History Family History  Problem Relation Age of Onset  . Hypertension Father   . Diabetes Brother     Social History Social History   Tobacco Use  . Smoking status: Current Every Day Smoker    Packs/day: 0.25    Years: 10.00    Pack years: 2.50    Types: Cigarettes  . Smokeless tobacco: Never Used  Substance Use Topics  . Alcohol use: No  . Drug use: No     Allergies   Penicillins   Review of Systems Review of Systems  Constitutional: Negative for chills and fever.  HENT: Negative for rhinorrhea and sore throat.   Eyes: Negative for visual disturbance.  Respiratory: Negative for cough and shortness of breath.   Cardiovascular: Negative for chest pain and leg swelling.  Gastrointestinal: Negative for abdominal pain, diarrhea, nausea and vomiting.  Genitourinary: Positive for flank pain. Negative for dysuria.  Musculoskeletal: Positive for back pain. Negative for neck pain.  Skin: Negative for rash.  Neurological: Negative for dizziness,  weakness, light-headedness, numbness and headaches.  Hematological: Does not bruise/bleed easily.  Psychiatric/Behavioral: Negative for confusion.     Physical Exam Updated Vital Signs Ht 1.829 m (6')   Wt 99.8 kg   LMP 12/01/2018 Comment: upreg negative  BMI 29.84 kg/m   Physical Exam Vitals signs and nursing note reviewed.  Constitutional:      General: She is not in acute distress.    Appearance: Normal appearance. She is well-developed.  HENT:     Head: Normocephalic and atraumatic.  Eyes:     Extraocular Movements: Extraocular movements intact.     Conjunctiva/sclera: Conjunctivae normal.     Pupils: Pupils  are equal, round, and reactive to light.  Neck:     Musculoskeletal: Neck supple.  Cardiovascular:     Rate and Rhythm: Normal rate and regular rhythm.     Heart sounds: No murmur.  Pulmonary:     Effort: Pulmonary effort is normal. No respiratory distress.     Breath sounds: Normal breath sounds.  Abdominal:     Palpations: Abdomen is soft.     Tenderness: There is no abdominal tenderness.  Musculoskeletal: Normal range of motion.        General: No swelling.  Skin:    General: Skin is warm and dry.     Capillary Refill: Capillary refill takes less than 2 seconds.  Neurological:     General: No focal deficit present.     Mental Status: She is alert and oriented to person, place, and time.      ED Treatments / Results  Labs (all labs ordered are listed, but only abnormal results are displayed) Labs Reviewed - No data to display  EKG EKG Interpretation  Date/Time:  Thursday December 15 2018 08:45:26 EDT Ventricular Rate:  71 PR Interval:    QRS Duration: 96 QT Interval:  396 QTC Calculation: 431 R Axis:   53 Text Interpretation:  Sinus rhythm Confirmed by Fredia Sorrow [phone]) on 12/15/2018 8:53:51 AM   Radiology No results found.  Procedures Procedures (including critical care time)  Medications Ordered in ED Medications - No data to  display   Initial Impression / Assessment and Plan / ED Course  I have reviewed the triage vital signs and the nursing notes.  Pertinent labs & imaging results that were available during my care of the patient were reviewed by me and considered in my medical decision making (see chart for details).        Reviewed patient CT scan and urinalysis.  Urinalysis very normal no concerns for urinary tract infection.  CT scan very unremarkable.  Based on the fact that it hurts more to move and walk suspect that is musculoskeletal.  Patient states that the NSAIDs upset her stomach and the Flexeril upsets her stomach as well.  Will give short course of hydrocodone.  Database was reviewed.  Patient has not received any narcotics since March.  Work note provided take her out of work for the next 2 days.  Patient instructed the rest is much as possible.  No evidence of sciatica.  Final Clinical Impressions(s) / ED Diagnoses   Final diagnoses:  Acute left-sided low back pain without sciatica    ED Discharge Orders         Ordered    HYDROcodone-acetaminophen (NORCO/VICODIN) 5-325 MG tablet  Every 6 hours PRN     12/15/18 0901           Fredia Sorrow, MD 12/15/18 [phone]

## 2018-12-26 ENCOUNTER — Ambulatory Visit: Payer: Self-pay | Admitting: Physician Assistant

## 2018-12-26 ENCOUNTER — Encounter: Payer: Self-pay | Admitting: Physician Assistant

## 2018-12-26 ENCOUNTER — Telehealth: Payer: Self-pay | Admitting: *Deleted

## 2018-12-26 DIAGNOSIS — F17200 Nicotine dependence, unspecified, uncomplicated: Secondary | ICD-10-CM

## 2018-12-26 DIAGNOSIS — R509 Fever, unspecified: Secondary | ICD-10-CM

## 2018-12-26 DIAGNOSIS — Z20822 Contact with and (suspected) exposure to covid-19: Secondary | ICD-10-CM

## 2018-12-26 DIAGNOSIS — Z7689 Persons encountering health services in other specified circumstances: Secondary | ICD-10-CM

## 2018-12-26 DIAGNOSIS — R109 Unspecified abdominal pain: Secondary | ICD-10-CM

## 2018-12-26 DIAGNOSIS — R112 Nausea with vomiting, unspecified: Secondary | ICD-10-CM

## 2018-12-26 MED ORDER — SULFAMETHOXAZOLE-TRIMETHOPRIM 800-160 MG PO TABS: 1.0000 | ORAL_TABLET | Freq: Two times a day (BID) | ORAL | 0 refills | Status: DC

## 2018-12-26 NOTE — Telephone Encounter (Signed)
-----   Message from Soyla Dryer, [place] sent at 12/26/2018  1:52 PM EDT ----- Ms Wendy Singh needs coronavirus testing.  She has fever to 101 today.  Her contact info:   [address]  [address]  [place] [address] [phone] (M)     Thank you. Soyla Dryer, PA-C [facility] [place] [phone]

## 2018-12-26 NOTE — Telephone Encounter (Signed)
LM for pt to return call @ [phone] M-F 7a-7p to schedule covid testing. Order placed  

## 2018-12-26 NOTE — Progress Notes (Signed)
LMP 12/01/2018 Comment: upreg negative   Subjective:    Patient ID: Wendy Singh, female    DOB: 07-14-1975, 43 y.o.   MRN: [id number]  HPI: Wendy Singh is a 43 y.o. female presenting on 12/26/2018 for New Patient (Initial Visit) and Vomiting (and fever over 101 F around 7:30am this morning. pt states she is having low back pain and went to [facility] - ER pt thinks she may have kidney stones.pt has been taking tylenol. sx began about 2 weeks ago. )   HPI   Chief Complaint  Patient presents with  . New Patient (Initial Visit)  . Vomiting    and fever over 101 F around 7:30am this morning. pt states she is having low back pain and went to [facility] - ER pt thinks she may have kidney stones.pt has been taking tylenol. sx began about 2 weeks ago.       This is a telemedicine appointment through [employer] due to coronavirus pandemic  I connected with  Wendy Singh on 12/26/18 by a video enabled telemedicine application and verified that I am speaking with the correct person using two identifiers.   I discussed the limitations of evaluation and management by telemedicine. The patient expressed understanding and agreed to proceed.  Pt is at home.  Provider is at office    Pt says she had Fever 101 this morning.    Pt with suspected kidney stones- been to ER twice in past several weeks.   Pt with back pain with radiation to the abd.  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    Pt relates Some n/v this am- none presently.   Emesis x 2 this morning.  None yesterday.   She has No dysuria  Currently worst symptom is back pain- L flank.   her BMs are "pretty normal"  No blood or diarrhea (had some loose stool at initial onset of pain around 12/13/18)  She says she is Eating okay.    Her LMP- is due but not late - she thinks LMP about June 15  She has No gas.      Relevant past medical, surgical, family and social history reviewed and updated as indicated. Interim  medical history since our last visit reviewed. Allergies and medications reviewed and updated.   Current Outpatient Medications:  .  albuterol (VENTOLIN HFA) 108 (90 Base) MCG/ACT inhaler, Inhale 2 puffs into the lungs every 6 (six) hours as needed for wheezing or shortness of breath., Disp: , Rfl:      Review of Systems  Per HPI unless specifically indicated above     Objective:    LMP 12/01/2018 Comment: upreg negative  Wt Readings from Last 3 Encounters:  12/15/18 220 lb (99.8 kg)  12/13/18 209 lb 7 oz (95 kg)  09/07/18 240 lb 4.8 oz (109 kg)    Physical Exam Constitutional:      General: She is not in acute distress.    Appearance: She is obese.  HENT:     Head: Normocephalic and atraumatic.  Pulmonary:     Effort: Pulmonary effort is normal. No respiratory distress.  Neurological:     Mental Status: She is alert and oriented to person, place, and time.  Psychiatric:        Attention and Perception: Attention normal.        Speech: Speech normal.        Behavior: Behavior is cooperative.  Assessment & Plan:    Encounter Diagnoses  Name Primary?  . Fever, unspecified fever cause   . Non-intractable vomiting with nausea, unspecified vomiting type   . Flank pain   . Tobacco use disorder   . Encounter to establish care Yes      rx septra ds +/- kidney infection  Refer for coronavirus testing in light of fever (works at [employer]).  Pt counseled that because she IS SICK with FEVER, she should self-isolate until she has results of her coronavirus test and AVOID going to WORK.   F/u next week with telemedicine visit.  She is to contact office sooner prn

## 2018-12-28 ENCOUNTER — Other Ambulatory Visit: Payer: Self-pay

## 2018-12-28 DIAGNOSIS — Z20822 Contact with and (suspected) exposure to covid-19: Secondary | ICD-10-CM

## 2019-01-01 LAB — NOVEL CORONAVIRUS, NAA: SARS-CoV-2, NAA: NOT DETECTED

## 2019-01-05 ENCOUNTER — Encounter: Payer: Self-pay | Admitting: Physician Assistant

## 2019-01-05 ENCOUNTER — Ambulatory Visit: Payer: Self-pay | Admitting: Physician Assistant

## 2019-01-05 DIAGNOSIS — F17200 Nicotine dependence, unspecified, uncomplicated: Secondary | ICD-10-CM

## 2019-01-05 DIAGNOSIS — Z131 Encounter for screening for diabetes mellitus: Secondary | ICD-10-CM

## 2019-01-05 DIAGNOSIS — E669 Obesity, unspecified: Secondary | ICD-10-CM

## 2019-01-05 DIAGNOSIS — J45909 Unspecified asthma, uncomplicated: Secondary | ICD-10-CM

## 2019-01-05 DIAGNOSIS — R350 Frequency of micturition: Secondary | ICD-10-CM

## 2019-01-05 DIAGNOSIS — Z13220 Encounter for screening for lipoid disorders: Secondary | ICD-10-CM

## 2019-01-05 MED ORDER — ALBUTEROL SULFATE HFA 108 (90 BASE) MCG/ACT IN AERS: 2.0000 | INHALATION_SPRAY | Freq: Four times a day (QID) | RESPIRATORY_TRACT | 1 refills | Status: DC | PRN

## 2019-01-05 NOTE — Progress Notes (Signed)
There were no vitals taken for this visit.   Subjective:    Patient ID: Wendy Singh, female    DOB: 27-Sep-1975, 43 y.o.   MRN: [id number]  HPI: Wendy Singh is a 43 y.o. female presenting on 01/05/2019 for No chief complaint on file.   HPI  This is a telemedicine appointment through [employer] due to coronavirus pandemic.    I connected with  Wendy Singh on 01/05/19 by a video enabled telemedicine application and verified that I am speaking with the correct person using two identifiers.   I discussed the limitations of evaluation and management by telemedicine. The patient expressed understanding and agreed to proceed.  Pt is at work at [employer].  Provider is at office    She has been using her inhaler a lot- mostly when she goes out into the heat.  She says she is fine when she is inside air conditioning.  She is still smoking.  She is feeling well but her back still hurts some.  She says she is urinating a lot but no discomfort.  She stopped the septra due to nausea and diarrhea side effects.    She says she has [facility] (medication assistance)    Relevant past medical, surgical, family and social history reviewed and updated as indicated. Interim medical history since our last visit reviewed. Allergies and medications reviewed and updated.  CURRENT MEDS: none  Review of Systems  Per HPI unless specifically indicated above     Objective:    There were no vitals taken for this visit.  Wt Readings from Last 3 Encounters:  12/15/18 220 lb (99.8 kg)  12/13/18 209 lb 7 oz (95 kg)  09/07/18 240 lb 4.8 oz (109 kg)    Physical Exam Constitutional:      General: She is not in acute distress.    Appearance: Normal appearance. She is obese. She is not ill-appearing.  HENT:     Head: Normocephalic and atraumatic.  Pulmonary:     Effort: Pulmonary effort is normal. No respiratory distress.  Neurological:     Mental Status: She is alert and oriented to  person, place, and time.  Psychiatric:        Attention and Perception: Attention normal.        Mood and Affect: Mood normal.        Speech: Speech normal.        Behavior: Behavior is cooperative.        Cognition and Memory: Cognition normal.     Comments: Pt is pleasant and outgoing and is in good spirits today.      Results for orders placed or performed in visit on 12/28/18  Novel Coronavirus, NAA ([facility])  Result Value Ref Range   SARS-CoV-2, NAA Not Detected Not Detected      Assessment & Plan:   Encounter Diagnoses  Name Primary?  . Tobacco use disorder Yes  . Mild reactive airways disease, unspecified whether persistent   . Screening cholesterol level   . Screening for diabetes mellitus   . Obesity, unspecified classification, unspecified obesity type, unspecified whether serious comorbidity present   . Urinary frequency       -albuterol Inhalers rx sent to [facility]  -Pt PAP and mammogram up to date.   -will Check lipids to screen for dyslipidemia.  Also will check urinalysis.  She will be called with results  -Counseled smoking cessation  -Encouraged pt to wear a mask when she goes out in public  -Pt  to follow up 1 year.  She is to contact office sooner prn

## 2019-01-10 ENCOUNTER — Telehealth: Payer: Self-pay | Admitting: Student

## 2019-01-10 NOTE — Telephone Encounter (Signed)
Pt called stating she was with her friend during the weekend and has found out her friend's roommate tested positive for COVID-19. Pt states her friend has gone to get tested and has not received results yet. Pt is seeking advice as to what to do.  Pt was advised to wait until her friend gets her results:  If negative - no testing is required for pt and she is to go get fasting lab work at [facility] - lab  If positive - pt is to call the office to notify and to arrange scheduling for testing, pt is to self quarantine until she gets her negative result.  Pt was advised to wear a mask at all times when out in public and out of her house. Pt verbalized understanding and will call the office if her friend's test is positive.

## 2019-01-12 ENCOUNTER — Other Ambulatory Visit: Payer: Self-pay

## 2019-01-12 DIAGNOSIS — Z20822 Contact with and (suspected) exposure to covid-19: Secondary | ICD-10-CM

## 2019-01-15 LAB — NOVEL CORONAVIRUS, NAA: SARS-CoV-2, NAA: NOT DETECTED

## 2019-02-03 ENCOUNTER — Emergency Department (HOSPITAL_COMMUNITY)
Admission: EM | Admit: 2019-02-03 | Discharge: 2019-02-03 | Disposition: A | Discharge status: Home / Self Care | Payer: Self-pay | Attending: Emergency Medicine | Admitting: Emergency Medicine

## 2019-02-03 ENCOUNTER — Other Ambulatory Visit: Payer: Self-pay

## 2019-02-03 ENCOUNTER — Emergency Department (HOSPITAL_COMMUNITY): Payer: Self-pay

## 2019-02-03 ENCOUNTER — Encounter (HOSPITAL_COMMUNITY): Payer: Self-pay | Admitting: Emergency Medicine

## 2019-02-03 DIAGNOSIS — A09 Infectious gastroenteritis and colitis, unspecified: Secondary | ICD-10-CM | POA: Insufficient documentation

## 2019-02-03 DIAGNOSIS — J45909 Unspecified asthma, uncomplicated: Secondary | ICD-10-CM | POA: Insufficient documentation

## 2019-02-03 DIAGNOSIS — F17210 Nicotine dependence, cigarettes, uncomplicated: Secondary | ICD-10-CM | POA: Insufficient documentation

## 2019-02-03 LAB — URINALYSIS, ROUTINE W REFLEX MICROSCOPIC
Bilirubin Urine: NEGATIVE
Glucose, UA: NEGATIVE mg/dL
Ketones, ur: NEGATIVE mg/dL
Leukocytes,Ua: NEGATIVE
Nitrite: NEGATIVE
Protein, ur: NEGATIVE mg/dL
Specific Gravity, Urine: 1.026 (ref 1.005–1.030)
pH: 5 (ref 5.0–8.0)

## 2019-02-03 LAB — COMPREHENSIVE METABOLIC PANEL
ALT: 26 U/L (ref 0–44)
AST: 19 U/L (ref 15–41)
Albumin: 3.7 g/dL (ref 3.5–5.0)
Alkaline Phosphatase: 83 U/L (ref 38–126)
Anion gap: 8 (ref 5–15)
BUN: 10 mg/dL (ref 6–20)
CO2: 22 mmol/L (ref 22–32)
Calcium: 9.6 mg/dL (ref 8.9–10.3)
Chloride: 107 mmol/L (ref 98–111)
Creatinine, Ser: 0.74 mg/dL (ref 0.44–1.00)
GFR calc Af Amer: >60 mL/min (ref >60)
GFR calc non Af Amer: >60 mL/min (ref >60)
Glucose, Bld: 101 mg/dL — ABNORMAL HIGH (ref 70–99)
Potassium: 3.8 mmol/L (ref 3.5–5.1)
Sodium: 137 mmol/L (ref 135–145)
Total Bilirubin: 0.3 mg/dL (ref 0.3–1.2)
Total Protein: 7.2 g/dL (ref 6.5–8.1)

## 2019-02-03 LAB — CBC
HCT: 42.2 % (ref 36.0–46.0)
Hemoglobin: 14.1 g/dL (ref 12.0–15.0)
MCH: 31.5 pg (ref 26.0–34.0)
MCHC: 33.4 g/dL (ref 30.0–36.0)
MCV: 94.2 fL (ref 80.0–100.0)
Platelets: 265 10*3/uL (ref 150–400)
RBC: 4.48 MIL/uL (ref 3.87–5.11)
RDW: 12.4 % (ref 11.5–15.5)
WBC: 12.3 10*3/uL — ABNORMAL HIGH (ref 4.0–10.5)
nRBC: 0 % (ref 0.0–0.2)

## 2019-02-03 LAB — LIPASE, BLOOD: Lipase: 27 U/L (ref 11–51)

## 2019-02-03 LAB — HCG, SERUM, QUALITATIVE: Preg, Serum: NEGATIVE

## 2019-02-03 MED ORDER — KETOROLAC TROMETHAMINE 30 MG/ML IJ SOLN
30.0000 mg | Freq: Once | INTRAMUSCULAR | Status: AC
  Administered 2019-02-03: 30 mg via INTRAVENOUS
  Filled 2019-02-03: qty 1

## 2019-02-03 MED ORDER — PANTOPRAZOLE SODIUM 40 MG IV SOLR
40.0000 mg | Freq: Once | INTRAVENOUS | Status: AC
  Administered 2019-02-03: 40 mg via INTRAVENOUS
  Filled 2019-02-03: qty 40

## 2019-02-03 MED ORDER — DICYCLOMINE HCL 20 MG PO TABS: ORAL_TABLET | ORAL | 0 refills | Status: DC

## 2019-02-03 MED ORDER — SODIUM CHLORIDE 0.9 % IV BOLUS
1000.0000 mL | Freq: Once | INTRAVENOUS | Status: AC
  Administered 2019-02-03: 09:00:00 1000 mL via INTRAVENOUS

## 2019-02-03 MED ORDER — ONDANSETRON 4 MG PO TBDP: ORAL_TABLET | ORAL | 0 refills | Status: DC

## 2019-02-03 MED ORDER — ONDANSETRON HCL 4 MG/2ML IJ SOLN
4.0000 mg | Freq: Once | INTRAMUSCULAR | Status: AC
  Administered 2019-02-03: 4 mg via INTRAVENOUS
  Filled 2019-02-03: qty 2

## 2019-02-03 NOTE — ED Notes (Addendum)
Attempted to swab pt for covid, pt pulled swab out of nare immediatley and states she does not want the test, Dr Roderic Palau notified

## 2019-02-03 NOTE — ED Provider Notes (Signed)
[facility] EMERGENCY DEPARTMENT Provider Note   CSN: [id number] Arrival date & time: 02/03/19  [id number]     History   Chief Complaint Chief Complaint  Patient presents with  . Abdominal Pain    HPI Wendy Singh is a 43 y.o. female.     Patient complains of diarrhea and abdominal cramping.  This been going on for couple days.  The history is provided by the patient.  Abdominal Pain Pain location:  Generalized Pain quality: aching   Pain radiates to:  Does not radiate Pain severity:  Moderate Onset quality:  Sudden Timing:  Constant Progression:  Worsening Chronicity:  New Context: not alcohol use   Relieved by:  Nothing Worsened by:  Nothing Associated symptoms: diarrhea   Associated symptoms: no chest pain, no cough, no fatigue and no hematuria     Past Medical History:  Diagnosis Date  . Anemia   . Asthma   . Depression   . Kidney stones   . Migraines   . Ovarian cyst   . Vaginal Pap smear, abnormal     Patient Active Problem List   Diagnosis Date Noted  . 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 HSIL (high grade squamous intraepithelial lesion) on Pap smear of cervix 01/09/2014    Past Surgical History:  Procedure Laterality Date  . BREAST BIOPSY  05/2013  . CERVICAL BIOPSY  W/ LOOP ELECTRODE EXCISION    . CERVICAL CONIZATION W/BX N/A 09/26/2014   Procedure: CONIZATION CERVIX WITH BIOPSY;  Surgeon: Emily Filbert, MD;  Location: [place] [facility];  Service: Gynecology;  Laterality: N/A;  . LAPAROSCOPIC BILATERAL SALPINGECTOMY Bilateral 09/26/2014   Procedure: LAPAROSCOPIC BILATERAL SALPINGECTOMY;  Surgeon: Emily Filbert, MD;  Location: [place] [facility];  Service: Gynecology;  Laterality: Bilateral;     OB History    Gravida  3   Para  2   Term  2   Preterm      AB  1   Living  2     SAB  1   TAB      Ectopic      Multiple      Live Births  2            Home Medications    Prior to Admission medications   Medication Sig Start Date End Date  Taking? Authorizing Provider  albuterol (VENTOLIN HFA) 108 (90 Base) MCG/ACT inhaler Inhale 2 puffs into the lungs every 6 (six) hours as needed for wheezing or shortness of breath. 01/05/19   Soyla Dryer, PA-C  dicyclomine (BENTYL) 20 MG tablet Take one pill every 6-8 hours for abdominal cramps 02/03/19   Milton Ferguson, MD  ondansetron (ZOFRAN ODT) 4 MG disintegrating tablet 4mg  ODT q4 hours prn nausea/vomit 02/03/19   Milton Ferguson, MD    Family History Family History  Problem Relation Age of Onset  . Hypertension Father   . Heart disease Father   . Cancer Father        throat cancer  . Diabetes Brother   . Heart disease Mother   . Cancer Mother        ovarian cancer  . Hypertension Mother     Social History Social History   Tobacco Use  . Smoking status: Current Every Day Smoker    Packs/day: 0.50    Years: 17.00    Pack years: 8.50    Types: Cigarettes  . Smokeless tobacco: Never Used  Substance Use Topics  . Alcohol use: No  .  Drug use: No     Allergies   Penicillins   Review of Systems Review of Systems  Constitutional: Negative for appetite change and fatigue.  HENT: Negative for congestion, ear discharge and sinus pressure.   Eyes: Negative for discharge.  Respiratory: Negative for cough.   Cardiovascular: Negative for chest pain.  Gastrointestinal: Positive for abdominal pain and diarrhea.  Genitourinary: Negative for frequency and hematuria.  Musculoskeletal: Negative for back pain.  Skin: Negative for rash.  Neurological: Negative for seizures and headaches.  Psychiatric/Behavioral: Negative for hallucinations.     Physical Exam Updated Vital Signs BP 138/90 (BP Location: Right Arm)   Pulse 85   Temp 98.3 F (36.8 C) (Oral)   Resp 18   Ht 6' (1.829 m)   Wt 113.4 kg   LMP 01/20/2019 (Approximate)   SpO2 99%   BMI 33.91 kg/m   Physical Exam Vitals signs and nursing note reviewed.  Constitutional:      Appearance: She is  well-developed.  HENT:     Head: Normocephalic.     Nose: Nose normal.  Eyes:     General: No scleral icterus.    Conjunctiva/sclera: Conjunctivae normal.  Neck:     Musculoskeletal: Neck supple.     Thyroid: No thyromegaly.  Cardiovascular:     Rate and Rhythm: Normal rate and regular rhythm.     Heart sounds: No murmur. No friction rub. No gallop.   Pulmonary:     Breath sounds: No stridor. No wheezing or rales.  Chest:     Chest wall: No tenderness.  Abdominal:     General: There is no distension.     Tenderness: There is no abdominal tenderness. There is no rebound.  Musculoskeletal: Normal range of motion.  Lymphadenopathy:     Cervical: No cervical adenopathy.  Skin:    Findings: No erythema or rash.  Neurological:     Mental Status: She is oriented to person, place, and time.     Motor: No abnormal muscle tone.     Coordination: Coordination normal.  Psychiatric:        Behavior: Behavior normal.      ED Treatments / Results  Labs (all labs ordered are listed, but only abnormal results are displayed) Labs Reviewed  COMPREHENSIVE METABOLIC PANEL - Abnormal; Notable for the following components:      Result Value   Glucose, Bld 101 (*)    All other components within normal limits  CBC - Abnormal; Notable for the following components:   WBC 12.3 (*)    All other components within normal limits  URINALYSIS, ROUTINE W REFLEX MICROSCOPIC - Abnormal; Notable for the following components:   APPearance HAZY (*)    Hgb urine dipstick MODERATE (*)    Bacteria, UA RARE (*)    All other components within normal limits  LIPASE, BLOOD  HCG, SERUM, QUALITATIVE    EKG None  Radiology Dg Abd Acute 2+v W 1v Chest  Result Date: 02/03/2019 CLINICAL DATA:  Abdominal pain, fever EXAM: DG ABDOMEN ACUTE W/ 1V CHEST COMPARISON:  03/15/2018 FINDINGS: Heart is normal size.  Lungs clear.  No effusions. There is normal bowel gas pattern. No free air. No organomegaly or  suspicious calcification. No acute bony abnormality. IMPRESSION: Negative abdominal radiographs.  No acute cardiopulmonary disease. Electronically Signed   By: Rolm Baptise M.D.   On: 02/03/2019 10:17    Procedures Procedures (including critical care time)  Medications Ordered in ED Medications  sodium chloride 0.9 %  bolus 1,000 mL (1,000 mLs Intravenous New Bag/Given 02/03/19 0915)  ketorolac (TORADOL) 30 MG/ML injection 30 mg (30 mg Intravenous Given 02/03/19 0915)  ondansetron (ZOFRAN) injection 4 mg (4 mg Intravenous Given 02/03/19 0915)  pantoprazole (PROTONIX) injection 40 mg (40 mg Intravenous Given 02/03/19 0915)     Initial Impression / Assessment and Plan / ED Course  I have reviewed the triage vital signs and the nursing notes.  Pertinent labs & imaging results that were available during my care of the patient were reviewed by me and considered in my medical decision making (see chart for details).        Labs and x-rays unremarkable.  Patient improved with fluids and Toradol and nausea medicine.  Patient refused COVID test.  Suspect viral gastroenteritis.  She will be sent home with Bentyl and Zofran and follow-up as needed  Final Clinical Impressions(s) / ED Diagnoses   Final diagnoses:  Diarrhea of infectious origin    ED Discharge Orders         Ordered    dicyclomine (BENTYL) 20 MG tablet     02/03/19 1059    ondansetron (ZOFRAN ODT) 4 MG disintegrating tablet     02/03/19 1059           Milton Ferguson, MD 02/03/19 1106

## 2019-02-03 NOTE — ED Triage Notes (Signed)
Pt c/o sudden onset of sharp abdominal pain with n/v/d and fever that began yesterday afternoon. Pt states that her fever has been around 100.8. Pt last took Tylenol at 0400 this morning.

## 2019-02-03 NOTE — Discharge Instructions (Addendum)
Drink plenty of fluids and take Tylenol for fever and follow-up with your doctor if not improving

## 2019-03-28 ENCOUNTER — Encounter: Payer: Self-pay | Admitting: Physician Assistant

## 2019-03-28 ENCOUNTER — Ambulatory Visit: Payer: Self-pay | Admitting: Physician Assistant

## 2019-03-28 ENCOUNTER — Other Ambulatory Visit: Payer: Self-pay

## 2019-03-28 DIAGNOSIS — R0602 Shortness of breath: Secondary | ICD-10-CM

## 2019-03-28 DIAGNOSIS — Z20822 Contact with and (suspected) exposure to covid-19: Secondary | ICD-10-CM

## 2019-03-28 DIAGNOSIS — F17200 Nicotine dependence, unspecified, uncomplicated: Secondary | ICD-10-CM

## 2019-03-28 DIAGNOSIS — R05 Cough: Secondary | ICD-10-CM

## 2019-03-28 DIAGNOSIS — R059 Cough, unspecified: Secondary | ICD-10-CM

## 2019-03-28 MED ORDER — PREDNISONE 20 MG PO TABS: 20.0000 mg | ORAL_TABLET | Freq: Two times a day (BID) | ORAL | 0 refills | Status: DC

## 2019-03-28 NOTE — Progress Notes (Signed)
   There were no vitals taken for this visit.   Subjective:    Patient ID: Wendy Singh, female    DOB: 03-Jun-1976, 43 y.o.   MRN: [id number]  HPI: Wendy Singh is a 43 y.o. female presenting on 03/28/2019 for No chief complaint on file.   HPI   This is a telehealth appointment through [employer] due to coronavirus pandemic  I connected with  Wendy Singh on 03/28/19 by a video enabled telemedicine application and verified that I am speaking with the correct person using two identifiers.   I discussed the limitations of evaluation and management by telemedicine. The patient expressed understanding and agreed to proceed.  Pt is at work Training and development officer).  Provider is at office.      Pt says she has a Cough x 4 days.  She says her "lungs feel like they're on fire".  She has No fever.  She has some mild runny nose.  She works at [employer].  She denies known exposure to covid 19.      Relevant past medical, surgical, family and social history reviewed and updated as indicated. Interim medical history since our last visit reviewed. Allergies and medications reviewed and updated.   Current Outpatient Medications:  .  albuterol (VENTOLIN HFA) 108 (90 Base) MCG/ACT inhaler, Inhale 2 puffs into the lungs every 6 (six) hours as needed for wheezing or shortness of breath., Disp: 3 g, Rfl: 1   Review of Systems  Per HPI unless specifically indicated above     Objective:    There were no vitals taken for this visit.  Wt Readings from Last 3 Encounters:  02/03/19 250 lb (113.4 kg)  12/15/18 220 lb (99.8 kg)  12/13/18 209 lb 7 oz (95 kg)    Physical Exam Constitutional:      General: She is not in acute distress.    Appearance: She is not ill-appearing.  HENT:     Head: Normocephalic and atraumatic.  Pulmonary:     Effort: No respiratory distress.     Breath sounds: No wheezing.     Comments: No audible wheezes Neurological:     Mental Status: She is alert and oriented to  person, place, and time.  Psychiatric:        Attention and Perception: Attention normal.        Speech: Speech normal.        Behavior: Behavior is cooperative.         Assessment & Plan:   Encounter Diagnoses  Name Primary?  . Cough Yes  . SOB (shortness of breath)   . Tobacco use disorder   . Suspected 2019 novel coronavirus infection        -pt sent for covid test -rx Prednisone (to [facility] [address]).  She has her inhaler to use as needed.   -pt counseled to Quarantine until she gets results.  She is told that she should leave work NOW -Inhaler as needed -note to be out of work sent to her email per her request -encouraged pt to avoid smoking.   -pt to follow up as scheduled.  She is to contact office sooner prn worsening or new symptoms

## 2019-03-31 ENCOUNTER — Telehealth: Payer: Self-pay | Admitting: *Deleted

## 2019-03-31 ENCOUNTER — Telehealth: Payer: Self-pay

## 2019-03-31 NOTE — Telephone Encounter (Signed)
Pt returning call after receiving a voicemail to call back for results. Unable to locate results in the pt's chart at this time. [employer] called stated that there was a name discrepancy with the pt's test results but not able to determine the discrepancy. Spoke with Ivin Booty at [facility] who states that the results were "not detected". Results will also be faxed to [phone]. Pt called and informed that COVID-19 test results were negative.Understanding verbalized.

## 2019-04-01 ENCOUNTER — Telehealth: Payer: Self-pay

## 2019-04-01 NOTE — Telephone Encounter (Signed)
I called [employer] and spoke to [employer] about the name discrepancy on the specimen. He says to call back on Monday before 5 pm to speak to tech support about this issue and they will be able to fix it at that time.

## 2019-04-03 ENCOUNTER — Telehealth: Payer: Self-pay

## 2019-04-03 NOTE — Telephone Encounter (Signed)
Phone call to pt., advised that since the covid specimen vial was not labeled,we are unable to know with [id number] certainty that it was her specimen. For this reason it was recommended for pt. to retest. Pt verbalized understanding and agreed.

## 2019-04-03 NOTE — Telephone Encounter (Signed)
I called [employer] and spoke to [employer], Insurance claims handler in [employer] about the name discrepancy issue. She says the specimen was not labeled with the patient's information, so a form will need to be faxed and a doctor or NP will need to sign it verifying this is the correct patient, send it back to tech support, then the results will be released in the patient's chart. She says they were able to run the test because the requisition was correct and was in the specimen bag. I advised she will receive a call back with the fax number to send the form. Her direct number to call is [phone], Option 1, Ext [phone] or call the same number to Ext [phone] to speak to [facility]

## 2019-04-03 NOTE — Telephone Encounter (Signed)
Phone call to pt. To be retested for Covid. ( See TE of 10/19 @10 :46 am)

## 2019-04-03 NOTE — Telephone Encounter (Signed)
Late entry: received call on 10/16 from [facility], stating patient's specimen for covid test was not labeled.  [facility]  Advised to have Pt.  Retest. Attempted to call pt.  Left message on 10/16 for pt to return call regarding covid test .

## 2019-05-04 ENCOUNTER — Encounter: Payer: Self-pay | Admitting: Physician Assistant

## 2019-05-04 ENCOUNTER — Other Ambulatory Visit: Payer: Self-pay

## 2019-05-04 ENCOUNTER — Ambulatory Visit: Payer: Self-pay | Admitting: Physician Assistant

## 2019-05-04 DIAGNOSIS — Z20822 Contact with and (suspected) exposure to covid-19: Secondary | ICD-10-CM

## 2019-05-04 DIAGNOSIS — R059 Cough, unspecified: Secondary | ICD-10-CM

## 2019-05-04 DIAGNOSIS — R05 Cough: Secondary | ICD-10-CM

## 2019-05-04 DIAGNOSIS — R52 Pain, unspecified: Secondary | ICD-10-CM

## 2019-05-04 DIAGNOSIS — H9209 Otalgia, unspecified ear: Secondary | ICD-10-CM

## 2019-05-04 DIAGNOSIS — J029 Acute pharyngitis, unspecified: Secondary | ICD-10-CM

## 2019-05-04 DIAGNOSIS — R509 Fever, unspecified: Secondary | ICD-10-CM

## 2019-05-04 NOTE — Progress Notes (Signed)
There were no vitals taken for this visit.   Subjective:    Patient ID: Wendy Singh, female    DOB: 1976/02/12, 43 y.o.   MRN: [id number]  HPI: Wendy Singh is a 43 y.o. female presenting on 05/04/2019 for No chief complaint on file.   HPI  This is a telemedicine appointment through [email or web address] due to coronavirus pandemic.  I connected with  Wendy Singh on 05/04/19 by a video enabled telemedicine application and verified that I am speaking with the correct person using two identifiers.   I discussed the limitations of evaluation and management by telemedicine. The patient expressed understanding and agreed to proceed.  Patient is at home.  Provider is at office.   Patient is a 43 year old female who works at [employer].  She says that yesterday she began having cough, sore throat, ear ache, and headache.  Despite her symptoms, patient went to work this morning anyway.  She says when she got to work her temperature was checked and it was 101.7 so she was sent home.  Patient says she feels a little bit short of breath and has been using her albuterol inhaler this morning which has been helping her.  She says that she took some Tylenol and ibuprofen and it has helped her fever.     Relevant past medical, surgical, family and social history reviewed and updated as indicated. Interim medical history since our last visit reviewed. Allergies and medications reviewed and updated.   Current Outpatient Medications:  .  albuterol (VENTOLIN HFA) 108 (90 Base) MCG/ACT inhaler, Inhale 2 puffs into the lungs every 6 (six) hours as needed for wheezing or shortness of breath., Disp: 3 g, Rfl: 1    Review of Systems  Per HPI unless specifically indicated above      Objective:    There were no vitals taken for this visit.  Wt Readings from Last 3 Encounters:  02/03/19 250 lb (113.4 kg)  12/15/18 220 lb (99.8 kg)  12/13/18 209 lb 7 oz (95 kg)    Physical  Exam Constitutional:      General: She is not in acute distress. HENT:     Head: Normocephalic and atraumatic.  Pulmonary:     Effort: No respiratory distress.  Neurological:     Mental Status: She is alert and oriented to person, place, and time.  Psychiatric:        Attention and Perception: Attention normal.        Speech: Speech normal.        Behavior: Behavior is cooperative.           Assessment & Plan:      Encounter Diagnoses  Name Primary?  . Suspected 2019 novel coronavirus infection Yes  . Cough   . Fever, unspecified fever cause   . Body aches   . Sore throat   . Earache     Discussed with patient that she is suspected to have COVID-19.  She is to go for testing today.  Patient is counseled to self isolate.  Discussed with patient that since she is sick, she will need to stay isolated until she is improved her symptoms regardless of the results of her Covid test.  Patient is counseled to rest, drink plenty of fluids, Tylenol or ibuprofen as needed, use her inhaler as needed.  Discussed with her reasons why she would need to seek care in the emergency room including problems with breathing or chest pains.  Patient will be emailed  a note to return to work on November 29.  Discussed with patient that she needs to stay self isolated until that time and she will need to be improved and fever free in order to return to work on November 29.  Patient states understanding.  Patient is counseled to call office if needed

## 2019-05-07 LAB — NOVEL CORONAVIRUS, NAA: SARS-CoV-2, NAA: NOT DETECTED

## 2019-05-08 ENCOUNTER — Telehealth: Payer: Self-pay | Admitting: Student

## 2019-05-08 NOTE — Telephone Encounter (Signed)
Pt states she is still coughing and cannot catch her breath while coughing. Pt states she feels like her lungs are burning when she coughs and is wheezing. Pt states she has used cough syrup and mucinex and her proventil inhaler every 45 minutes but does not help.  PA advised for pt to seek ER treatment. LPN notified pt and pt verbalized understanding.

## 2019-05-23 ENCOUNTER — Emergency Department (HOSPITAL_COMMUNITY): Payer: Self-pay

## 2019-05-23 ENCOUNTER — Other Ambulatory Visit: Payer: Self-pay

## 2019-05-23 ENCOUNTER — Encounter (HOSPITAL_COMMUNITY): Payer: Self-pay | Admitting: *Deleted

## 2019-05-23 ENCOUNTER — Emergency Department (HOSPITAL_COMMUNITY)
Admission: EM | Admit: 2019-05-23 | Discharge: 2019-05-23 | Disposition: A | Discharge status: Home / Self Care | Payer: Self-pay | Attending: Emergency Medicine | Admitting: Emergency Medicine

## 2019-05-23 DIAGNOSIS — M5126 Other intervertebral disc displacement, lumbar region: Secondary | ICD-10-CM | POA: Insufficient documentation

## 2019-05-23 DIAGNOSIS — J45909 Unspecified asthma, uncomplicated: Secondary | ICD-10-CM | POA: Insufficient documentation

## 2019-05-23 DIAGNOSIS — F17210 Nicotine dependence, cigarettes, uncomplicated: Secondary | ICD-10-CM | POA: Insufficient documentation

## 2019-05-23 LAB — CBC WITH DIFFERENTIAL/PLATELET
Abs Immature Granulocytes: 0.03 10*3/uL (ref 0.00–0.07)
Basophils Absolute: 0.1 10*3/uL (ref 0.0–0.1)
Basophils Relative: 1 %
Eosinophils Absolute: 0.2 10*3/uL (ref 0.0–0.5)
Eosinophils Relative: 3 %
HCT: 41.9 % (ref 36.0–46.0)
Hemoglobin: 13.5 g/dL (ref 12.0–15.0)
Immature Granulocytes: 0 %
Lymphocytes Relative: 33 %
Lymphs Abs: 2.9 10*3/uL (ref 0.7–4.0)
MCH: 31 pg (ref 26.0–34.0)
MCHC: 32.2 g/dL (ref 30.0–36.0)
MCV: 96.3 fL (ref 80.0–100.0)
Monocytes Absolute: 0.7 10*3/uL (ref 0.1–1.0)
Monocytes Relative: 8 %
Neutro Abs: 4.8 10*3/uL (ref 1.7–7.7)
Neutrophils Relative %: 55 %
Platelets: 239 10*3/uL (ref 150–400)
RBC: 4.35 MIL/uL (ref 3.87–5.11)
RDW: 12.2 % (ref 11.5–15.5)
WBC: 8.7 10*3/uL (ref 4.0–10.5)
nRBC: 0 % (ref 0.0–0.2)

## 2019-05-23 LAB — URINALYSIS, ROUTINE W REFLEX MICROSCOPIC
Bacteria, UA: NONE SEEN
Bilirubin Urine: NEGATIVE
Glucose, UA: NEGATIVE mg/dL
Ketones, ur: NEGATIVE mg/dL
Leukocytes,Ua: NEGATIVE
Nitrite: NEGATIVE
Protein, ur: NEGATIVE mg/dL
Specific Gravity, Urine: 1.017 (ref 1.005–1.030)
pH: 5 (ref 5.0–8.0)

## 2019-05-23 LAB — COMPREHENSIVE METABOLIC PANEL
ALT: 19 U/L (ref 0–44)
AST: 15 U/L (ref 15–41)
Albumin: 3.9 g/dL (ref 3.5–5.0)
Alkaline Phosphatase: 74 U/L (ref 38–126)
Anion gap: 10 (ref 5–15)
BUN: 12 mg/dL (ref 6–20)
CO2: 22 mmol/L (ref 22–32)
Calcium: 9.6 mg/dL (ref 8.9–10.3)
Chloride: 107 mmol/L (ref 98–111)
Creatinine, Ser: 0.93 mg/dL (ref 0.44–1.00)
GFR calc Af Amer: >60 mL/min (ref >60)
GFR calc non Af Amer: >60 mL/min (ref >60)
Glucose, Bld: 92 mg/dL (ref 70–99)
Potassium: 4 mmol/L (ref 3.5–5.1)
Sodium: 139 mmol/L (ref 135–145)
Total Bilirubin: 0.5 mg/dL (ref 0.3–1.2)
Total Protein: 7.1 g/dL (ref 6.5–8.1)

## 2019-05-23 LAB — LIPASE, BLOOD: Lipase: 21 U/L (ref 11–51)

## 2019-05-23 MED ORDER — CYCLOBENZAPRINE HCL 10 MG PO TABS: 10.0000 mg | ORAL_TABLET | Freq: Two times a day (BID) | ORAL | 0 refills | Status: DC | PRN

## 2019-05-23 MED ORDER — MORPHINE SULFATE (PF) 4 MG/ML IV SOLN
4.0000 mg | Freq: Once | INTRAVENOUS | Status: AC
  Administered 2019-05-23: 4 mg via INTRAVENOUS
  Filled 2019-05-23: qty 1

## 2019-05-23 MED ORDER — HYDROMORPHONE HCL 1 MG/ML IJ SOLN
1.0000 mg | Freq: Once | INTRAMUSCULAR | Status: AC
  Administered 2019-05-23: 1 mg via INTRAMUSCULAR
  Filled 2019-05-23: qty 1

## 2019-05-23 MED ORDER — KETOROLAC TROMETHAMINE 30 MG/ML IJ SOLN
30.0000 mg | Freq: Once | INTRAMUSCULAR | Status: AC
  Administered 2019-05-23: 30 mg via INTRAVENOUS
  Filled 2019-05-23: qty 1

## 2019-05-23 MED ORDER — PREDNISONE 10 MG PO TABS: 20.0000 mg | ORAL_TABLET | Freq: Every day | ORAL | 0 refills | Status: DC

## 2019-05-23 MED ORDER — SODIUM CHLORIDE 0.9 % IV BOLUS
1000.0000 mL | Freq: Once | INTRAVENOUS | Status: AC
  Administered 2019-05-23: 1000 mL via INTRAVENOUS

## 2019-05-23 MED ORDER — ONDANSETRON HCL 4 MG/2ML IJ SOLN
4.0000 mg | Freq: Once | INTRAMUSCULAR | Status: AC
  Administered 2019-05-23: 4 mg via INTRAVENOUS
  Filled 2019-05-23: qty 2

## 2019-05-23 MED ORDER — OXYCODONE-ACETAMINOPHEN 5-325 MG PO TABS: 1.0000 | ORAL_TABLET | ORAL | 0 refills | Status: DC | PRN

## 2019-05-23 MED ORDER — IOHEXOL 300 MG/ML  SOLN
100.0000 mL | Freq: Once | INTRAMUSCULAR | Status: AC | PRN
  Administered 2019-05-23: 100 mL via INTRAVENOUS

## 2019-05-23 NOTE — ED Notes (Signed)
Pt crying. Pain mostly in lower back with radiation to lower abdomen

## 2019-05-23 NOTE — Discharge Instructions (Signed)
I spoke with neurosurgeon about your diagnosis.  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  Prescription for pain medicine, anti-inflammatory medicine, muscle relaxer.  Ice pack.  Rest.

## 2019-05-23 NOTE — ED Provider Notes (Signed)
[facility] EMERGENCY DEPARTMENT Provider Note   CSN: [id number] Arrival date & time: 05/23/19  [id number]     History   Chief Complaint Chief Complaint  Patient presents with  . Back Pain    HPI Wendy Singh is a 43 y.o. female.     Level 5 caveat for acuity of condition.  Chief complaint severe bilateral low back pain with radiation to the bilateral lower abdomen.  No dysuria, hematuria, fever, sweats, chills, vomiting, diarrhea.  No previous history of kidney stones.  Severity is moderate to severe.  Palpation makes pain worse     Past Medical History:  Diagnosis Date  . Anemia   . Asthma   . Depression   . Kidney stones   . Migraines   . Ovarian cyst   . Vaginal Pap smear, abnormal     Patient Active Problem List   Diagnosis Date Noted  . 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 HSIL (high grade squamous intraepithelial lesion) on Pap smear of cervix 01/09/2014    Past Surgical History:  Procedure Laterality Date  . BREAST BIOPSY  05/2013  . CERVICAL BIOPSY  W/ LOOP ELECTRODE EXCISION    . CERVICAL CONIZATION W/BX N/A 09/26/2014   Procedure: CONIZATION CERVIX WITH BIOPSY;  Surgeon: Emily Filbert, MD;  Location: [facility];  Service: Gynecology;  Laterality: N/A;  . LAPAROSCOPIC BILATERAL SALPINGECTOMY Bilateral 09/26/2014   Procedure: LAPAROSCOPIC BILATERAL SALPINGECTOMY;  Surgeon: Emily Filbert, MD;  Location: [facility];  Service: Gynecology;  Laterality: Bilateral;     OB History    Gravida  3   Para  2   Term  2   Preterm      AB  1   Living  2     SAB  1   TAB      Ectopic      Multiple      Live Births  2            Home Medications    Prior to Admission medications   Medication Sig Start Date End Date Taking? Authorizing Provider  albuterol (VENTOLIN HFA) 108 (90 Base) MCG/ACT inhaler Inhale 2 puffs into the lungs every 6 (six) hours as needed for wheezing or shortness of breath. 01/05/19   Soyla Dryer, PA-C  cyclobenzaprine  (FLEXERIL) 10 MG tablet Take 1 tablet (10 mg total) by mouth 2 (two) times daily as needed for muscle spasms. 05/23/19   Nat Christen, MD  oxyCODONE-acetaminophen (PERCOCET) 5-325 MG tablet Take 1 tablet by mouth every 4 (four) hours as needed. 05/23/19   Nat Christen, MD  predniSONE (DELTASONE) 10 MG tablet Take 2 tablets (20 mg total) by mouth daily. 05/23/19   Nat Christen, MD    Family History Family History  Problem Relation Age of Onset  . Hypertension Father   . Heart disease Father   . Cancer Father        throat cancer  . Diabetes Brother   . Heart disease Mother   . Cancer Mother        ovarian cancer  . Hypertension Mother     Social History Social History   Tobacco Use  . Smoking status: Current Every Day Smoker    Packs/day: 0.50    Years: 17.00    Pack years: 8.50    Types: Cigarettes  . Smokeless tobacco: Never Used  Substance Use Topics  . Alcohol use: No  . Drug use: No  Allergies   Penicillins   Review of Systems Review of Systems  Unable to perform ROS: Acuity of condition     Physical Exam Updated Vital Signs BP 126/74   Pulse 65   Temp 98.1 F (36.7 C) (Oral)   Resp 20   LMP 05/16/2019   SpO2 99%   Physical Exam Vitals signs and nursing note reviewed.  Constitutional:      Appearance: She is well-developed.  HENT:     Head: Normocephalic and atraumatic.  Eyes:     Conjunctiva/sclera: Conjunctivae normal.  Neck:     Musculoskeletal: Neck supple.  Cardiovascular:     Rate and Rhythm: Normal rate and regular rhythm.  Pulmonary:     Effort: Pulmonary effort is normal.     Breath sounds: Normal breath sounds.  Abdominal:     General: Bowel sounds are normal.     Palpations: Abdomen is soft.  Musculoskeletal:     Comments: Muscular tenderness in the bilateral lower back  Skin:    General: Skin is warm and dry.  Neurological:     General: No focal deficit present.     Mental Status: She is alert and oriented to person, place,  and time.  Psychiatric:        Behavior: Behavior normal.      ED Treatments / Results  Labs (all labs ordered are listed, but only abnormal results are displayed) Labs Reviewed  URINALYSIS, ROUTINE W REFLEX MICROSCOPIC - Abnormal; Notable for the following components:      Result Value   APPearance HAZY (*)    Hgb urine dipstick SMALL (*)    All other components within normal limits  CBC WITH DIFFERENTIAL/PLATELET  COMPREHENSIVE METABOLIC PANEL  LIPASE, BLOOD    EKG None  Radiology Dg Lumbar Spine Complete  Result Date: 05/23/2019 CLINICAL DATA:  Low back pain and muscle spasms. EXAM: LUMBAR SPINE - COMPLETE 4+ VIEW COMPARISON:  02/20/2014 FINDINGS: There is no fracture or disc space narrowing. Lateral alignment is normal. Slight bilateral facet arthritis at L5-S1, unchanged. Slight chronic sclerosis at the iliac side of the right SI joint. IMPRESSION: No acute abnormality. Slight bilateral facet arthritis at L5-S1. Electronically Signed   By: Lorriane Shire M.D.   On: 05/23/2019 09:35   Ct Abdomen Pelvis W Contrast  Result Date: 05/23/2019 CLINICAL DATA:  Unexplained persistent BILATERAL lower back pain and lower abdominal pain since last night EXAM: CT ABDOMEN AND PELVIS WITH CONTRAST TECHNIQUE: Multidetector CT imaging of the abdomen and pelvis was performed using the standard protocol following bolus administration of intravenous contrast. Sagittal and coronal MPR images reconstructed from axial data set. CONTRAST:  159mL OMNIPAQUE IOHEXOL 300 MG/ML SOLN IV. Dilute oral contrast was not administered. COMPARISON:  12/13/2018 FINDINGS: Lower chest: LEFT basilar subsegmental atelectasis and bibasilar peribronchial thickening Hepatobiliary: Tiny low-attenuation focus posterior RIGHT lobe liver stable 5 mm image 25. Gallbladder and liver otherwise normal appearance. Pancreas: Normal appearance Spleen: Normal appearance Adrenals/Urinary Tract: Low-attenuation LEFT adrenal nodule 18 x 12  mm, -10 HU attenuation on prior noncontrast study consistent with adrenal adenoma. Adrenal glands, kidneys, ureters, and bladder otherwise normal appearance Stomach/Bowel: Normal appendix. Stomach and bowel loops normal appearance. Vascular/Lymphatic: Aorta normal caliber. Vascular structures patent. Phleboliths in pelvis. No adenopathy. Reproductive: Small uterine nodules compatible with leiomyomata. Uterus and ovaries otherwise normal appearance Other: No free air or free fluid. No acute inflammatory process identified. Musculoskeletal: RIGHT iliac bone island stable. No acute osseous findings. Central disc herniation at L4-L5  indenting thecal sac, likely abutting the L5 roots, appears larger than on the previous exam. IMPRESSION: Small LEFT adrenal adenoma. No other acute intra-abdominal or intrapelvic abnormalities. Small uterine leiomyomata. 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 Electronically Signed   By: Lavonia Dana M.D.   On: 05/23/2019 12:48    Procedures Procedures (including critical care time)  Medications Ordered in ED Medications  HYDROmorphone (DILAUDID) injection 1 mg (1 mg Intramuscular Given 05/23/19 0856)  sodium chloride 0.9 % bolus 1,000 mL (0 mLs Intravenous Stopped 05/23/19 1310)  morphine 4 MG/ML injection 4 mg (4 mg Intravenous Given 05/23/19 1124)  ondansetron (ZOFRAN) injection 4 mg (4 mg Intravenous Given 05/23/19 1124)  ketorolac (TORADOL) 30 MG/ML injection 30 mg (30 mg Intravenous Given 05/23/19 1124)  iohexol (OMNIPAQUE) 300 MG/ML solution 100 mL (100 mLs Intravenous Contrast Given 05/23/19 1224)     Initial Impression / Assessment and Plan / ED Course  I have reviewed the triage vital signs and the nursing notes.  Pertinent labs & imaging results that were available during my care of the patient were reviewed by me and considered in my medical decision making (see chart for details).         Patient is crying out in severe pain.  CT abdomen pelvis reveals central disc herniation at L4-L5 with impingement of nerve root.  These findings were discussed with the neurosurgeon on-call Dr.Cabbell.  Discharge medication Percocet, prednisone, Flexeril 10 mg.  Out of work through 05/28/2019.  Final Clinical Impressions(s) / ED Diagnoses   Final diagnoses:  HNP (herniated nucleus pulposus), lumbar    ED Discharge Orders         Ordered    oxyCODONE-acetaminophen (PERCOCET) 5-325 MG tablet  Every 4 hours PRN     05/23/19 1421    predniSONE (DELTASONE) 10 MG tablet  Daily     05/23/19 1421    cyclobenzaprine (FLEXERIL) 10 MG tablet  2 times daily PRN     05/23/19 1421           Nat Christen, MD 05/23/19 1425

## 2019-05-23 NOTE — ED Triage Notes (Signed)
Pt states she started to have back pain last night when she went to bed and when she woke up this am the pain had worsened to the point of pain with walking

## 2019-05-23 NOTE — ED Notes (Signed)
Patient transported to CT 

## 2019-05-23 NOTE — ED Notes (Signed)
Pt crying with pain. Dr Lacinda Axon notified and will reassess

## 2019-06-20 ENCOUNTER — Ambulatory Visit: Payer: Self-pay | Admitting: Physician Assistant

## 2019-06-20 ENCOUNTER — Encounter: Payer: Self-pay | Admitting: Physician Assistant

## 2019-06-20 DIAGNOSIS — R109 Unspecified abdominal pain: Secondary | ICD-10-CM

## 2019-06-20 MED ORDER — SULFAMETHOXAZOLE-TRIMETHOPRIM 800-160 MG PO TABS: 1.0000 | ORAL_TABLET | Freq: Two times a day (BID) | ORAL | 0 refills | Status: DC

## 2019-06-20 NOTE — Progress Notes (Signed)
   There were no vitals taken for this visit.   Subjective:    Patient ID: Wendy Singh, female    DOB: 1975-10-01, 44 y.o.   MRN: [id number]  HPI: Wendy Singh is a 44 y.o. female presenting on 06/20/2019 for No chief complaint on file.   HPI  This is a telemedicine appointent trhough [employer] due to coronavirus pandemic  I connected with  Kaylor Donnally on 06/20/19 by a video enabled telemedicine application and verified that I am speaking with the correct person using two identifiers.   I discussed the limitations of evaluation and management by telemedicine. The patient expressed understanding and agreed to proceed.  Pt is at home.  Provider is at office     Pt states kidney pain.  It started yesterday.    She says this is different from back pain that she was seen in ER for on 12/8.  No dysuria.   No fever.   She says she has appt with back dr in february (she had appt already but had to r/s)     Relevant past medical, surgical, family and social history reviewed and updated as indicated. Interim medical history since our last visit reviewed. Allergies and medications reviewed and updated.   Current Outpatient Medications:  .  albuterol (VENTOLIN HFA) 108 (90 Base) MCG/ACT inhaler, Inhale 2 puffs into the lungs every 6 (six) hours as needed for wheezing or shortness of breath., Disp: 3 g, Rfl: 1     Review of Systems  Per HPI unless specifically indicated above     Objective:    There were no vitals taken for this visit.  Wt Readings from Last 3 Encounters:  02/03/19 250 lb (113.4 kg)  12/15/18 220 lb (99.8 kg)  12/13/18 209 lb 7 oz (95 kg)    Physical Exam Constitutional:      General: She is not in acute distress.    Appearance: Normal appearance. She is not ill-appearing.  HENT:     Head: Normocephalic and atraumatic.  Pulmonary:     Effort: Pulmonary effort is normal. No respiratory distress.  Musculoskeletal:     Cervical back: Normal range  of motion.     Thoracic back: Normal range of motion.     Lumbar back: Normal range of motion.  Neurological:     Mental Status: She is alert and oriented to person, place, and time.  Psychiatric:        Attention and Perception: Attention normal.        Speech: Speech normal.        Behavior: Behavior is cooperative.      Good ROm     Assessment & Plan:     Encounter Diagnosis  Name Primary?  . Flank pain Yes     rx Septra  Fluids Pt to contact office for new symptoms or if this persists

## 2019-08-22 ENCOUNTER — Ambulatory Visit: Payer: Self-pay | Attending: Internal Medicine

## 2019-08-22 ENCOUNTER — Ambulatory Visit: Payer: Self-pay | Admitting: Physician Assistant

## 2019-08-22 DIAGNOSIS — R0981 Nasal congestion: Secondary | ICD-10-CM

## 2019-08-22 DIAGNOSIS — R05 Cough: Secondary | ICD-10-CM

## 2019-08-22 DIAGNOSIS — R059 Cough, unspecified: Secondary | ICD-10-CM

## 2019-08-22 DIAGNOSIS — Z20822 Contact with and (suspected) exposure to covid-19: Secondary | ICD-10-CM

## 2019-08-22 NOTE — Progress Notes (Signed)
   There were no vitals taken for this visit.   Subjective:    Patient ID: Wendy Singh, female    DOB: [birth year], 44 y.o.   MRN: [id number]  HPI: Nikya Berganza is a 44 y.o. female presenting on 08/22/2019 for No chief complaint on file.   HPI  This is a telemedicine appointment through [employer] due to coronavirus pandemic.  I connected with  [name] on 08/22/19 by a video enabled telemedicine application and verified that I am speaking with the correct person using two identifiers.   I discussed the limitations of evaluation and management by telemedicine. The patient expressed understanding and agreed to proceed.  Pt is at home.  Provider is working from home office.    Pt reports that she has been Sick since Saturday night.  She reports being Stuffy and congested.  She also has a Cough that started Sunday.  She is currently working at [employer].      Relevant past medical, surgical, family and social history reviewed and updated as indicated. Interim medical history since our last visit reviewed. Allergies and medications reviewed and updated.   Current Outpatient Medications:  .  albuterol (VENTOLIN HFA) 108 (90 Base) MCG/ACT inhaler, Inhale 2 puffs into the lungs every 6 (six) hours as needed for wheezing or shortness of breath., Disp: 3 g, Rfl: 1   Review of Systems  Per HPI unless specifically indicated above     Objective:    There were no vitals taken for this visit.  Wt Readings from Last 3 Encounters:  02/03/19 250 lb (113.4 kg)  12/15/18 220 lb (99.8 kg)  12/13/18 209 lb 7 oz (95 kg)    Physical Exam Constitutional:      General: She is not in acute distress.    Appearance: She is ill-appearing. She is not toxic-appearing.  HENT:     Head: Normocephalic and atraumatic.     Nose: Congestion and rhinorrhea present.  Pulmonary:     Effort: Pulmonary effort is normal. No respiratory distress.  Neurological:     Mental  Status: She is alert and oriented to person, place, and time.  Psychiatric:        Attention and Perception: Attention normal.        Speech: Speech normal.        Behavior: Behavior is cooperative.           Assessment & Plan:   Encounter Diagnoses  Name Primary?  . Cough Yes  . Head congestion   . Suspected 2019 novel coronavirus infection       -pt is scheduled for covid test -she is counseled on Self-isolation until she gets her test results and is feeling well -recommended OTCs prn symptoms -encouraged pt to avoid smoking -Discussed covid vaccine next month and she is wanting vaccine but says she may try to get it at different location prior to that time.  She will be contacted about it and offered appointment by schedulers -pt is to contact office for any worsening or new symtpoms

## 2019-08-23 ENCOUNTER — Encounter: Payer: Self-pay | Admitting: Physician Assistant

## 2019-08-24 ENCOUNTER — Ambulatory Visit: Payer: HRSA Program | Attending: Internal Medicine

## 2019-08-24 ENCOUNTER — Other Ambulatory Visit: Payer: Self-pay

## 2019-08-24 DIAGNOSIS — Z20822 Contact with and (suspected) exposure to covid-19: Secondary | ICD-10-CM | POA: Insufficient documentation

## 2019-08-25 LAB — NOVEL CORONAVIRUS, NAA: SARS-CoV-2, NAA: NOT DETECTED

## 2019-09-25 IMAGING — CR PORTABLE CHEST - 1 VIEW
1 series · 1 of 1 positions shown · non-contrast
Comparison: Radiographs August 16, 2018.

CLINICAL DATA: Cough, fever.

EXAM:
PORTABLE CHEST 1 VIEW

[portable]
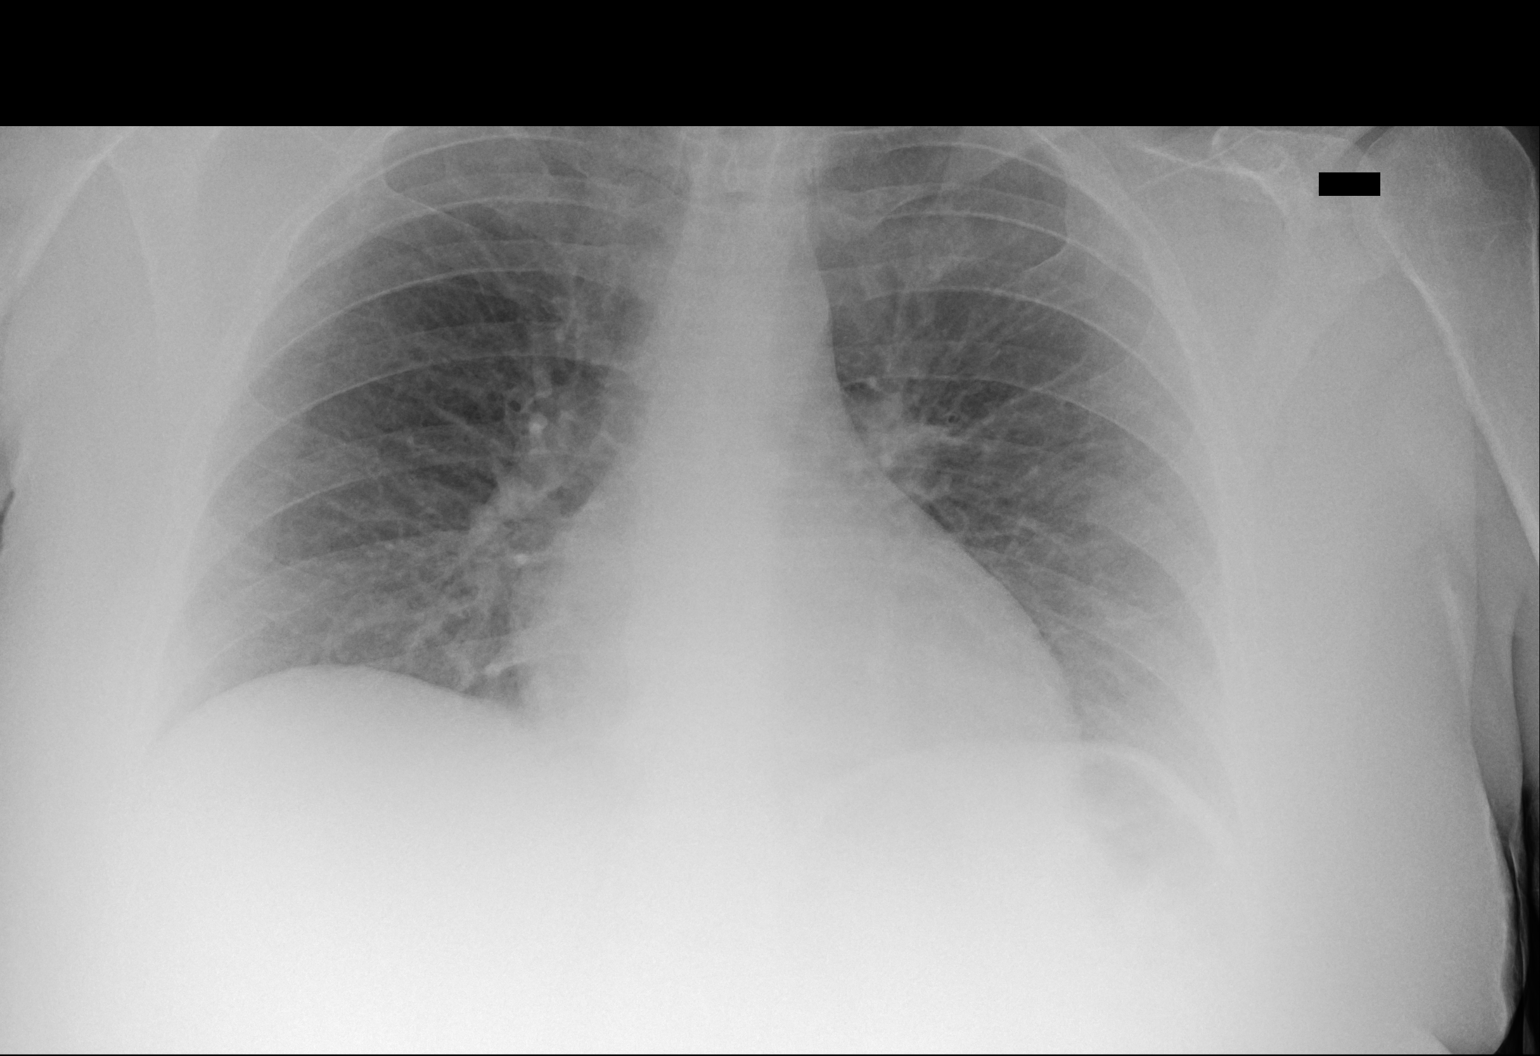

[1 of 1 positions shown; findings below may reference images not displayed]

FINDINGS: The heart size and mediastinal contours are within normal limits.
Both lungs are clear. The visualized skeletal structures are
unremarkable.
IMPRESSION: No active disease.

## 2019-10-17 ENCOUNTER — Other Ambulatory Visit: Payer: Self-pay | Admitting: Physician Assistant

## 2019-10-19 ENCOUNTER — Ambulatory Visit: Payer: HRSA Program | Attending: Internal Medicine

## 2019-10-19 ENCOUNTER — Telehealth: Payer: Self-pay | Admitting: Student

## 2019-10-19 ENCOUNTER — Other Ambulatory Visit: Payer: Self-pay

## 2019-10-19 DIAGNOSIS — Z20822 Contact with and (suspected) exposure to covid-19: Secondary | ICD-10-CM | POA: Insufficient documentation

## 2019-10-19 NOTE — Telephone Encounter (Signed)
Pt called 10-19-19 stating her friend got tested for COVID on 10-17-19 and tested positive. Pt states she was with her friend during the weekend of 4-31-21 through 10-15-19. Pt states she has been fully vaccinated against COVID and received her second dose on 10-06-19. Pt states she is having "allergy symptoms" which include runny nose, cough and itchy eyes.  PA recommends for pt to be tested for COVID.  LPN notified pt of PA's recommendations and pt was scheduled for testing for today 10-19-19 at 3:45PM. Pt is aware of appointment and is aware to quarantine. 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 [facility] will notify pt of her results once resulted. Pt verbalized understanding.

## 2019-10-20 LAB — NOVEL CORONAVIRUS, NAA: SARS-CoV-2, NAA: NOT DETECTED

## 2019-10-20 LAB — SARS-COV-2, NAA 2 DAY TAT

## 2019-10-23 ENCOUNTER — Telehealth: Payer: Self-pay | Admitting: Physician Assistant

## 2019-10-23 NOTE — Telephone Encounter (Signed)
Attempted to call pt to relay covid test results.  Got VM.  Left message for Wendy Singh to contact office

## 2019-10-24 ENCOUNTER — Telehealth: Payer: Self-pay | Admitting: Physician Assistant

## 2019-10-24 NOTE — Telephone Encounter (Signed)
Attempted to call pt to relay covid test results.  Got VM.  Left message for her to call clinic.

## 2019-10-24 NOTE — Telephone Encounter (Signed)
Pt returned call.  Reviewed negative covid test results.  All questions answered

## 2019-12-25 ENCOUNTER — Ambulatory Visit: Payer: Self-pay | Admitting: Physician Assistant

## 2020-02-22 ENCOUNTER — Other Ambulatory Visit: Payer: Self-pay

## 2020-05-02 ENCOUNTER — Emergency Department (HOSPITAL_COMMUNITY)
Admission: EM | Admit: 2020-05-02 | Discharge: 2020-05-02 | Disposition: A | Discharge status: Home / Self Care | Payer: Self-pay | Attending: Emergency Medicine | Admitting: Emergency Medicine

## 2020-05-02 ENCOUNTER — Emergency Department (HOSPITAL_COMMUNITY): Payer: Self-pay

## 2020-05-02 ENCOUNTER — Encounter (HOSPITAL_COMMUNITY): Payer: Self-pay | Admitting: *Deleted

## 2020-05-02 ENCOUNTER — Other Ambulatory Visit: Payer: Self-pay

## 2020-05-02 DIAGNOSIS — Z9889 Other specified postprocedural states: Secondary | ICD-10-CM | POA: Insufficient documentation

## 2020-05-02 DIAGNOSIS — R110 Nausea: Secondary | ICD-10-CM | POA: Insufficient documentation

## 2020-05-02 DIAGNOSIS — F17210 Nicotine dependence, cigarettes, uncomplicated: Secondary | ICD-10-CM | POA: Insufficient documentation

## 2020-05-02 DIAGNOSIS — J45909 Unspecified asthma, uncomplicated: Secondary | ICD-10-CM | POA: Insufficient documentation

## 2020-05-02 DIAGNOSIS — R1031 Right lower quadrant pain: Secondary | ICD-10-CM | POA: Insufficient documentation

## 2020-05-02 LAB — COMPREHENSIVE METABOLIC PANEL
ALT: 20 U/L (ref 0–44)
AST: 19 U/L (ref 15–41)
Albumin: 3.7 g/dL (ref 3.5–5.0)
Alkaline Phosphatase: 68 U/L (ref 38–126)
Anion gap: 7 (ref 5–15)
BUN: 17 mg/dL (ref 6–20)
CO2: 20 mmol/L — ABNORMAL LOW (ref 22–32)
Calcium: 10.1 mg/dL (ref 8.9–10.3)
Chloride: 107 mmol/L (ref 98–111)
Creatinine, Ser: 0.78 mg/dL (ref 0.44–1.00)
GFR, Estimated: >60 mL/min (ref >60)
Glucose, Bld: 112 mg/dL — ABNORMAL HIGH (ref 70–99)
Potassium: 4 mmol/L (ref 3.5–5.1)
Sodium: 134 mmol/L — ABNORMAL LOW (ref 135–145)
Total Bilirubin: 0.3 mg/dL (ref 0.3–1.2)
Total Protein: 6.8 g/dL (ref 6.5–8.1)

## 2020-05-02 LAB — CBC WITH DIFFERENTIAL/PLATELET
Abs Immature Granulocytes: 0.05 10*3/uL (ref 0.00–0.07)
Basophils Absolute: 0.1 10*3/uL (ref 0.0–0.1)
Basophils Relative: 0 %
Eosinophils Absolute: 0.3 10*3/uL (ref 0.0–0.5)
Eosinophils Relative: 2 %
HCT: 40.2 % (ref 36.0–46.0)
Hemoglobin: 13.7 g/dL (ref 12.0–15.0)
Immature Granulocytes: 0 %
Lymphocytes Relative: 27 %
Lymphs Abs: 3.2 10*3/uL (ref 0.7–4.0)
MCH: 32.2 pg (ref 26.0–34.0)
MCHC: 34.1 g/dL (ref 30.0–36.0)
MCV: 94.6 fL (ref 80.0–100.0)
Monocytes Absolute: 1 10*3/uL (ref 0.1–1.0)
Monocytes Relative: 8 %
Neutro Abs: 7.2 10*3/uL (ref 1.7–7.7)
Neutrophils Relative %: 63 %
Platelets: 240 10*3/uL (ref 150–400)
RBC: 4.25 MIL/uL (ref 3.87–5.11)
RDW: 12.6 % (ref 11.5–15.5)
WBC: 11.7 10*3/uL — ABNORMAL HIGH (ref 4.0–10.5)
nRBC: 0 % (ref 0.0–0.2)

## 2020-05-02 LAB — URINALYSIS, ROUTINE W REFLEX MICROSCOPIC
Bacteria, UA: NONE SEEN
Bilirubin Urine: NEGATIVE
Glucose, UA: NEGATIVE mg/dL
Ketones, ur: NEGATIVE mg/dL
Leukocytes,Ua: NEGATIVE
Nitrite: NEGATIVE
Protein, ur: NEGATIVE mg/dL
Specific Gravity, Urine: 1.031 — ABNORMAL HIGH (ref 1.005–1.030)
pH: 5 (ref 5.0–8.0)

## 2020-05-02 LAB — LIPASE, BLOOD: Lipase: 44 U/L (ref 11–51)

## 2020-05-02 LAB — PREGNANCY, URINE: Preg Test, Ur: NEGATIVE

## 2020-05-02 MED ORDER — HYDROCODONE-ACETAMINOPHEN 5-325 MG PO TABS
ORAL_TABLET | ORAL | 0 refills | Status: DC
Start: 2020-05-02 — End: 2020-08-28

## 2020-05-02 MED ORDER — ONDANSETRON HCL 4 MG/2ML IJ SOLN
4.0000 mg | Freq: Once | INTRAMUSCULAR | Status: AC
  Administered 2020-05-02: 4 mg via INTRAVENOUS
  Filled 2020-05-02: qty 2

## 2020-05-02 MED ORDER — METOCLOPRAMIDE HCL 5 MG/ML IJ SOLN
10.0000 mg | Freq: Once | INTRAMUSCULAR | Status: AC
  Administered 2020-05-02: 10 mg via INTRAVENOUS
  Filled 2020-05-02: qty 2

## 2020-05-02 MED ORDER — MORPHINE SULFATE (PF) 4 MG/ML IV SOLN
3.0000 mg | Freq: Once | INTRAVENOUS | Status: AC
  Administered 2020-05-02: 3 mg via INTRAVENOUS
  Filled 2020-05-02: qty 1

## 2020-05-02 MED ORDER — IOHEXOL 300 MG/ML  SOLN
100.0000 mL | Freq: Once | INTRAMUSCULAR | Status: AC | PRN
  Administered 2020-05-02: 100 mL via INTRAVENOUS

## 2020-05-02 MED ORDER — KETOROLAC TROMETHAMINE 30 MG/ML IJ SOLN
30.0000 mg | Freq: Once | INTRAMUSCULAR | Status: AC
  Administered 2020-05-02: 30 mg via INTRAVENOUS
  Filled 2020-05-02: qty 1

## 2020-05-02 NOTE — ED Triage Notes (Signed)
RLQ pain since last night, hx of ovarian cysts not requiring surgery.  Had her fallopian tubes removed in the past.  denies any vaginal bleeding.

## 2020-05-02 NOTE — ED Notes (Signed)
Pt transported to CT ?

## 2020-05-02 NOTE — ED Notes (Signed)
Pt returned from CT °

## 2020-05-02 NOTE — Discharge Instructions (Signed)
The CT and ultrasound this evening were reassuring.  It is unclear the source of your pain.  You may try applying heat to the area.  Take ibuprofen 600 mg 3 times a day.  Follow-up with your primary doctor for recheck.  Return to the emergency department if you develop any worsening symptoms such as increasing pain, vomiting, or fever.

## 2020-05-02 NOTE — ED Provider Notes (Signed)
[facility] EMERGENCY DEPARTMENT Provider Note   CSN: [id number] Arrival date & time: 05/02/20  1618     History Chief Complaint  Patient presents with  . Abdominal Pain    Wendy Singh is a 44 y.o. female.  HPI      Wendy Singh is a 44 y.o. female with past medical history of ovarian cysts, kidney stones and asthma.  She has a surgical history that includes bilateral salpingectomy.  she presents to the Emergency Department complaining of sudden onset of right lower quadrant and groin pain that woke her from sleep early this morning.  She states the pain is gradually worsened throughout the day and worsens with walking.  Pain radiates into her groin and right upper thigh.  Pain is been associated with some nausea.  Describes pain is sharp and worse with movement.  She also notes having some foul-smelling urine and increased urinary frequency for several days.  Reports hx of ovarian cysts and similar pain.  No vaginal discharge, or abnormal vaginal bleeding. She denies fever or chills, vomiting, diarrhea, chest pain or shortness of breath.      Past Medical History:  Diagnosis Date  . Anemia   . Asthma   . Depression   . Kidney stones   . Migraines   . Ovarian cyst   . Vaginal Pap smear, abnormal     Patient Active Problem List   Diagnosis Date Noted  . 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 HSIL (high grade squamous intraepithelial lesion) on Pap smear of cervix 01/09/2014    Past Surgical History:  Procedure Laterality Date  . BREAST BIOPSY  05/2013  . CERVICAL BIOPSY  W/ LOOP ELECTRODE EXCISION    . CERVICAL CONIZATION W/BX N/A 09/26/2014   Procedure: CONIZATION CERVIX WITH BIOPSY;  Surgeon: Emily Filbert, MD;  Location: [facility];  Service: Gynecology;  Laterality: N/A;  . LAPAROSCOPIC BILATERAL SALPINGECTOMY Bilateral 09/26/2014   Procedure: LAPAROSCOPIC BILATERAL SALPINGECTOMY;  Surgeon: Emily Filbert, MD;  Location: [facility];  Service: Gynecology;   Laterality: Bilateral;     OB History    Gravida  3   Para  2   Term  2   Preterm      AB  1   Living  2     SAB  1   TAB      Ectopic      Multiple      Live Births  2           Family History  Problem Relation Age of Onset  . Hypertension Father   . Heart disease Father   . Cancer Father        throat cancer  . Diabetes Brother   . Heart disease Mother   . Cancer Mother        ovarian cancer  . Hypertension Mother     Social History   Tobacco Use  . Smoking status: Current Every Day Smoker    Packs/day: 0.50    Years: 17.00    Pack years: 8.50    Types: Cigarettes  . Smokeless tobacco: Never Used  Vaping Use  . Vaping Use: Never used  Substance Use Topics  . Alcohol use: No  . Drug use: No    Home Medications Prior to Admission medications   Medication Sig Start Date End Date Taking? Authorizing Provider  acetaminophen (TYLENOL) 500 MG tablet Take 1,000 mg by mouth every 6 (six) hours as needed for  moderate pain.   Yes [provider]  ibuprofen (ADVIL) 800 MG tablet Take 800 mg by mouth every 8 (eight) hours as needed for moderate pain.   Yes [provider]  PROVENTIL HFA 108 (90 Base) MCG/ACT inhaler INHALE 2 PUFFS BY MOUTH EVERY 6 HOURS AS NEEDED FOR WHEEZING OR SHORTNESS OF BREATH Patient taking differently: Inhale 2 puffs into the lungs every 6 (six) hours as needed.  10/17/19  Yes Soyla Dryer, PA-C    Allergies    Penicillins  Review of Systems   Review of Systems  Constitutional: Negative for chills, fatigue and fever.  Respiratory: Negative for shortness of breath.   Cardiovascular: Negative for chest pain and palpitations.  Gastrointestinal: Positive for nausea. Negative for abdominal pain and diarrhea.  Genitourinary: Positive for frequency. Negative for dysuria, flank pain, hematuria, menstrual problem, vaginal bleeding, vaginal discharge and vaginal pain.       Malodorous urine  Musculoskeletal:  Negative for arthralgias, back pain, myalgias, neck pain and neck stiffness.  Skin: Negative for rash.  Neurological: Negative for dizziness, weakness, numbness and headaches.  Hematological: Does not bruise/bleed easily.    Physical Exam Updated Vital Signs BP 127/66   Pulse 83   Temp 98.2 F (36.8 C) (Oral)   Resp (!) 21   Ht 5\' 11"  (1.803 m)   Wt 99.8 kg   LMP 04/10/2020   SpO2 98%   BMI 30.68 kg/m   Physical Exam Vitals and nursing note reviewed.  Constitutional:      General: She is not in acute distress.    Appearance: Normal appearance. She is well-developed. She is not ill-appearing or toxic-appearing.  HENT:     Mouth/Throat:     Mouth: Mucous membranes are moist.  Neck:     Thyroid: No thyromegaly.     Meningeal: Kernig's sign absent.  Cardiovascular:     Rate and Rhythm: Normal rate and regular rhythm.     Pulses: Normal pulses.  Pulmonary:     Effort: Pulmonary effort is normal.     Breath sounds: Normal breath sounds. No wheezing.  Abdominal:     Palpations: Abdomen is soft.     Tenderness: There is abdominal tenderness. There is no right CVA tenderness, left CVA tenderness, guarding or rebound.     Comments: ttp of the RLQ and right inguinal area.  No guarding or rebound tenderness. abd is soft.    Musculoskeletal:        General: Normal range of motion.     Cervical back: Normal range of motion and neck supple.     Right lower leg: No edema.     Left lower leg: No edema.  Skin:    General: Skin is warm.     Capillary Refill: Capillary refill takes less than 2 seconds.     Findings: No rash.  Neurological:     General: No focal deficit present.     Mental Status: She is alert.     Sensory: No sensory deficit.     Motor: No weakness.     ED Results / Procedures / Treatments   Labs (all labs ordered are listed, but only abnormal results are displayed) Labs Reviewed  URINALYSIS, ROUTINE W REFLEX MICROSCOPIC - Abnormal; Notable for the following  components:      Result Value   APPearance HAZY (*)    Specific Gravity, Urine 1.031 (*)    Hgb urine dipstick MODERATE (*)    All other components within normal  limits  COMPREHENSIVE METABOLIC PANEL - Abnormal; Notable for the following components:   Sodium 134 (*)    CO2 20 (*)    Glucose, Bld 112 (*)    All other components within normal limits  CBC WITH DIFFERENTIAL/PLATELET - Abnormal; Notable for the following components:   WBC 11.7 (*)    All other components within normal limits  LIPASE, BLOOD  PREGNANCY, URINE    EKG None  Radiology CT ABDOMEN PELVIS W CONTRAST  Result Date: 05/02/2020 CLINICAL DATA:  44 year old female with right lower quadrant abdominal pain. EXAM: CT ABDOMEN AND PELVIS WITH CONTRAST TECHNIQUE: Multidetector CT imaging of the abdomen and pelvis was performed using the standard protocol following bolus administration of intravenous contrast. CONTRAST:  183mL OMNIPAQUE IOHEXOL 300 MG/ML  SOLN COMPARISON:  CT abdomen pelvis dated 05/23/2019. FINDINGS: Lower chest: The visualized lung bases are clear. No intra-abdominal free air or free fluid. Hepatobiliary: Subcentimeter right hepatic hypodense focus is too small to characterize. There is mild fatty infiltration of the liver. No intrahepatic biliary dilatation. The gallbladder is unremarkable Pancreas: Unremarkable. No pancreatic ductal dilatation or surrounding inflammatory changes. Spleen: Normal in size without focal abnormality. Adrenals/Urinary Tract: The right adrenal gland is unremarkable. There is an indeterminate 2 cm left adrenal nodule. Punctate nonobstructing right renal interpolar calculus. No hydronephrosis. The left kidney is unremarkable. There is symmetric enhancement and excretion of contrast by both kidneys. The visualized ureters and urinary bladder appear unremarkable. Stomach/Bowel: There is no bowel obstruction or active inflammation. The appendix is normal. Vascular/Lymphatic: The abdominal  aorta and IVC unremarkable. No portal venous gas. There is no adenopathy. Reproductive: The uterus is anteverted. Multiple uterine fibroids noted. There is a 15 mm left ovarian corpus luteum and a similar sized right ovarian cyst. Other: None Musculoskeletal: No acute or significant osseous findings. IMPRESSION: 1. No acute intra-abdominal or pelvic pathology. No bowel obstruction. Normal appendix. 2. Punctate nonobstructing right renal interpolar calculus. No hydronephrosis. 3. Uterine fibroids. Electronically Signed   By: Anner Crete M.D.   On: 05/02/2020 19:46   US PELVIC COMPLETE WITH TRANSVAGINAL  Addendum Date: 05/02/2020   ADDENDUM REPORT: 05/02/2020 20:21 ADDENDUM: In addition to the initial findings, the case was re reviewed and discussed with the ordering provider Tameika Heckmann on 05/02/2020 at approximately 8:10 p.m. Although Doppler imaging was not performed, the ovaries themselves are normal in size and retain a normal morphology, with no heterogeneity, edematous change, or other secondary signs to suggest acute torsion. Additionally, no free fluid or other inflammatory changes are seen within the pelvis as might be expected with an underlying occult torsion. As a result, additional doppler imaging is felt to be of doubtful utility. However, if the patient's condition should persist and/or deteriorate or worsen, then repeat ultrasound including Doppler imaging would be recommended for further evaluation. Electronically Signed   By: Jeannine Boga M.D.   On: 05/02/2020 20:21   Result Date: 05/02/2020 CLINICAL DATA:  Initial evaluation for acute right lower quadrant pain. History of prior bilateral salpingectomies. EXAM: TRANSABDOMINAL AND TRANSVAGINAL ULTRASOUND OF PELVIS TECHNIQUE: Both transabdominal and transvaginal ultrasound examinations of the pelvis were performed. Transabdominal technique was performed for global imaging of the pelvis including uterus, ovaries, adnexal  regions, and pelvic cul-de-sac. It was necessary to proceed with endovaginal exam following the transabdominal exam to visualize the uterus, endometrium, and ovaries. COMPARISON:  Prior CT from 05/23/2019 and ultrasound from 12/12/2017. FINDINGS: Uterus Measurements: 12.4 x 6.0 x 7.0 cm = volume: 275.3 mL. Uterus  is anteverted. 1.2 x 1.5 x 1.2 cm intramural fibroid present at the left uterine fundus. Endometrium Thickness: 15 mm.  No focal abnormality visualized. Right ovary Measurements: 2.6 x 1.7 x 1.9 cm = volume: 4.3 mL. Normal appearance/no adnexal mass. Left ovary Measurements: 1.8 x 1.1 x 1.3 cm = volume: 1.4 mL. Normal appearance/no adnexal mass. Other findings No abnormal free fluid. IMPRESSION: 1. 1.5 cm intramural fibroid at the left uterine fundus. 2. Otherwise unremarkable and normal pelvic ultrasound. No acute abnormality or findings to explain patient's symptoms identified. Electronically Signed: By: Jeannine Boga M.D. On: 05/02/2020 18:28    Procedures Procedures (including critical care time)  Medications Ordered in ED Medications  ketorolac (TORADOL) 30 MG/ML injection 30 mg (30 mg Intravenous Given 05/02/20 1749)  ondansetron (ZOFRAN) injection 4 mg (4 mg Intravenous Given 05/02/20 1749)  metoCLOPramide (REGLAN) injection 10 mg (10 mg Intravenous Given 05/02/20 1855)  morphine 4 MG/ML injection 3 mg (3 mg Intravenous Given 05/02/20 1857)  iohexol (OMNIPAQUE) 300 MG/ML solution 100 mL (100 mLs Intravenous Contrast Given 05/02/20 1934)    ED Course  I have reviewed the triage vital signs and the nursing notes.  Pertinent labs & imaging results that were available during my care of the patient were reviewed by me and considered in my medical decision making (see chart for details).    MDM Rules/Calculators/A&P                          Pt here with RLQ and right inguinal pain that radiates into her upper thigh.  Pain worsens with walking.  No fever, vomiting or chills.   Urinary frequency and malodorous urine reported as well.  Overall, she is well appearing, non toxic. Reports hx of frequent ovarian cysts with similar pain.  At this point, symptoms appear to be pelvic, but would certainly also consider acute appendicitis in the differential.  Labs w/o significant findings, urine w/o evidence of UTI, moderate hgb noted.  preg negative.  Will proceed with US pelvis.   Pelvic US w/o acute findings.  CT abdomen and pelvis ordered.  On recheck, patient reporting only minimal relief with Toradol.  IV morphine and Reglan ordered.  CT scan of abdomen and pelvis also without findings to explain patient's pain.  I consulted radiologist who reviewed imaging.  Although pelvic ultrasound ordered without Doppler, the ovaries appeared normal in size without edematous changes or other secondary findings that would suggest torsion and felt that additional imaging with doppler would be of low value.    I was notified by nursing staff that patient is wanting to be discharged stating that her daughter has to go to work and she is her ride home.  I have discussed importance of close follow-up and given strict ER return precautions.  Patient verbalized understanding agreed to plan.   Final Clinical Impression(s) / ED Diagnoses Final diagnoses:  RLQ abdominal pain    Rx / DC Orders ED Discharge Orders    None       Kem Parkinson, PA-C 05/03/20 0011    Merryl Hacker, MD 05/06/20 0236

## 2020-08-28 ENCOUNTER — Ambulatory Visit
Admission: EM | Admit: 2020-08-28 | Discharge: 2020-08-28 | Disposition: A | Discharge status: Home / Self Care | Payer: Self-pay | Attending: Family Medicine | Admitting: Family Medicine

## 2020-08-28 ENCOUNTER — Encounter: Payer: Self-pay | Admitting: Emergency Medicine

## 2020-08-28 DIAGNOSIS — H66002 Acute suppurative otitis media without spontaneous rupture of ear drum, left ear: Secondary | ICD-10-CM

## 2020-08-28 DIAGNOSIS — J069 Acute upper respiratory infection, unspecified: Secondary | ICD-10-CM

## 2020-08-28 HISTORY — DX: Essential (primary) hypertension: I10

## 2020-08-28 MED ORDER — CEFDINIR 300 MG PO CAPS: 300.0000 mg | ORAL_CAPSULE | Freq: Two times a day (BID) | ORAL | 0 refills | Status: DC

## 2020-08-28 NOTE — ED Provider Notes (Signed)
[place]   [id number] 08/28/20 Arrival Time: [place]:  1. Acute upper respiratory infection   2. Non-recurrent acute suppurative otitis media of left ear without spontaneous rupture of tympanic membrane     Discussed typical duration of viral URI. Likely developing L OM. Begin: Meds ordered this encounter  Medications  . cefdinir (OMNICEF) 300 MG capsule    Sig: Take 1 capsule (300 mg total) by mouth 2 (two) times daily.    Dispense:  14 capsule    Refill:  0   OTC analgesics as needed. Work note provided.    Follow-up Information    Soyla Dryer, PA-C.   Specialty: Physician Assistant Why: If worsening or failing to improve as anticipated. Contact information: [address] [place] [address] [phone]               Reviewed expectations re: course of current medical issues. Questions answered. Outlined signs and symptoms indicating need for more acute intervention. Understanding verbalized. After Visit Summary given.   SUBJECTIVE: History from: patient. Wendy Singh is a 45 y.o. female who reports nasal congestion, post-nasal drainage, ST, L otalgia; x sev days. Afebrile. No coughing.  Normal PO intake without n/v/d.    OBJECTIVE:  Vitals:   08/28/20 1546  BP: 108/71  Pulse: 88  Resp: 18  Temp: 98.6 F (37 C)  TempSrc: Oral  SpO2: 97%    General appearance: alert; no distress Eyes: PERRLA; EOMI; conjunctiva normal HENT: [place]; AT; with nasal congestion; L TM with dullness, bulging, mild erythema compared to R Neck: supple without LAD Lungs: speaks full sentences without difficulty; unlabored Extremities: no edema Skin: warm and dry Neurologic: normal gait Psychological: alert and cooperative; normal mood and affect   Allergies  Allergen Reactions  . Penicillins Rash    Has patient had a PCN reaction causing immediate rash, facial/tongue/throat swelling, SOB or lightheadedness with hypotension:  Yes Has patient had a PCN reaction causing severe rash involving mucus membranes or skin necrosis: Yes Has patient had a PCN reaction that required hospitalization: No Has patient had a PCN reaction occurring within the last 10 years: Yes If all of the above answers are "NO", then may proceed with Cephalosporin use.     Past Medical History:  Diagnosis Date  . Anemia   . Asthma   . Depression   . Hypertension   . Kidney stones   . Migraines   . Ovarian cyst   . Vaginal Pap smear, abnormal    Social History   Socioeconomic History  . Marital status: Single    Spouse name: Not on file  . Number of children: 2  . Years of education: Not on file  . Highest education level: Some college, no degree  Occupational History  . Not on file  Tobacco Use  . Smoking status: Current Every Day Smoker    Packs/day: 0.50    Years: 17.00    Pack years: 8.50    Types: Cigarettes  . Smokeless tobacco: Never Used  Vaping Use  . Vaping Use: Never used  Substance and Sexual Activity  . Alcohol use: No  . Drug use: No  . Sexual activity: Yes    Birth control/protection: None, Condom  Other Topics Concern  . Not on file  Social History Narrative  . Not on file   Social Determinants of Health   Financial Resource Strain: Not on file  Food Insecurity: Not on file  Transportation Needs: Not  on file  Physical Activity: Not on file  Stress: Not on file  Social Connections: Not on file  Intimate Partner Violence: Not on file   Family History  Problem Relation Age of Onset  . Hypertension Father   . Heart disease Father   . Cancer Father        throat cancer  . Diabetes Brother   . Heart disease Mother   . Cancer Mother        ovarian cancer  . Hypertension Mother    Past Surgical History:  Procedure Laterality Date  . BREAST BIOPSY  05/2013  . CERVICAL BIOPSY  W/ LOOP ELECTRODE EXCISION    . CERVICAL CONIZATION W/BX N/A 09/26/2014   Procedure: CONIZATION CERVIX WITH BIOPSY;   Surgeon: Emily Filbert, MD;  Location: [facility];  Service: Gynecology;  Laterality: N/A;  . LAPAROSCOPIC BILATERAL SALPINGECTOMY Bilateral 09/26/2014   Procedure: LAPAROSCOPIC BILATERAL SALPINGECTOMY;  Surgeon: Emily Filbert, MD;  Location: [facility];  Service: Gynecology;  Laterality: Bilateral;     Vanessa Kick, MD 08/28/20 1616

## 2020-08-28 NOTE — ED Triage Notes (Signed)
Sore throat, left ear pain and left sided nasal congestion.  Has taken 2 covid tests that were negative.

## 2020-09-30 ENCOUNTER — Emergency Department (HOSPITAL_COMMUNITY)
Admission: EM | Admit: 2020-09-30 | Discharge: 2020-10-01 | Disposition: A | Discharge status: Home / Self Care | Payer: Self-pay | Attending: Emergency Medicine | Admitting: Emergency Medicine

## 2020-09-30 ENCOUNTER — Encounter (HOSPITAL_COMMUNITY): Payer: Self-pay

## 2020-09-30 ENCOUNTER — Emergency Department (HOSPITAL_COMMUNITY): Payer: Self-pay

## 2020-09-30 ENCOUNTER — Other Ambulatory Visit: Payer: Self-pay

## 2020-09-30 DIAGNOSIS — N200 Calculus of kidney: Secondary | ICD-10-CM | POA: Insufficient documentation

## 2020-09-30 DIAGNOSIS — I10 Essential (primary) hypertension: Secondary | ICD-10-CM | POA: Insufficient documentation

## 2020-09-30 DIAGNOSIS — R6883 Chills (without fever): Secondary | ICD-10-CM | POA: Insufficient documentation

## 2020-09-30 DIAGNOSIS — F17210 Nicotine dependence, cigarettes, uncomplicated: Secondary | ICD-10-CM | POA: Insufficient documentation

## 2020-09-30 DIAGNOSIS — E278 Other specified disorders of adrenal gland: Secondary | ICD-10-CM | POA: Insufficient documentation

## 2020-09-30 DIAGNOSIS — R109 Unspecified abdominal pain: Secondary | ICD-10-CM

## 2020-09-30 DIAGNOSIS — J45909 Unspecified asthma, uncomplicated: Secondary | ICD-10-CM | POA: Insufficient documentation

## 2020-09-30 DIAGNOSIS — R8290 Unspecified abnormal findings in urine: Secondary | ICD-10-CM

## 2020-09-30 DIAGNOSIS — R10A0 Flank pain, unspecified side: Secondary | ICD-10-CM

## 2020-09-30 DIAGNOSIS — D259 Leiomyoma of uterus, unspecified: Secondary | ICD-10-CM | POA: Insufficient documentation

## 2020-09-30 DIAGNOSIS — R112 Nausea with vomiting, unspecified: Secondary | ICD-10-CM | POA: Insufficient documentation

## 2020-09-30 DIAGNOSIS — Z79899 Other long term (current) drug therapy: Secondary | ICD-10-CM | POA: Insufficient documentation

## 2020-09-30 LAB — CBC
HCT: 39.5 % (ref 36.0–46.0)
Hemoglobin: 13.4 g/dL (ref 12.0–15.0)
MCH: 32.1 pg (ref 26.0–34.0)
MCHC: 33.9 g/dL (ref 30.0–36.0)
MCV: 94.7 fL (ref 80.0–100.0)
Platelets: 166 10*3/uL (ref 150–400)
RBC: 4.17 MIL/uL (ref 3.87–5.11)
RDW: 12.7 % (ref 11.5–15.5)
WBC: 11.3 10*3/uL — ABNORMAL HIGH (ref 4.0–10.5)
nRBC: 0 % (ref 0.0–0.2)

## 2020-09-30 LAB — COMPREHENSIVE METABOLIC PANEL
ALT: 20 U/L (ref 0–44)
AST: 16 U/L (ref 15–41)
Albumin: 3.6 g/dL (ref 3.5–5.0)
Alkaline Phosphatase: 66 U/L (ref 38–126)
Anion gap: 8 (ref 5–15)
BUN: 10 mg/dL (ref 6–20)
CO2: 22 mmol/L (ref 22–32)
Calcium: 9.8 mg/dL (ref 8.9–10.3)
Chloride: 105 mmol/L (ref 98–111)
Creatinine, Ser: 0.78 mg/dL (ref 0.44–1.00)
GFR, Estimated: >60 mL/min (ref >60)
Glucose, Bld: 95 mg/dL (ref 70–99)
Potassium: 3.5 mmol/L (ref 3.5–5.1)
Sodium: 135 mmol/L (ref 135–145)
Total Bilirubin: 0.2 mg/dL — ABNORMAL LOW (ref 0.3–1.2)
Total Protein: 6.7 g/dL (ref 6.5–8.1)

## 2020-09-30 LAB — URINALYSIS, ROUTINE W REFLEX MICROSCOPIC
Bilirubin Urine: NEGATIVE
Glucose, UA: NEGATIVE mg/dL
Ketones, ur: NEGATIVE mg/dL
Leukocytes,Ua: NEGATIVE
Nitrite: NEGATIVE
Protein, ur: NEGATIVE mg/dL
Specific Gravity, Urine: 1.021 (ref 1.005–1.030)
pH: 6 (ref 5.0–8.0)

## 2020-09-30 LAB — PREGNANCY, URINE: Preg Test, Ur: NEGATIVE

## 2020-09-30 LAB — LIPASE, BLOOD: Lipase: 30 U/L (ref 11–51)

## 2020-09-30 MED ORDER — OXYCODONE-ACETAMINOPHEN 5-325 MG PO TABS
1.0000 | ORAL_TABLET | Freq: Once | ORAL | Status: AC
  Administered 2020-10-01: 1 via ORAL
  Filled 2020-09-30: qty 1

## 2020-09-30 MED ORDER — ONDANSETRON HCL 4 MG PO TABS: 4.0000 mg | ORAL_TABLET | Freq: Three times a day (TID) | ORAL | 0 refills | Status: DC | PRN

## 2020-09-30 MED ORDER — KETOROLAC TROMETHAMINE 30 MG/ML IJ SOLN
30.0000 mg | Freq: Once | INTRAMUSCULAR | Status: AC
  Administered 2020-10-01: 30 mg via INTRAVENOUS
  Filled 2020-09-30: qty 1

## 2020-09-30 MED ORDER — LACTATED RINGERS IV BOLUS
500.0000 mL | Freq: Once | INTRAVENOUS | Status: AC
  Administered 2020-09-30: 500 mL via INTRAVENOUS

## 2020-09-30 MED ORDER — ACETAMINOPHEN 325 MG PO TABS
650.0000 mg | ORAL_TABLET | Freq: Once | ORAL | Status: AC
  Administered 2020-10-01: 650 mg via ORAL
  Filled 2020-09-30: qty 2

## 2020-09-30 MED ORDER — FENTANYL CITRATE (PF) 100 MCG/2ML IJ SOLN
100.0000 ug | Freq: Once | INTRAMUSCULAR | Status: AC
  Administered 2020-09-30: 100 ug via INTRAVENOUS
  Filled 2020-09-30: qty 2

## 2020-09-30 MED ORDER — ONDANSETRON HCL 4 MG/2ML IJ SOLN
4.0000 mg | Freq: Once | INTRAMUSCULAR | Status: AC
  Administered 2020-09-30: 4 mg via INTRAVENOUS
  Filled 2020-09-30: qty 2

## 2020-09-30 MED ORDER — CEPHALEXIN 500 MG PO CAPS: 500.0000 mg | ORAL_CAPSULE | Freq: Two times a day (BID) | ORAL | 0 refills | Status: AC

## 2020-09-30 NOTE — ED Notes (Signed)
Patient transported to CT 

## 2020-09-30 NOTE — ED Triage Notes (Signed)
Pt to er, pt states that she is here for flank pain, states that she has a hx of kidney stones and this feels the same.  Pt states that her pain started yesterday, and it has gotten worse today, states that today she is having some pressure to urinate, but is only urinating a little bit.  States that the pain goes into her back.

## 2020-09-30 NOTE — ED Provider Notes (Signed)
[facility] EMERGENCY DEPARTMENT Provider Note   CSN: [id number] Arrival date & time: 09/30/20  1938     History Chief Complaint  Patient presents with  . Flank Pain    Wendy Singh is a 45 y.o. female presents to the ED for evaluation of left flank pain that began yesterday.  Constant, severe, radiating to the left lower abdomen.  Associated with nausea, vomiting, chills, urinary urgency.  Has taken over-the-counter NSAID without improvement.  Reports long history of kidney stones and kidney infections.  She 1 passed a 9 mm stone without any intervention.  She has never required kidney stone or ureteral stone removal.  She had one hospitalization for a bad kidney infection.  Denies dysuria, hematuria.  No abnormal vaginal bleeding or discharge or pelvic pain.  No modifying factors.  No chest pain, shortness of breath, extremity weakness, paresthesias or numbness, rash.  No trauma. No IVDU, saddle anesthesia, bladder or bowel control issues.   HPI     Past Medical History:  Diagnosis Date  . Anemia   . Asthma   . Depression   . Hypertension   . Kidney stones   . Migraines   . Ovarian cyst   . Vaginal Pap smear, abnormal     Patient Active Problem List   Diagnosis Date Noted  . 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 HSIL (high grade squamous intraepithelial lesion) on Pap smear of cervix 01/09/2014    Past Surgical History:  Procedure Laterality Date  . BREAST BIOPSY  05/2013  . CERVICAL BIOPSY  W/ LOOP ELECTRODE EXCISION    . CERVICAL CONIZATION W/BX N/A 09/26/2014   Procedure: CONIZATION CERVIX WITH BIOPSY;  Surgeon: Emily Filbert, MD;  Location: [facility];  Service: Gynecology;  Laterality: N/A;  . LAPAROSCOPIC BILATERAL SALPINGECTOMY Bilateral 09/26/2014   Procedure: LAPAROSCOPIC BILATERAL SALPINGECTOMY;  Surgeon: Emily Filbert, MD;  Location: [facility];  Service: Gynecology;  Laterality: Bilateral;     OB History    Gravida  3   Para  2   Term  2   Preterm       AB  1   Living  2     SAB  1   IAB      Ectopic      Multiple      Live Births  2           Family History  Problem Relation Age of Onset  . Hypertension Father   . Heart disease Father   . Cancer Father        throat cancer  . Diabetes Brother   . Heart disease Mother   . Cancer Mother        ovarian cancer  . Hypertension Mother     Social History   Tobacco Use  . Smoking status: Current Every Day Smoker    Packs/day: 0.50    Years: 17.00    Pack years: 8.50    Types: Cigarettes  . Smokeless tobacco: Never Used  Vaping Use  . Vaping Use: Never used  Substance Use Topics  . Alcohol use: No  . Drug use: No    Home Medications Prior to Admission medications   Medication Sig Start Date End Date Taking? Authorizing Provider  cephALEXin (KEFLEX) 500 MG capsule Take 1 capsule (500 mg total) by mouth 2 (two) times daily for 7 days. 09/30/20 10/07/20 Yes Carmon Sails J, PA-C  ondansetron (ZOFRAN) 4 MG tablet Take 1 tablet (4  mg total) by mouth every 8 (eight) hours as needed for nausea or vomiting. 09/30/20  Yes Kinnie Feil, PA-C  acetaminophen (TYLENOL) 500 MG tablet Take 1,000 mg by mouth every 6 (six) hours as needed for moderate pain.    [provider]  cefdinir (OMNICEF) 300 MG capsule Take 1 capsule (300 mg total) by mouth 2 (two) times daily. 08/28/20   Vanessa Kick, MD  ibuprofen (ADVIL) 800 MG tablet Take 800 mg by mouth every 8 (eight) hours as needed for moderate pain.    [provider]  PROVENTIL HFA 108 (90 Base) MCG/ACT inhaler INHALE 2 PUFFS BY MOUTH EVERY 6 HOURS AS NEEDED FOR WHEEZING OR SHORTNESS OF BREATH Patient taking differently: Inhale 2 puffs into the lungs every 6 (six) hours as needed.  10/17/19   Soyla Dryer, PA-C    Allergies    Penicillins  Review of Systems   Review of Systems  Constitutional: Positive for chills.  Gastrointestinal: Positive for abdominal pain, nausea and vomiting.   Genitourinary: Positive for difficulty urinating, flank pain and urgency.  All other systems reviewed and are negative.   Physical Exam Updated Vital Signs BP 127/80   Pulse 68   Temp 98.3 F (36.8 C) (Oral)   Resp 17   Ht 6' (1.829 m)   Wt 96.6 kg   SpO2 95%   BMI 28.89 kg/m   Physical Exam Vitals and nursing note reviewed.  Constitutional:      Appearance: She is well-developed.     Comments: Non toxic in NAD  HENT:     Head: Normocephalic and atraumatic.     Nose: Nose normal.  Eyes:     Conjunctiva/sclera: Conjunctivae normal.  Cardiovascular:     Rate and Rhythm: Normal rate and regular rhythm.     Pulses:          Radial pulses are 1+ on the right side and 1+ on the left side.       Dorsalis pedis pulses are 1+ on the right side and 1+ on the left side.  Pulmonary:     Effort: Pulmonary effort is normal.     Breath sounds: Normal breath sounds.  Abdominal:     General: Bowel sounds are normal.     Palpations: Abdomen is soft.     Tenderness: There is abdominal tenderness (CVAT, left mid/lower and suprapubic tenderness). There is left CVA tenderness.     Comments: No G/R/R. Negative Murphy's and McBurney's. Active BS to lower quadrants.   Musculoskeletal:        General: Normal range of motion.     Cervical back: Normal range of motion.  Skin:    General: Skin is warm and dry.     Capillary Refill: Capillary refill takes less than 2 seconds.     Comments: No rash over back/flank or abdomen   Neurological:     Mental Status: She is alert.     Comments: Sensation and strength intact in upper/lower extremities   Psychiatric:        Behavior: Behavior normal.     ED Results / Procedures / Treatments   Labs (all labs ordered are listed, but only abnormal results are displayed) Labs Reviewed  CBC - Abnormal; Notable for the following components:      Result Value   WBC 11.3 (*)    All other components within normal limits  URINALYSIS, ROUTINE W REFLEX  MICROSCOPIC - Abnormal; Notable for the following components:  APPearance HAZY (*)    Hgb urine dipstick MODERATE (*)    Bacteria, UA RARE (*)    All other components within normal limits  COMPREHENSIVE METABOLIC PANEL - Abnormal; Notable for the following components:   Total Bilirubin 0.2 (*)    All other components within normal limits  URINE CULTURE  PREGNANCY, URINE  LIPASE, BLOOD    EKG None  Radiology CT Renal Stone Study  Result Date: 09/30/2020 CLINICAL DATA:  Left-sided flank pain EXAM: CT ABDOMEN AND PELVIS WITHOUT CONTRAST TECHNIQUE: Multidetector CT imaging of the abdomen and pelvis was performed following the standard protocol without IV contrast. COMPARISON:  05/02/2020 FINDINGS: Lower chest: No acute abnormality. Hepatobiliary: No focal liver abnormality is seen. No gallstones, gallbladder wall thickening, or biliary dilatation. Pancreas: Unremarkable. No pancreatic ductal dilatation or surrounding inflammatory changes. Spleen: Normal in size without focal abnormality. Adrenals/Urinary Tract: Adrenal glands are well visualized bilaterally. Right adrenal gland is within normal limits. Left adrenal gland shows a 2.0 cm hypodense lesion similar to that seen on the prior exam likely representing a small adenoma. Kidneys show tiny nonobstructing midpole right renal stone. The ureters are within normal limits. Bladder is decompressed. Stomach/Bowel: Scattered diverticular change of the colon is noted without evidence of diverticulitis. The appendix is within normal limits. Small bowel and stomach are unremarkable. Vascular/Lymphatic: No significant vascular findings are present. No enlarged abdominal or pelvic lymph nodes. Reproductive: Uterus again demonstrates an exophytic fibroid stable in appearance from the prior exam. Other: No abdominal wall hernia or abnormality. No abdominopelvic ascites. Musculoskeletal: No acute or significant osseous findings. IMPRESSION: Stable punctate  right renal stone without obstructive changes. Stable uterine fibroids. 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 Electronically Signed   By: Inez Catalina M.D.   On: 09/30/2020 22:29    Procedures Procedures   Medications Ordered in ED Medications  ketorolac (TORADOL) 30 MG/ML injection 30 mg (has no administration in time range)  oxyCODONE-acetaminophen (PERCOCET/ROXICET) 5-325 MG per tablet 1 tablet (has no administration in time range)  acetaminophen (TYLENOL) tablet 650 mg (has no administration in time range)  fentaNYL (SUBLIMAZE) injection 100 mcg (100 mcg Intravenous Given 09/30/20 2127)  ondansetron (ZOFRAN) injection 4 mg (4 mg Intravenous Given 09/30/20 2124)  lactated ringers bolus 500 mL (500 mLs Intravenous New Bag/Given 09/30/20 2122)    ED Course  I have reviewed the triage vital signs and the nursing notes.  Pertinent labs & imaging results that were available during my care of the patient were reviewed by me and considered in my medical decision making (see chart for details).  Clinical Course as of 09/30/20 2313  Mon Sep 30, 2020  2206 WBC, UA: 6-10 [CG]  2206 Bacteria, UA(!): RARE [CG]  2206 Squamous Epithelial / LPF: 11-20 [CG]  2206 Ca Oxalate Crys, UA: PRESENT [CG]  2236 CT Renal Stone Study IMPRESSION: Stable punctate right renal stone without obstructive changes.  Stable uterine fibroids.  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 [CG]    Clinical Course User Index [CG] Arlean Hopping   MDM Rules/Calculators/A&P                          45 year old female presents to the ED for evaluation of left flank pain that radiates to the left lower quadrant associated with nausea, vomiting, urinary urgency, chills.  History of kidney and ureteral stones, pyelonephritis.  EMR, triage nurse notes reviewed  DDx-ureteral stone, pyelonephritis/UTI.  She has no overlying rash to suggest shingles.  No trauma and fracture,  retroperitoneal hematoma unlikely.  She has no bowel movement changes like diarrhea to raise suspicion for diverticulitis.  No distal neuro pulse deficits, tearing back pain, chest pain, shortness of breath and dissection, AAA is less likely as well.  No fever, saddle anesthesia, extremity weakness or numbness, bladder or bowel retention or incontinence and epidural abscess less likely as well.  Lab work, imaging ordered and personally visualized and interpreted by me.  Labs reveal-poor urine sample with 11-20 squamous epithelial cells, rare bacteria, 6-10 WBC but no leukocytes, nitrates.  Mucus, Kayexalate crystals present.  WBC 11.3.  Creatinine, LFTs, lipase and pregnancy test negative/normal.  Imaging reveals-stable uterine fibroids, stable right renal stone without obstructive changes, stable left adrenal lesion likely an adenoma but no left-sided findings to explain her acute pain.  Medicines ordered in the ED-fentanyl, Zofran, IV fluids.  Patient reevaluated.  Reports improvement in pain initially now coming back.  Repeat abdominal exam with improvement in tenderness however.  Given previous history of kidney stone/pyelonephritis, degree of pain and equivocal urinalysis will discharge with Keflex for possible pyelonephritis/UTI.  Maybe she already passed a stone and having ureteral colic.  ?Shingles.  Will recommend NSAIDs.  Urine culture pending.  Discussed return precautions.  Patient is comfortable with this plan. Final Clinical Impression(s) / ED Diagnoses Final diagnoses:  Flank pain  Abnormal urinalysis    Rx / DC Orders ED Discharge Orders         Ordered    ondansetron (ZOFRAN) 4 MG tablet  Every 8 hours PRN        09/30/20 2308    cephALEXin (KEFLEX) 500 MG capsule  2 times daily        09/30/20 2308           Kinnie Feil, PA-C 09/30/20 2313    Hayden Rasmussen, MD 10/01/20 1041

## 2020-09-30 NOTE — Discharge Instructions (Addendum)
You were seen in the emergency department for left-sided flank and abdominal pain, nausea, vomiting, urinary urgency  Lab work was overall normal.  Urinalysis was equivocal and not a clean catch sample.  CT scan showed a stable left adrenal gland adenoma and a small right stone in the kidney, stable fibroid in your uterus.  These findings have been noted in the past and are not new.  We discussed it is possible that you may have passed a kidney stone and we did not capture it on the scan.  It is possible you are having ureteral colic or spasms of the tube after passing a stone.  It is also possible that you have an early kidney infection that is causing him pain, nausea and vomiting.  Please take Keflex morning and night for 7 days.  Use ondansetron as needed for nausea.  Stay hydrated.  Monitor for any rash may look like shingles.  For pain and inflammation you can use a combination of ibuprofen and acetaminophen.  Take [phone] mg acetaminophen (tylenol) every 6 hours or 600 mg ibuprofen (advil, motrin) every 6 hours.  You can take these separately or combine them every 6 hours for maximum pain control. Do not exceed 4,000 mg acetaminophen or 2,400 mg ibuprofen in a 24 hour period.  Do not take ibuprofen containing products if you have history of kidney disease, ulcers, GI bleeding, severe acid reflux, or take a blood thinner.  Do not take acetaminophen if you have liver disease.   Return to the ED for worsening symptoms, fever, chest pain, shortness of breath, rash, inability to urinate, or any other new or worsening concerns

## 2020-10-02 LAB — URINE CULTURE

## 2020-10-07 ENCOUNTER — Telehealth: Payer: Self-pay

## 2020-10-07 NOTE — Telephone Encounter (Signed)
Wendy Singh called for follow up after recent [facility] emergency room visit to assess for any needs or resources.  No answer, left voicemail requesting return call.   [employer]

## 2020-10-24 ENCOUNTER — Telehealth: Payer: Self-pay

## 2020-10-24 NOTE — Telephone Encounter (Signed)
Second attempt call to follow up regarding Wendy Singh's recent ED visit to [facility]. No answer, left voicemail requesting a return call to inquire if [facility] could assist her with any resources or access to care. Will also mail letter out about [facility] program and [place] RN [facility] [employer].

## 2021-01-01 ENCOUNTER — Emergency Department (HOSPITAL_COMMUNITY)
Admission: EM | Admit: 2021-01-01 | Discharge: 2021-01-01 | Disposition: A | Discharge status: Home / Self Care | Payer: Self-pay | Attending: Emergency Medicine | Admitting: Emergency Medicine

## 2021-01-01 ENCOUNTER — Other Ambulatory Visit: Payer: Self-pay

## 2021-01-01 ENCOUNTER — Encounter (HOSPITAL_COMMUNITY): Payer: Self-pay | Admitting: *Deleted

## 2021-01-01 DIAGNOSIS — R197 Diarrhea, unspecified: Secondary | ICD-10-CM | POA: Insufficient documentation

## 2021-01-01 DIAGNOSIS — R112 Nausea with vomiting, unspecified: Secondary | ICD-10-CM | POA: Insufficient documentation

## 2021-01-01 DIAGNOSIS — R109 Unspecified abdominal pain: Secondary | ICD-10-CM | POA: Insufficient documentation

## 2021-01-01 DIAGNOSIS — Z5321 Procedure and treatment not carried out due to patient leaving prior to being seen by health care provider: Secondary | ICD-10-CM | POA: Insufficient documentation

## 2021-01-01 NOTE — ED Triage Notes (Signed)
Pt c/o sharp abdominal pain with n/v/d that started last night 30 mins after eating [employer]

## 2021-01-29 ENCOUNTER — Encounter (HOSPITAL_COMMUNITY): Payer: Self-pay

## 2021-01-29 ENCOUNTER — Emergency Department (HOSPITAL_COMMUNITY): Payer: Self-pay

## 2021-01-29 ENCOUNTER — Emergency Department (HOSPITAL_COMMUNITY)
Admission: EM | Admit: 2021-01-29 | Discharge: 2021-01-29 | Disposition: A | Discharge status: Home / Self Care | Payer: Self-pay | Attending: Emergency Medicine | Admitting: Emergency Medicine

## 2021-01-29 ENCOUNTER — Other Ambulatory Visit: Payer: Self-pay

## 2021-01-29 DIAGNOSIS — R109 Unspecified abdominal pain: Secondary | ICD-10-CM | POA: Insufficient documentation

## 2021-01-29 DIAGNOSIS — J45909 Unspecified asthma, uncomplicated: Secondary | ICD-10-CM | POA: Insufficient documentation

## 2021-01-29 DIAGNOSIS — R1011 Right upper quadrant pain: Secondary | ICD-10-CM

## 2021-01-29 DIAGNOSIS — K920 Hematemesis: Secondary | ICD-10-CM | POA: Insufficient documentation

## 2021-01-29 DIAGNOSIS — I10 Essential (primary) hypertension: Secondary | ICD-10-CM | POA: Insufficient documentation

## 2021-01-29 DIAGNOSIS — F17210 Nicotine dependence, cigarettes, uncomplicated: Secondary | ICD-10-CM | POA: Insufficient documentation

## 2021-01-29 DIAGNOSIS — K760 Fatty (change of) liver, not elsewhere classified: Secondary | ICD-10-CM

## 2021-01-29 DIAGNOSIS — K625 Hemorrhage of anus and rectum: Secondary | ICD-10-CM | POA: Insufficient documentation

## 2021-01-29 LAB — COMPREHENSIVE METABOLIC PANEL
ALT: 25 U/L (ref 0–44)
AST: 18 U/L (ref 15–41)
Albumin: 3.7 g/dL (ref 3.5–5.0)
Alkaline Phosphatase: 76 U/L (ref 38–126)
Anion gap: 4 — ABNORMAL LOW (ref 5–15)
BUN: 11 mg/dL (ref 6–20)
CO2: 23 mmol/L (ref 22–32)
Calcium: 9.8 mg/dL (ref 8.9–10.3)
Chloride: 107 mmol/L (ref 98–111)
Creatinine, Ser: 0.74 mg/dL (ref 0.44–1.00)
GFR, Estimated: >60 mL/min (ref >60)
Glucose, Bld: 97 mg/dL (ref 70–99)
Potassium: 4 mmol/L (ref 3.5–5.1)
Sodium: 134 mmol/L — ABNORMAL LOW (ref 135–145)
Total Bilirubin: 0.7 mg/dL (ref 0.3–1.2)
Total Protein: 7.3 g/dL (ref 6.5–8.1)

## 2021-01-29 LAB — URINALYSIS, ROUTINE W REFLEX MICROSCOPIC
Bacteria, UA: NONE SEEN
Bilirubin Urine: NEGATIVE
Glucose, UA: NEGATIVE mg/dL
Ketones, ur: NEGATIVE mg/dL
Leukocytes,Ua: NEGATIVE
Nitrite: NEGATIVE
Protein, ur: NEGATIVE mg/dL
Specific Gravity, Urine: 1.015 (ref 1.005–1.030)
pH: 6 (ref 5.0–8.0)

## 2021-01-29 LAB — POC OCCULT BLOOD, ED: Fecal Occult Bld: POSITIVE — AB

## 2021-01-29 LAB — CBC
HCT: 41.2 % (ref 36.0–46.0)
Hemoglobin: 13.9 g/dL (ref 12.0–15.0)
MCH: 32 pg (ref 26.0–34.0)
MCHC: 33.7 g/dL (ref 30.0–36.0)
MCV: 94.9 fL (ref 80.0–100.0)
Platelets: 227 10*3/uL (ref 150–400)
RBC: 4.34 MIL/uL (ref 3.87–5.11)
RDW: 12.2 % (ref 11.5–15.5)
WBC: 9.9 10*3/uL (ref 4.0–10.5)
nRBC: 0 % (ref 0.0–0.2)

## 2021-01-29 LAB — LIPASE, BLOOD: Lipase: 31 U/L (ref 11–51)

## 2021-01-29 MED ORDER — PANTOPRAZOLE SODIUM 20 MG PO TBEC: 20.0000 mg | DELAYED_RELEASE_TABLET | Freq: Every day | ORAL | 0 refills | Status: AC

## 2021-01-29 MED ORDER — PANTOPRAZOLE SODIUM 40 MG IV SOLR
40.0000 mg | Freq: Once | INTRAVENOUS | Status: AC
  Administered 2021-01-29: 40 mg via INTRAVENOUS
  Filled 2021-01-29: qty 40

## 2021-01-29 MED ORDER — SODIUM CHLORIDE 0.9 % IV BOLUS
1000.0000 mL | Freq: Once | INTRAVENOUS | Status: AC
  Administered 2021-01-29: 1000 mL via INTRAVENOUS

## 2021-01-29 MED ORDER — ONDANSETRON HCL 4 MG/2ML IJ SOLN
4.0000 mg | Freq: Once | INTRAMUSCULAR | Status: AC
  Administered 2021-01-29: 4 mg via INTRAVENOUS
  Filled 2021-01-29: qty 2

## 2021-01-29 MED ORDER — ONDANSETRON HCL 4 MG PO TABS: 4.0000 mg | ORAL_TABLET | Freq: Four times a day (QID) | ORAL | 0 refills | Status: DC

## 2021-01-29 NOTE — ED Notes (Signed)
Patient transported to Ultrasound 

## 2021-01-29 NOTE — Discharge Instructions (Addendum)
Work-up is notable for hepatic steatosis, this is also known as fatty liver disease.  Although previously this was caused by alcohol, currently it is usually contributed to by diabetes as well as high fat diet.  I would follow-up with your primary care doctor regarding additional recommendations and management.  Your lab work is reassuring, no signs of acute blood loss.  I would recommend taking pantoprazole for the next 8 weeks and following up with a gastroentero intestinal doctor.  I suspect the bleeding is due to a gastric ulcer.  Taking the PPI will help protect the lining and hopefully close the ulcer if it exists.    If you develop significant blood loss, dizziness, lightheadedness, acute unrelenting abdominal pain I would recommend coming back to the ED for further evaluation.

## 2021-01-29 NOTE — ED Provider Notes (Signed)
[facility] Provider Note   CSN: [id number] Arrival date & time: 01/29/21  1139     History Chief Complaint  Patient presents with   Rectal Bleeding   Hematemesis    Wendy Singh is a 45 y.o. female.   Rectal Bleeding Associated symptoms: abdominal pain and vomiting   Associated symptoms: no fever    Patient presents with hematemesis and rectal bleeding..  Rectal bleeding started 4 days ago, but worse with bowel movements.  She describes it as bright red, but is having increasing amount of blood.  She does have a history of hemorrhoids, but states this feels different.  She started having hematemesis 2 days ago.  Is associated with nausea and abdominal pain.  The pain is exacerbated by eating meals.  She also has reflux disease, does not take a PPI.  She does not drink any alcohol, but she does use ibuprofen up to 3 times daily and has for multiple years.  No previous history of gastric ulcers.  No previous abdominal surgeries other than tubal ligation.  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   HSIL (high grade squamous intraepithelial lesion) on Pap smear of cervix 01/09/2014    Past Surgical History:  Procedure Laterality Date   BREAST BIOPSY  05/2013   CERVICAL BIOPSY  W/ LOOP ELECTRODE EXCISION     CERVICAL CONIZATION W/BX N/A 09/26/2014   Procedure: CONIZATION CERVIX WITH BIOPSY;  Surgeon: Emily Filbert, MD;  Location: [facility];  Service: Gynecology;  Laterality: N/A;   LAPAROSCOPIC BILATERAL SALPINGECTOMY Bilateral 09/26/2014   Procedure: LAPAROSCOPIC BILATERAL SALPINGECTOMY;  Surgeon: Emily Filbert, MD;  Location: [facility];  Service: Gynecology;  Laterality: Bilateral;     OB History     Gravida  3   Para  2   Term  2    Preterm      AB  1   Living  2      SAB  1   IAB      Ectopic      Multiple      Live Births  2           Family History  Problem Relation Age of Onset   Hypertension Father    Heart disease Father    Cancer Father        throat cancer   Diabetes Brother    Heart disease Mother    Cancer Mother        ovarian cancer   Hypertension Mother     Social History   Tobacco Use   Smoking status: Every Day    Packs/day: 0.50    Years: 17.00    Pack years: 8.50    Types: Cigarettes   Smokeless tobacco: Never  Vaping Use   Vaping Use: Never used  Substance Use Topics   Alcohol use: No   Drug use: No    Home Medications Prior to Admission medications   Medication Sig Start Date End Date Taking? Authorizing Provider  acetaminophen (TYLENOL) 500 MG tablet Take 1,000 mg by mouth every 6 (six) hours as needed for moderate pain.    [provider]  cefdinir (OMNICEF) 300 MG capsule Take 1 capsule (300 mg total) by mouth 2 (two) times daily. 08/28/20   Vanessa Kick, MD  ibuprofen (ADVIL) 800 MG tablet Take 800 mg by mouth every 8 (eight) hours as needed for moderate pain.    [provider]  ondansetron (ZOFRAN) 4 MG tablet Take 1 tablet (4 mg total) by mouth every 8 (eight) hours as needed for nausea or vomiting. 09/30/20   Kinnie Feil, PA-C  PROVENTIL HFA 108 (90 Base) MCG/ACT inhaler INHALE 2 PUFFS BY MOUTH EVERY 6 HOURS AS NEEDED FOR WHEEZING OR SHORTNESS OF BREATH Patient taking differently: Inhale 2 puffs into the lungs every 6 (six) hours as needed.  10/17/19   Soyla Dryer, PA-C    Allergies    Penicillins  Review of Systems   Review of Systems  Constitutional:  Negative for fatigue and fever.  Respiratory:  Negative for shortness of breath.   Cardiovascular:  Negative for chest pain.  Gastrointestinal:  Positive for abdominal pain, anal bleeding, hematochezia, nausea and vomiting. Negative for constipation and diarrhea.   Genitourinary:  Positive for dysuria and hematuria. Negative for flank pain.  Musculoskeletal:  Negative for arthralgias.   Physical Exam Updated Vital Signs BP 115/88 (BP Location: Left Arm)   Pulse 80   Temp 98.3 F (36.8 C) (Oral)   Resp (!) 22   Ht '5\' 11"'$  (1.803 m)   Wt 102.1 kg   LMP 12/22/2020 Comment: pt had fallopian tubes removed  SpO2 100%   BMI 31.38 kg/m   Physical Exam Vitals and nursing note reviewed. Exam conducted with a chaperone present.  Constitutional:      Appearance: Normal appearance.  HENT:     Head: Normocephalic and atraumatic.  Eyes:     General: No scleral icterus.       Right eye: No discharge.        Left eye: No discharge.     Extraocular Movements: Extraocular movements intact.     Pupils: Pupils are equal, round, and reactive to light.  Cardiovascular:     Rate and Rhythm: Normal rate and regular rhythm.     Pulses: Normal pulses.     Heart sounds: Normal heart sounds. No murmur heard.   No friction rub. No gallop.  Pulmonary:     Effort: Pulmonary effort is normal. No respiratory distress.     Breath sounds: Normal breath sounds.  Abdominal:     General: Abdomen is flat. Bowel sounds are normal. There is no distension.     Palpations: Abdomen is soft.     Tenderness: There is abdominal tenderness. There is no guarding.     Comments: Abdomen is soft, epigastric tenderness and right upper quadrant tenderness without any guarding or rigidity.  Hemorrhoid noted to the rectum, no bright red blood.  No active bleeding.  Stool is not melanotic.  Skin:    General: Skin is warm and dry.     Coloration: Skin is not jaundiced.  Neurological:     Mental Status: She is alert. Mental status is at baseline.     Coordination: Coordination normal.   ED Results / Procedures / Treatments   Labs (all labs ordered are listed, but only abnormal results are displayed) Labs Reviewed  CBC  LIPASE, BLOOD  COMPREHENSIVE METABOLIC PANEL  URINALYSIS,  ROUTINE W REFLEX MICROSCOPIC  POC OCCULT BLOOD, ED    EKG None  Radiology No results found.  Procedures Procedures   Medications Ordered in ED  Medications - No data to display  ED Course  I have reviewed the triage vital signs and the nursing notes.  Pertinent labs & imaging results that were available during my care of the patient were reviewed by me and considered in my medical decision making (see chart for details).  Clinical Course as of 01/29/21 1835  Wed Jan 29, 2021  1433 CBC No leukocytosis, no anemia.  Do not suspect she is losing significant amount of blood compromising her hemodynamic stability. [HS]  [id number] Comprehensive metabolic panel(!) Mildly hyponatremic, but no electrolyte derangement, no anion gap, no elevated LFTs.  No elevated BUN concerning for upper GI pathology. [HS]  1434 Lipase, blood Lipase is within normal limits, doubt this is pancreatitis. [HS]  1434 US Abdomen Limited RUQ (LIVER/GB) Hepatic steatosis, but no signs of gallbladder pathology. [HS]  1515 Urinalysis, Routine w reflex microscopic(!) Patient is starting her period, no flank pain, doubt this is a kidney stone. [HS]    Clinical Course User Index [HS] Sherrill Raring, PA-C   MDM Rules/Calculators/A&P                           Differential includes gastric ulcer, duodenal ulcer, H. pylori, AVMs, diverticulitis, Mallory-Weiss tear.  Due to her age and lack of risk factors I do not suspect mesenteric ischemia.  Patient reports with hematemesis and rectal bleeding.  She does have hemorrhoids on rectal exam, but they are not actively bleeding so unclear if that is the cause.  There is no melanotic stool or bright red blood, but she was fecal occult positive.  She does not have any anemia.  Not tachycardic or having any hemodynamic instability.  Patient pain is improved, her vitals are stable and her physical exam is not particularly remarkable.   I suspect that her GI bleed is likely from a  gastric ulcer.  Advised her to start taking PPIs and to follow-up with GI.  Referral made, return precautions discussed.  Final Clinical Impression(s) / ED Diagnoses Final diagnoses:  None    Rx / DC Orders ED Discharge Orders     None        Sherrill Raring, Hershal Coria 01/29/21 1837    Davonna Belling, MD 01/30/21 [phone]

## 2021-01-29 NOTE — ED Notes (Signed)
Pt presents with multiple concerns. Pt reports bright red rectal bleeding x 4 days and hx of hemorrhoids and increasing in frequency. Pt also reports she vomited blood this am, bright red. Pt denies drinking alcohol. Pt also concerned d/t long ago hx of syphilis and then recently being [facility] and was told she was positive for syphilis again. Pt requesting blood test here for confirmation

## 2021-01-29 NOTE — ED Notes (Signed)
Pt here with reports of rectal bleeding x 4 days, bright red, hx of hemorrhoids and then today vomited bright red blood

## 2021-01-30 ENCOUNTER — Other Ambulatory Visit: Payer: Self-pay

## 2021-01-30 DIAGNOSIS — Z1231 Encounter for screening mammogram for malignant neoplasm of breast: Secondary | ICD-10-CM

## 2021-01-31 ENCOUNTER — Encounter (HOSPITAL_COMMUNITY)
Admission: EM | Disposition: A | Discharge status: Home / Self Care | Payer: Self-pay | Source: Home / Self Care | Attending: Emergency Medicine

## 2021-01-31 ENCOUNTER — Encounter (HOSPITAL_COMMUNITY): Payer: Self-pay | Admitting: Emergency Medicine

## 2021-01-31 ENCOUNTER — Emergency Department (HOSPITAL_COMMUNITY): Payer: Self-pay | Admitting: Anesthesiology

## 2021-01-31 ENCOUNTER — Ambulatory Visit (HOSPITAL_COMMUNITY)
Admission: EM | Admit: 2021-01-31 | Discharge: 2021-01-31 | Disposition: A | Discharge status: Home / Self Care | Payer: Self-pay | Attending: Emergency Medicine | Admitting: Emergency Medicine

## 2021-01-31 DIAGNOSIS — Z86001 Personal history of in-situ neoplasm of cervix uteri: Secondary | ICD-10-CM | POA: Insufficient documentation

## 2021-01-31 DIAGNOSIS — K922 Gastrointestinal hemorrhage, unspecified: Secondary | ICD-10-CM | POA: Insufficient documentation

## 2021-01-31 DIAGNOSIS — K920 Hematemesis: Secondary | ICD-10-CM

## 2021-01-31 DIAGNOSIS — K2289 Other specified disease of esophagus: Secondary | ICD-10-CM | POA: Insufficient documentation

## 2021-01-31 DIAGNOSIS — Z79899 Other long term (current) drug therapy: Secondary | ICD-10-CM | POA: Insufficient documentation

## 2021-01-31 DIAGNOSIS — Z20822 Contact with and (suspected) exposure to covid-19: Secondary | ICD-10-CM | POA: Insufficient documentation

## 2021-01-31 DIAGNOSIS — F17210 Nicotine dependence, cigarettes, uncomplicated: Secondary | ICD-10-CM | POA: Insufficient documentation

## 2021-01-31 DIAGNOSIS — K2100 Gastro-esophageal reflux disease with esophagitis, without bleeding: Secondary | ICD-10-CM | POA: Insufficient documentation

## 2021-01-31 DIAGNOSIS — I10 Essential (primary) hypertension: Secondary | ICD-10-CM | POA: Insufficient documentation

## 2021-01-31 DIAGNOSIS — Z880 Allergy status to penicillin: Secondary | ICD-10-CM | POA: Insufficient documentation

## 2021-01-31 DIAGNOSIS — R1084 Generalized abdominal pain: Secondary | ICD-10-CM | POA: Insufficient documentation

## 2021-01-31 DIAGNOSIS — N920 Excessive and frequent menstruation with regular cycle: Secondary | ICD-10-CM | POA: Insufficient documentation

## 2021-01-31 HISTORY — PX: BIOPSY: SHX5522

## 2021-01-31 HISTORY — PX: ESOPHAGOGASTRODUODENOSCOPY (EGD) WITH PROPOFOL: SHX5813

## 2021-01-31 HISTORY — DX: Family history of other specified conditions: Z84.89

## 2021-01-31 LAB — CBC WITH DIFFERENTIAL/PLATELET
Abs Immature Granulocytes: 0.04 10*3/uL (ref 0.00–0.07)
Basophils Absolute: 0 10*3/uL (ref 0.0–0.1)
Basophils Relative: 0 %
Eosinophils Absolute: 0.1 10*3/uL (ref 0.0–0.5)
Eosinophils Relative: 1 %
HCT: 41.3 % (ref 36.0–46.0)
Hemoglobin: 13.7 g/dL (ref 12.0–15.0)
Immature Granulocytes: 0 %
Lymphocytes Relative: 22 %
Lymphs Abs: 2.3 10*3/uL (ref 0.7–4.0)
MCH: 31.5 pg (ref 26.0–34.0)
MCHC: 33.2 g/dL (ref 30.0–36.0)
MCV: 94.9 fL (ref 80.0–100.0)
Monocytes Absolute: 0.6 10*3/uL (ref 0.1–1.0)
Monocytes Relative: 6 %
Neutro Abs: 7.5 10*3/uL (ref 1.7–7.7)
Neutrophils Relative %: 71 %
Platelets: 247 10*3/uL (ref 150–400)
RBC: 4.35 MIL/uL (ref 3.87–5.11)
RDW: 12.4 % (ref 11.5–15.5)
WBC: 10.6 10*3/uL — ABNORMAL HIGH (ref 4.0–10.5)
nRBC: 0 % (ref 0.0–0.2)

## 2021-01-31 LAB — I-STAT BETA HCG BLOOD, ED (MC, WL, AP ONLY): I-stat hCG, quantitative: <5 m[IU]/mL (ref <5)

## 2021-01-31 LAB — COMPREHENSIVE METABOLIC PANEL
ALT: 24 U/L (ref 0–44)
AST: 19 U/L (ref 15–41)
Albumin: 3.7 g/dL (ref 3.5–5.0)
Alkaline Phosphatase: 73 U/L (ref 38–126)
Anion gap: 10 (ref 5–15)
BUN: 10 mg/dL (ref 6–20)
CO2: 19 mmol/L — ABNORMAL LOW (ref 22–32)
Calcium: 9.9 mg/dL (ref 8.9–10.3)
Chloride: 105 mmol/L (ref 98–111)
Creatinine, Ser: 0.79 mg/dL (ref 0.44–1.00)
GFR, Estimated: >60 mL/min (ref >60)
Glucose, Bld: 134 mg/dL — ABNORMAL HIGH (ref 70–99)
Potassium: 4 mmol/L (ref 3.5–5.1)
Sodium: 134 mmol/L — ABNORMAL LOW (ref 135–145)
Total Bilirubin: 0.6 mg/dL (ref 0.3–1.2)
Total Protein: 6.9 g/dL (ref 6.5–8.1)

## 2021-01-31 LAB — SARS CORONAVIRUS 2 (TAT 6-24 HRS): SARS Coronavirus 2: NEGATIVE

## 2021-01-31 LAB — APTT: aPTT: 29 seconds (ref 24–36)

## 2021-01-31 LAB — PROTIME-INR
INR: 1 (ref 0.8–1.2)
Prothrombin Time: 13 seconds (ref 11.4–15.2)

## 2021-01-31 LAB — TYPE AND SCREEN
ABO/RH(D): A POS
Antibody Screen: NEGATIVE

## 2021-01-31 SURGERY — ESOPHAGOGASTRODUODENOSCOPY (EGD) WITH PROPOFOL
Anesthesia: Monitor Anesthesia Care

## 2021-01-31 MED ORDER — ALBUTEROL SULFATE HFA 108 (90 BASE) MCG/ACT IN AERS
INHALATION_SPRAY | RESPIRATORY_TRACT | Status: DC | PRN
  Administered 2021-01-31: 4 via RESPIRATORY_TRACT

## 2021-01-31 MED ORDER — PANTOPRAZOLE 80MG IVPB - SIMPLE MED
80.0000 mg | Freq: Once | INTRAVENOUS | Status: AC
  Administered 2021-01-31: 80 mg via INTRAVENOUS
  Filled 2021-01-31: qty 80

## 2021-01-31 MED ORDER — LIDOCAINE VISCOUS HCL 2 % MT SOLN: 15.0000 mL | Freq: Once | OROMUCOSAL | Status: DC

## 2021-01-31 MED ORDER — LACTATED RINGERS IV SOLN
INTRAVENOUS | Status: AC | PRN
  Administered 2021-01-31: 1000 mL via INTRAVENOUS

## 2021-01-31 MED ORDER — ONDANSETRON HCL 4 MG/2ML IJ SOLN
INTRAMUSCULAR | Status: DC | PRN
  Administered 2021-01-31: 4 mg via INTRAVENOUS

## 2021-01-31 MED ORDER — FENTANYL CITRATE (PF) 250 MCG/5ML IJ SOLN
INTRAMUSCULAR | Status: DC | PRN
  Administered 2021-01-31 (×2): 25 ug via INTRAVENOUS
  Administered 2021-01-31: 50 ug via INTRAVENOUS

## 2021-01-31 MED ORDER — SUCCINYLCHOLINE CHLORIDE 200 MG/10ML IV SOSY
PREFILLED_SYRINGE | INTRAVENOUS | Status: DC | PRN
  Administered 2021-01-31: 140 mg via INTRAVENOUS

## 2021-01-31 MED ORDER — LIDOCAINE 2% (20 MG/ML) 5 ML SYRINGE
INTRAMUSCULAR | Status: DC | PRN
  Administered 2021-01-31: 60 mg via INTRAVENOUS

## 2021-01-31 MED ORDER — DEXAMETHASONE SODIUM PHOSPHATE 10 MG/ML IJ SOLN
INTRAMUSCULAR | Status: DC | PRN
  Administered 2021-01-31: 10 mg via INTRAVENOUS

## 2021-01-31 MED ORDER — FENTANYL CITRATE (PF) 100 MCG/2ML IJ SOLN
INTRAMUSCULAR | Status: AC
  Filled 2021-01-31: qty 2

## 2021-01-31 MED ORDER — PROPOFOL 10 MG/ML IV BOLUS
INTRAVENOUS | Status: DC | PRN
  Administered 2021-01-31: 200 mg via INTRAVENOUS

## 2021-01-31 NOTE — Progress Notes (Signed)
Patient having persistent cough and throat irritation, likely from endotracheal intubation, improving at time of discharge

## 2021-01-31 NOTE — Transfer of Care (Signed)
Immediate Anesthesia Transfer of Care Note  Patient: Wendy Singh  Procedure(s) Performed: ESOPHAGOGASTRODUODENOSCOPY (EGD) WITH PROPOFOL BIOPSY  Patient Location: Endoscopy Unit  Anesthesia Type:General  Level of Consciousness: awake, alert  and patient cooperative  Airway & Oxygen Therapy: Patient Spontanous Breathing and Patient connected to face mask oxygen  Post-op Assessment: Report given to RN and Post -op Vital signs reviewed and stable  Post vital signs: Reviewed and stable  Last Vitals:  Vitals Value Taken Time  BP 154/112 01/31/21 1509  Temp 36.5 C 01/31/21 1508  Pulse 106 01/31/21 1511  Resp 16 01/31/21 1511  SpO2 98 % 01/31/21 1511  Vitals shown include unvalidated device data.  Last Pain:  Vitals:   01/31/21 1508  TempSrc: Oral  PainSc: 0-No pain         Complications: No notable events documented.

## 2021-01-31 NOTE — Anesthesia Preprocedure Evaluation (Addendum)
Anesthesia Evaluation  Patient identified by MRN, date of birth, ID band Patient awake    Reviewed: Allergy & Precautions, NPO status , Patient's Chart, lab work & pertinent test results  History of Anesthesia Complications Negative for: history of anesthetic complications  Airway Mallampati: II  TM Distance: >3 FB Neck ROM: Full    Dental  (+) Dental Advisory Given, Edentulous Upper   Pulmonary asthma , Current SmokerPatient did not abstain from smoking.,    Pulmonary exam normal        Cardiovascular hypertension, Normal cardiovascular exam     Neuro/Psych  Headaches, PSYCHIATRIC DISORDERS Depression    GI/Hepatic Neg liver ROS,  Hematemesis, actively nauseous    Endo/Other   Obesity   Renal/GU negative Renal ROS     Musculoskeletal negative musculoskeletal ROS (+)   Abdominal   Peds  Hematology negative hematology ROS (+)   Anesthesia Other Findings   Reproductive/Obstetrics                            Anesthesia Physical Anesthesia Plan  ASA: 2  Anesthesia Plan: General   Post-op Pain Management:    Induction: Intravenous, Rapid sequence and Cricoid pressure planned  PONV Risk Score and Plan: 2 and Treatment may vary due to age or medical condition, Ondansetron, Midazolam and Dexamethasone  Airway Management Planned: Oral ETT  Additional Equipment: None  Intra-op Plan:   Post-operative Plan: Extubation in OR  Informed Consent: I have reviewed the patients History and Physical, chart, labs and discussed the procedure including the risks, benefits and alternatives for the proposed anesthesia with the patient or authorized representative who has indicated his/her understanding and acceptance.     Dental advisory given  Plan Discussed with: CRNA and Anesthesiologist  Anesthesia Plan Comments:        Anesthesia Quick Evaluation

## 2021-01-31 NOTE — Consult Note (Addendum)
[facility] Consult: 12:02 PM 01/31/2021  LOS: 0 days    Referring Provider: ED resident Claud Kelp MD  Primary Care Physician:  Pcp, No Primary Gastroenterologist:  unassigned    Reason for Consultation: Hematemesis and blood per rectum.   HPI: Wendy Singh is a 45 y.o. female.  PMH below.  Suffers from chronic GERD symptoms.  Takes Nexium prn, not daily.  Takes Tums.  Regular medications include ibuprofen 800 mg daily for aches and pains.  Takes Ibuprofen up to 600 mg daily for many years.  No ETOH.  No meds for her hx GERD.  Occasional minor bleeding per rectum she attributes to hemorrhoids.  On Monday she developed more intense bleeding per rectum.  Later that day for dinner she ate pork which normally does not agree with her.  She developed nonbloody, yellow bilious emesis but later on this turn to dark, bloody emesis.  No intense abdominal pain.  No dizziness, no palpitations.  She was seen at [facility], ED 8, 17.DRE w non-bleeding hemorrhoid, no bloody or melenic stool.  Hgb, vital signs re-assuring and dc'd.  Referred to [facility] for an appointment.  She was prescribed Protonix and Zofran.  Vomiting persisted through the night and dark, bloody stools persisted recently as this morning.  Still not a lot of systemic symptoms other than that she just does not feel normal but no dizziness.  No palpitations, no shortness of breath, no abdominal pain.  Vital signs are stable with no hypotension, tachycardia or hypoxia. Hgb 13.9 >> 13.7 (13.4 mid April).  MCV 94.  Platelets, INR, LFTs normal.  Na 134.     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  She is on her feet a lot.  Smokes, a pack of cigarettes lasts 3 to 4 days.  Does not drink alcohol with any regularity.  She goes  months without any drinks. Family history of throat cancer and bleeding ulcers in her father   Past Medical History:  Diagnosis Date   Anemia    Asthma    Depression    Hypertension    Kidney stones    Migraines    Ovarian cyst    Vaginal Pap smear, abnormal     Past Surgical History:  Procedure Laterality Date   BREAST BIOPSY  05/2013   CERVICAL BIOPSY  W/ LOOP ELECTRODE EXCISION     CERVICAL CONIZATION W/BX N/A 09/26/2014   Procedure: CONIZATION CERVIX WITH BIOPSY;  Surgeon: Emily Filbert, MD;  Location: [facility];  Service: Gynecology;  Laterality: N/A;   LAPAROSCOPIC BILATERAL SALPINGECTOMY Bilateral 09/26/2014   Procedure: LAPAROSCOPIC BILATERAL SALPINGECTOMY;  Surgeon: Emily Filbert, MD;  Location: [facility];  Service: Gynecology;  Laterality: Bilateral;    Prior to Admission medications   Medication Sig Start Date End Date Taking? Authorizing Provider  acetaminophen (TYLENOL) 500 MG tablet Take 1,000 mg by mouth every 6 (six) hours as needed for moderate pain.    [provider]  cefdinir (OMNICEF) 300 MG capsule Take 1 capsule (  300 mg total) by mouth 2 (two) times daily. 08/28/20   Vanessa Kick, MD  ibuprofen (ADVIL) 800 MG tablet Take 800 mg by mouth every 8 (eight) hours as needed for moderate pain.    [provider]  ondansetron (ZOFRAN) 4 MG tablet Take 1 tablet (4 mg total) by mouth every 6 (six) hours. 01/29/21   Sherrill Raring, PA-C  pantoprazole (PROTONIX) 20 MG tablet Take 1 tablet (20 mg total) by mouth daily. 01/29/21   Sherrill Raring, PA-C  PROVENTIL HFA 108 (90 Base) MCG/ACT inhaler INHALE 2 PUFFS BY MOUTH EVERY 6 HOURS AS NEEDED FOR WHEEZING OR SHORTNESS OF BREATH Patient taking differently: Inhale 2 puffs into the lungs every 6 (six) hours as needed.  10/17/19   Soyla Dryer, PA-C    Scheduled Meds:  Infusions:  PRN Meds:    Allergies as of 01/31/2021 - Review Complete 01/31/2021  Allergen Reaction Noted   Penicillins Rash 01/09/2014    Family  History  Problem Relation Age of Onset   Hypertension Father    Heart disease Father    Cancer Father        throat cancer   Diabetes Brother    Heart disease Mother    Cancer Mother        ovarian cancer   Hypertension Mother     Social History   Socioeconomic History   Marital status: Single    Spouse name: Not on file   Number of children: 2   Years of education: Not on file   Highest education level: Some college, no degree  Occupational History   Not on file  Tobacco Use   Smoking status: Every Day    Packs/day: 0.50    Years: 17.00    Pack years: 8.50    Types: Cigarettes   Smokeless tobacco: Never  Vaping Use   Vaping Use: Never used  Substance and Sexual Activity   Alcohol use: No   Drug use: No   Sexual activity: Yes    Birth control/protection: None, Condom  Other Topics Concern   Not on file  Social History Narrative   Not on file   Social Determinants of Health   Financial Resource Strain: Not on file  Food Insecurity: Not on file  Transportation Needs: Not on file  Physical Activity: Not on file  Stress: Not on file  Social Connections: Not on file  Intimate Partner Violence: Not on file    REVIEW OF SYSTEMS: Constitutional: Feels funny but no weakness.  No fatigue. ENT:  No nose bleeds Pulm: No shortness of breath.  No cough. CV:  No palpitations, no LE edema.  GU:  No hematuria, no frequency GYN:   Heavy periods that last 11 to 12 days.  Has upcoming appointment with OB for evaluation. GI: See HPI Heme: Other than GI bleeding and menorrhagia no unusual or excessive bleeding Transfusions: None. Neuro:  No headaches, no peripheral tingling or numbness.  No syncope, no seizures. Derm:  No itching, no rash or sores.  Endocrine:  No sweats or chills.  No polyuria or dysuria Immunization: Not queried. Travel:  None beyond local counties in last few months.    PHYSICAL EXAM: Vital signs in last 24 hours: Vitals:   01/31/21 1014  BP:  (!) 162/88  Pulse: 81  Resp: 18  Temp: 98.3 F (36.8 C)  SpO2: 98%   Wt Readings from Last 3 Encounters:  01/29/21 102.1 kg  01/01/21 104.3 kg  09/30/20 96.6 kg  General: Pleasant, comfortable, well-appearing Head: No facial asymmetry or swelling.  No signs of head trauma. Eyes: No scleral icterus.  No conjunctival pallor. Ears: Not hard of hearing Nose: No congestion or discharge Mouth: Good dentition.  Moist, pink, clear mucosa.  Tongue midline. Neck: No JVD, no masses, no thyromegaly. Lungs: Clear bilaterally.  No labored breathing. Heart: RRR.  No MRG.  S1, S2 present Abdomen: Soft.  Not tender.  No masses, HSM, bruits, hernias.  Slight tenderness in the left lower abdomen but no guarding or rebound.  Bowel sounds are active..   Rectal: Patient requested that I not repeat the DRE that was performed yesterday. Musc/Skeltl: No joint redness, swelling or gross deformity. Extremities: No CCE. Neurologic: Oriented x3.  Fully alert.  Moves all 4 limbs without tremor or weakness. Skin: No rashes, sores, telangiectasia, suspicious lesions. Nodes: No cervical adenopathy Psych: Calm, pleasant, cooperative.  Intake/Output from previous day: No intake/output data recorded. Intake/Output this shift: No intake/output data recorded.  LAB RESULTS: Recent Labs    01/29/21 1232 01/31/21 1025  WBC 9.9 10.6*  HGB 13.9 13.7  HCT 41.2 41.3  PLT 227 247   BMET Lab Results  Component Value Date   NA 134 (L) 01/31/2021   NA 134 (L) 01/29/2021   NA 135 09/30/2020   K 4.0 01/31/2021   K 4.0 01/29/2021   K 3.5 09/30/2020   CL 105 01/31/2021   CL 107 01/29/2021   CL 105 09/30/2020   CO2 19 (L) 01/31/2021   CO2 23 01/29/2021   CO2 22 09/30/2020   GLUCOSE 134 (H) 01/31/2021   GLUCOSE 97 01/29/2021   GLUCOSE 95 09/30/2020   BUN 10 01/31/2021   BUN 11 01/29/2021   BUN 10 09/30/2020   CREATININE 0.79 01/31/2021   CREATININE 0.74 01/29/2021   CREATININE 0.78 09/30/2020    CALCIUM 9.9 01/31/2021   CALCIUM 9.8 01/29/2021   CALCIUM 9.8 09/30/2020   LFT Recent Labs    01/29/21 1232 01/31/21 1025  PROT 7.3 6.9  ALBUMIN 3.7 3.7  AST 18 19  ALT 25 24  ALKPHOS 76 73  BILITOT 0.7 0.6   PT/INR Lab Results  Component Value Date   INR 1.0 01/31/2021   Hepatitis Panel No results for input(s): HEPBSAG, HCVAB, HEPAIGM, HEPBIGM in the last 72 hours. C-Diff No components found for: CDIFF Lipase     Component Value Date/Time   LIPASE 31 01/29/2021 1232    Drugs of Abuse  No results found for: LABOPIA, COCAINSCRNUR, LABBENZ, AMPHETMU, THCU, LABBARB   RADIOLOGY STUDIES: US Abdomen Limited RUQ (LIVER/GB)  Result Date: 01/29/2021 CLINICAL DATA:  Hematemesis x4 days. EXAM: ULTRASOUND ABDOMEN LIMITED RIGHT UPPER QUADRANT COMPARISON:  CT of pelvis, 09/30/2020 and 05/02/2020. FINDINGS: Gallbladder: Nondistended. No gallstones or wall thickening visualized. No sonographic Murphy sign noted by sonographer. Common bile duct: Diameter: 5 mm Liver: No focal lesion identified. Increased hepatic parenchymal echotexture. Smooth contour. Portal vein is patent on color Doppler imaging with normal direction of blood flow towards the liver. Other: No perihepatic ascites. IMPRESSION: 1. No acute sonographic findings in the right upper quadrant. 2. Echogenic liver. Findings most commonly seen in hepatic steatosis, with differential including hepatitis and/or fibrosis. Electronically Signed   By: Michaelle Birks M.D.   On: 01/29/2021 14:25     IMPRESSION:     Hematemesis.  Takes 800 mg ibuprofen daily long-term and has a long prodrome of upper GI reflux symptoms.  Rule out NSAID ulcer.  Rule out Mallory-Weiss  tear.  Additionally has had blood per rectum, she describes this as red blood.  Showed me a photograph of this specimen and there looks to be a dark amount of deep red blood at the bottom of the commode which is leaching a halo of red blood.  This stool could be from the upper GI  bleed versus a separate lower GI bleed as she does have a history of minor hemorrhoidal bleeding.  BUN is normal.  Hgb is reassuringly stable and she is not anemic.    Echogenic liver per [place].  Steatosis and/or fibrosis.  Nondrinker.  Coags, platelets normal.     Chronic menorrhagia.  Has upcoming appointment with her GYN.    PLAN:        EGD today.   Azucena Freed  01/31/2021, 12:02 PM Phone [phone]    Attending Physician's Attestation   I have taken an interval history, reviewed the chart and examined the patient.   Pt with recurrent hematemesis, with frequent GERD symptoms on low dose PPI and frequent NSAID use.  Reassuringly normal BUN, Hgb and vitals so doubt high risk lesion.  Suspect esophagitis.  Will proceed with upper endoscopy today to assess source of hematemesis and provide hemostasis if needed.  If no high risk lesions, patient should be able to be discharged home.  I agree with the Advanced Practitioner's note, impression, and recommendations with updates and my documentation above.   Dustin Flock, MD [facility]

## 2021-01-31 NOTE — Anesthesia Procedure Notes (Signed)
Procedure Name: Intubation Date/Time: 01/31/2021 2:32 PM Performed by: Thelma Comp, CRNA Pre-anesthesia Checklist: Patient identified, Emergency Drugs available, Suction available and Patient being monitored Patient Re-evaluated:Patient Re-evaluated prior to induction Oxygen Delivery Method: Circle System Utilized Preoxygenation: Pre-oxygenation with 100% oxygen Induction Type: IV induction Ventilation: Mask ventilation without difficulty Laryngoscope Size: Mac and 3 Grade View: Grade I Tube type: Oral Number of attempts: 1 Airway Equipment and Method: Stylet Placement Confirmation: ETT inserted through vocal cords under direct vision, positive ETCO2 and breath sounds checked- equal and bilateral Secured at: 21 cm Tube secured with: Tape Dental Injury: Teeth and Oropharynx as per pre-operative assessment

## 2021-01-31 NOTE — Anesthesia Postprocedure Evaluation (Signed)
Anesthesia Post Note  Patient: Wendy Singh  Procedure(s) Performed: ESOPHAGOGASTRODUODENOSCOPY (EGD) WITH PROPOFOL BIOPSY     Patient location during evaluation: PACU Anesthesia Type: General Level of consciousness: awake and alert Pain management: pain level controlled Vital Signs Assessment: post-procedure vital signs reviewed and stable Respiratory status: spontaneous breathing, nonlabored ventilation and respiratory function stable Cardiovascular status: stable and blood pressure returned to baseline Anesthetic complications: no   No notable events documented.  Last Vitals:  Vitals:   01/31/21 1532 01/31/21 1603  BP: (!) 164/93 (!) 112/52  Pulse:  96  Resp:  17  Temp:    SpO2:  98%    Last Pain:  Vitals:   01/31/21 1528  TempSrc:   PainSc: 0-No pain                 Audry Pili

## 2021-01-31 NOTE — ED Notes (Signed)
GI Provider at bedside. 

## 2021-01-31 NOTE — ED Triage Notes (Signed)
Patient here for revaluation after going to [facility] ED over the weekend with complaint of hematemesis. Patient states she was told bleeding should resolve within 24 hours, patient states bleeding has not changed. Patient is alert, oriented, and in no apparent distress at this time.

## 2021-01-31 NOTE — ED Provider Notes (Signed)
   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 I spoke with GI specialist Dr. Candis Schatz who states upper endoscopy was unremarkable. Sent back to ED for discharge.   Vitals:   01/31/21 1532 01/31/21 1603  BP: (!) 164/93 (!) 112/52  Pulse:  96  Resp:  17  Temp:    SpO2:  98%   Physical Exam Vitals and nursing note reviewed.  Constitutional:      Appearance: Normal appearance.  HENT:     Mouth/Throat:     Mouth: Mucous membranes are moist.     Pharynx: Oropharynx is clear.  Abdominal:     Palpations: Abdomen is soft.     Tenderness: There is no abdominal tenderness.  Neurological:     Mental Status: She is alert and oriented to person, place, and time.     GCS: GCS eye subscore is 4. GCS verbal subscore is 5. GCS motor subscore is 6.     Patient has stable hemoglobin and vitals at this time. Clear oropharynx. No active bleeding. Patient in no distress and overall condition stable. Patient has recommendations from GI for close follow up for rectal bleeding. Detailed discussions were had with the patient regarding current findings.The patient has been instructed to return immediately if the symptoms worsen in any way for re-evaluation. Patient verbalized understanding and is in agreement with current care plan. All questions answered prior to discharge.    Campbell Stall P, [place] [id number]

## 2021-01-31 NOTE — ED Provider Notes (Signed)
[facility] EMERGENCY DEPARTMENT Provider Note   CSN: [id number] Arrival date & time: 01/31/21  1009     History Chief Complaint  Patient presents with   GI Bleeding    Wendy Singh is a 45 y.o. female.  HPI  45 year old female with a past ministry of anemia, depression, hypertension presenting to the emergency department with gastrointestinal bleeding.  Patient reports around 4 days ago, she started to experience nausea, vomiting, and prior blood per rectum.  She states that initially her her emesis did not include any blood but after 2-3 episodes on the first day, she started noticing some bright red blood in her emesis.  She states that every day, she has had at least 2 episodes of emesis.  Anytime she tries to eat solid food in last 4 days, she will throw up.  She is able to tolerate water.  She states that her emesis at this point is now almost entirely blood, almost no food contents.  She states that she has also continued to have bright red blood per rectum.  It is also dark red.  No melena.  She has generalized abdominal pain.  She states that she is taken around 800 mg of ibuprofen twice daily every day for the last several years.  She was seen at [facility] 4 days ago where she had a positive Hemoccult, but hemoglobin was normal so she was discharged home.  She states that she was told her symptoms would resolve quickly, but they have persisted.  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   HSIL (high grade squamous intraepithelial lesion) on Pap smear of cervix 01/09/2014    Past Surgical History:  Procedure Laterality Date   BREAST BIOPSY  05/2013   CERVICAL BIOPSY  W/ LOOP  ELECTRODE EXCISION     CERVICAL CONIZATION W/BX N/A 09/26/2014   Procedure: CONIZATION CERVIX WITH BIOPSY;  Surgeon: Emily Filbert, MD;  Location: [facility];  Service: Gynecology;  Laterality: N/A;   LAPAROSCOPIC BILATERAL SALPINGECTOMY Bilateral 09/26/2014   Procedure: LAPAROSCOPIC BILATERAL SALPINGECTOMY;  Surgeon: Emily Filbert, MD;  Location: [facility];  Service: Gynecology;  Laterality: Bilateral;     OB History     Gravida  3   Para  2   Term  2   Preterm      AB  1   Living  2      SAB  1   IAB      Ectopic      Multiple      Live Births  2           Family History  Problem Relation Age of Onset   Hypertension Father    Heart disease Father    Cancer Father        throat cancer   Diabetes Brother    Heart disease Mother    Cancer Mother        ovarian cancer   Hypertension Mother     Social History   Tobacco Use   Smoking status: Every Day    Packs/day: 0.50  Years: 17.00    Pack years: 8.50    Types: Cigarettes   Smokeless tobacco: Never  Vaping Use   Vaping Use: Never used  Substance Use Topics   Alcohol use: No   Drug use: No    Home Medications Prior to Admission medications   Medication Sig Start Date End Date Taking? Authorizing Provider  acetaminophen (TYLENOL) 500 MG tablet Take 1,000 mg by mouth every 6 (six) hours as needed for moderate pain.    [provider]  cefdinir (OMNICEF) 300 MG capsule Take 1 capsule (300 mg total) by mouth 2 (two) times daily. 08/28/20   Vanessa Kick, MD  ibuprofen (ADVIL) 800 MG tablet Take 800 mg by mouth every 8 (eight) hours as needed for moderate pain.    [provider]  ondansetron (ZOFRAN) 4 MG tablet Take 1 tablet (4 mg total) by mouth every 6 (six) hours. 01/29/21   Sherrill Raring, PA-C  pantoprazole (PROTONIX) 20 MG tablet Take 1 tablet (20 mg total) by mouth daily. 01/29/21   Sherrill Raring, PA-C  PROVENTIL HFA 108 (90 Base) MCG/ACT inhaler INHALE 2 PUFFS BY MOUTH EVERY 6 HOURS AS  NEEDED FOR WHEEZING OR SHORTNESS OF BREATH Patient taking differently: Inhale 2 puffs into the lungs every 6 (six) hours as needed.  10/17/19   Soyla Dryer, PA-C    Allergies    Penicillins  Review of Systems   Review of Systems  Constitutional:  Negative for chills and fever.  HENT:  Negative for ear pain and sore throat.   Eyes:  Negative for pain and visual disturbance.  Respiratory:  Negative for cough and shortness of breath.   Cardiovascular:  Negative for chest pain and palpitations.  Gastrointestinal:  Positive for abdominal pain, blood in stool, nausea and vomiting.  Genitourinary:  Negative for dysuria and hematuria.  Musculoskeletal:  Negative for arthralgias and back pain.  Skin:  Negative for color change and rash.  Neurological:  Negative for seizures and syncope.  All other systems reviewed and are negative.  Physical Exam Updated Vital Signs BP 98/71   Pulse 76   Temp 97.7 F (36.5 C) (Temporal)   Resp 15   Ht '5\' 11"'$  (1.803 m)   Wt 102.1 kg   SpO2 97%   BMI 31.39 kg/m   Physical Exam Vitals and nursing note reviewed.  Constitutional:      General: She is not in acute distress.    Appearance: Normal appearance. She is well-developed and normal weight. She is not ill-appearing or toxic-appearing.  HENT:     Head: Normocephalic and atraumatic.  Eyes:     Conjunctiva/sclera: Conjunctivae normal.  Cardiovascular:     Rate and Rhythm: Normal rate and regular rhythm.     Heart sounds: No murmur heard. Pulmonary:     Effort: Pulmonary effort is normal. No respiratory distress.     Breath sounds: Normal breath sounds.  Abdominal:     Palpations: Abdomen is soft.     Tenderness: There is no abdominal tenderness.  Genitourinary:    Comments: Patient declines Musculoskeletal:     Cervical back: Neck supple. No rigidity or tenderness.  Skin:    General: Skin is warm and dry.     Capillary Refill: Capillary refill takes less than 2 seconds.   Neurological:     Mental Status: She is alert and oriented to person, place, and time.    ED Results / Procedures / Treatments   Labs (all labs ordered are listed,  but only abnormal results are displayed) Labs Reviewed  COMPREHENSIVE METABOLIC PANEL - Abnormal; Notable for the following components:      Result Value   Sodium 134 (*)    CO2 19 (*)    Glucose, Bld 134 (*)    All other components within normal limits  CBC WITH DIFFERENTIAL/PLATELET - Abnormal; Notable for the following components:   WBC 10.6 (*)    All other components within normal limits  SARS CORONAVIRUS 2 (TAT 6-24 HRS)  PROTIME-INR  APTT  POC OCCULT BLOOD, ED  I-STAT BETA HCG BLOOD, ED ([facility], [facility], AP ONLY)  TYPE AND SCREEN  ABO/RH    EKG None  Radiology No results found.  Procedures Procedures   Medications Ordered in ED Medications  lactated ringers infusion (  Continued from Pre-op 01/31/21 1421)  pantoprazole (PROTONIX) 80 mg /NS 100 mL IVPB (0 mg Intravenous Stopped 01/31/21 1223)    ED Course  I have reviewed the triage vital signs and the nursing notes.  Pertinent labs & imaging results that were available during my care of the patient were reviewed by me and considered in my medical decision making (see chart for details).    MDM Rules/Calculators/A&P                           45 year old female with frequent NSAID use presenting with hematemesis and bright red blood per rectum.  Vital signs reviewed, within acceptable limits.  Physical exam is notable for well-appearing female, no significant abdominal tenderness.  Patient had a recent CT scan several days ago that was unremarkable, I do not believe that repeat imaging is indicated.  Esophagitis or gastritis or peptic ulcer disease seen most likely.  Patient declines a repeat rectal exam.  I reviewed her labs obtained in triage, her hemoglobin is stable.  However, the patient has continued hematemesis concerning for an upper GI bleed.  She  showed me pictures of this, and is essentially all blood at this point therefore Mallory-Weiss tear seems less likely.  We will give IV PPI bolus and consult gastroenterology.  Gastroenterology took the patient to endoscopy for further evaluation and management.  Final Clinical Impression(s) / ED Diagnoses Final diagnoses:  Lower GI bleed    Rx / DC Orders ED Discharge Orders     None        Claud Kelp, MD 01/31/21 1445    Lajean Saver, MD 01/31/21 1553

## 2021-01-31 NOTE — ED Provider Notes (Signed)
Emergency Medicine Provider Triage Evaluation Note  Wendy Singh , a 45 y.o. female  was evaluated in triage.  Pt complains of hematemesis and rectal bleeding.  Patient has had symptoms for 4 days.  She was seen at [facility] 2 days ago, had overall reassuring blood work and a negative ultrasound.  Symptoms persist despite the medication she was given.  She has had 3 episodes of emesis in the last 12 hours, all with blood.  She states she is now having only bleeding when she has a bowel movement, initially was bright red but now it is becoming darker.  She is not on blood thinners.  She reports low abdominal pain/discomfort.  No fevers or chills.  No dizziness, lightheadedness, weakness.  Review of Systems  Positive: N/v, hematemesis, rectal bleeding Negative: fever  Physical Exam  BP (!) 162/88 (BP Location: Left Arm)   Pulse 81   Temp 98.3 F (36.8 C) (Oral)   Resp 18   SpO2 98%  Gen:   Awake, no distress   Resp:  Normal effort  MSK:   Moves extremities without difficulty  Other:  Mild discomfort with palpation of lower abd  Medical Decision Making  Medically screening exam initiated at 10:21 AM.  Appropriate orders placed.  Wendy Singh was informed that the remainder of the evaluation will be completed by another provider, this initial triage assessment does not replace that evaluation, and the importance of remaining in the ED until their evaluation is complete.  Labs, ct   Franchot Heidelberg, PA-C 01/31/21 1023    Luna Fuse, MD 01/31/21 [phone]

## 2021-01-31 NOTE — Op Note (Signed)
[facility] Patient Name: Wendy Singh Procedure Date : 01/31/2021 MRN: [id number] Attending MD: Gladstone Pih. Candis Schatz , MD Date of Birth: Mar 23, 1976 CSN: [id number] Age: 45 Admit Type: Inpatient Procedure:                Upper GI endoscopy Indications:              Hematemesis, but normal hgb, BUN and vitals Providers:                Nicki Reaper E. Candis Schatz, MD, Brooke Person, Laverda Sorenson, Technician, Vickii Penna, CRNA Referring MD:              Medicines:                General Anesthesia Complications:            No immediate complications. Estimated Blood Loss:     Estimated blood loss was minimal. Procedure:                Pre-Anesthesia Assessment:                           - Prior to the procedure, a History and Physical                            was performed, and patient medications and                            allergies were reviewed. The patient's tolerance of                            previous anesthesia was also reviewed. The risks                            and benefits of the procedure and the sedation                            options and risks were discussed with the patient.                            All questions were answered, and informed consent                            was obtained. Prior Anticoagulants: The patient has                            taken no previous anticoagulant or antiplatelet                            agents. ASA Grade Assessment: II - A patient with                            mild systemic disease. After reviewing the risks  and benefits, the patient was deemed in                            satisfactory condition to undergo the procedure.                           After obtaining informed consent, the endoscope was                            passed under direct vision. Throughout the                            procedure, the patient's blood pressure, pulse, and                             oxygen saturations were monitored continuously. The                            [id number] Olympus endoscope was introduced                            through the mouth, and advanced to the third part                            of duodenum. The upper GI endoscopy was                            accomplished without difficulty. The patient                            tolerated the procedure well. Scope In: Scope Out: Findings:      The examined portions of the nasopharynx, oropharynx and larynx were       normal.      The Z-line was irregular.      The exam of the esophagus was otherwise normal.      The entire examined stomach was normal. Biopsies were taken with a cold       forceps for Helicobacter pylori testing. Estimated blood loss was       minimal.      The examined duodenum was normal. Impression:               - The examined portions of the nasopharynx,                            oropharynx and larynx were normal.                           - Z-line irregular.                           - Normal stomach. Biopsied.                           - Normal examined duodenum.                           -  Unclear etiology for the patient's symptoms of                            nausea, vomiting or hematemesis, but there was no                            evidence of esophagitis, peptic ulcer disease or                            signficiant gastritis. No hematin or evidence of                            recent bleeding. Recommendation:           - Return patient to ER for possible discharge same                            day.                           - Advance diet as tolerated.                           - Continue present medications.                           - Await pathology results.                           - Recommend following up with [facility] for                            further outpatient work up of nausea and vomiting.                            - Continue once daily Protonix for GERD symptoms Procedure Code(s):        --- Professional ---                           [phone], Esophagogastroduodenoscopy, flexible,                            transoral; with biopsy, single or multiple Diagnosis Code(s):        --- Professional ---                           K22.8, Other specified diseases of esophagus                           K92.0, Hematemesis CPT copyright 2019 American Medical Association. All rights reserved. The codes documented in this report are preliminary and upon coder review may  be revised to meet current compliance requirements. Shaundrea Carrigg E. Candis Schatz, MD 01/31/2021 3:04:35 PM This report has been signed electronically. Number of Addenda: 0

## 2021-02-03 ENCOUNTER — Encounter (HOSPITAL_COMMUNITY): Payer: Self-pay | Admitting: Gastroenterology

## 2021-02-04 LAB — SURGICAL PATHOLOGY

## 2021-02-28 ENCOUNTER — Encounter (HOSPITAL_COMMUNITY): Payer: Self-pay

## 2021-02-28 ENCOUNTER — Other Ambulatory Visit: Payer: Self-pay

## 2021-02-28 ENCOUNTER — Ambulatory Visit (HOSPITAL_COMMUNITY)
Admission: RE | Admit: 2021-02-28 | Discharge: 2021-02-28 | Disposition: A | Discharge status: Home / Self Care | Payer: Self-pay | Source: Ambulatory Visit | Attending: Obstetrics and Gynecology | Admitting: Obstetrics and Gynecology

## 2021-02-28 ENCOUNTER — Inpatient Hospital Stay (HOSPITAL_COMMUNITY): Payer: Self-pay | Attending: Obstetrics and Gynecology | Admitting: *Deleted

## 2021-02-28 VITALS — BP 134/63 | Wt 235.0 lb

## 2021-02-28 DIAGNOSIS — Z1231 Encounter for screening mammogram for malignant neoplasm of breast: Secondary | ICD-10-CM | POA: Insufficient documentation

## 2021-02-28 DIAGNOSIS — Z01419 Encounter for gynecological examination (general) (routine) without abnormal findings: Secondary | ICD-10-CM

## 2021-02-28 NOTE — Patient Instructions (Addendum)
Explained breast self awareness with [name]. Pap smear completed today. Let patient know that if today's Pap smear is normal and HPV negative that her next Pap smear is due in one year due to her history of cervical cancer. Referred patient to [facility] for a screening mammogram. Appointment scheduled Friday, February 28, 2021 at 1000. Patient escorted to [place] following B[facility] appointment for her screening mammogram. Let patient know will follow up with her within the next couple weeks with results of Pap smear by phone. Informed patient that [place] will follow-up with her within the next couple of weeks with results of her mammogram by letter or phone. Smoking cessation discussed with patient. Patient referred to the [facility] Quit line and given information about smoking cessation classes offered at the [facility]. Wendy Singh verbalized understanding.  [name], Arvil Chaco, RN 11:50 AM

## 2021-02-28 NOTE — Progress Notes (Signed)
[name] is a 45 y.o. [id number] female who presents to [facility] clinic today with complaint of a right breast lump since 2015 that comes and goes. Patient stated she is unable to feel it today. Patient stated she had the area biopsied around 2015 and it was benign. Patient stated it flares up during her menstrual period. Patient denied pain today.    Pap Smear: Pap smear completed today. Last Pap smear was 07/28/2018 at [facility] clinic and was normal with negative HPV. Patients previous Pap smear 06/17/2017 was normal with negative HPV. Patient has a history of an abnormal Pap smear 06/13/2013 at the 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 Patient had a colposcopy completed 01/15/2014 that showed CIN-III, LEEP 03/26/2014 that showed CIN III, and a CKC 09/26/2014 for follow-up. Per patient the Pap smear on 06/13/2013 is the only abnormal Pap smear she has had. Last three Pap smear, colposcopy, LEEP, and CKC results are in Epic.   Physical exam: Breasts Right breast slightly larger than left breast that is consistent with findings from previous exam 07/28/2018. No skin abnormalities bilateral breasts. No nipple retraction bilateral breasts. No nipple discharge bilateral breasts. No lymphadenopathy. No lumps palpated bilateral breasts. Unable to palpate a lump in patients area of concern within the right breast. No complaints of pain or tenderness on exam.     MM DIAG BREAST TOMO BILATERAL  Result Date: 06/17/2017 CLINICAL DATA:  45 year old female with a left breast palpable abnormality. Benign right breast biopsy September 2014. EXAM: 2D DIGITAL DIAGNOSTIC BILATERAL MAMMOGRAM WITH CAD AND ADJUNCT TOMO BILATERAL BREAST ULTRASOUND COMPARISON:  Previous exam(s). ACR Breast Density Category b: There are scattered areas of fibroglandular density. FINDINGS: No suspicious masses or calcifications seen in either breast. An initially questioned asymmetry in the slightly upper outer  anterior right breast demonstrates imaging features suggestive of focal fibroglandular tissue on the additional spot compression tomograms. Spot compression tangential tomograms were performed over the palpable area of concern in the upper-outer left breast with no definite mammographic abnormality identified. Mammographic images were processed with CAD. Physical examination of the upper-outer left breast does not reveal any palpable masses. Targeted ultrasound of the left breast was performed. No suspicious masses or abnormalities identified, only normal-appearing fibroglandular tissue seen. Targeted ultrasound of the upper outer anterior right breast was performed with an area of focal heterogeneous fibroglandular tissue seen at 10 o'clock 3 cm from nipple. This corresponds well with the asymmetry seen in the right breast at mammography. IMPRESSION: No findings of malignancy in either breast. No mammographic or sonographic correlate for the left breast palpable abnormality. RECOMMENDATION: 1. Recommend further management of the left breast palpable abnormality be based on clinical assessment. 2.  Screening mammogram in one year.(Code:SM-B-01Y) I have discussed the findings and recommendations with the patient. Results were also provided in writing at the conclusion of the visit. If applicable, a reminder letter will be sent to the patient regarding the next appointment. BI-RADS CATEGORY  1: Negative. Electronically Signed   By: Wendy Singh M.D.   On: 06/17/2017 11:55   MS DIGITAL SCREENING TOMO BILATERAL  Result Date: 07/28/2018 CLINICAL DATA:  Screening. EXAM: DIGITAL SCREENING BILATERAL MAMMOGRAM WITH TOMO AND CAD COMPARISON:  Previous exam(s). ACR Breast Density Category b: There are scattered areas of fibroglandular density. FINDINGS: There are no findings suspicious for malignancy. Images were processed with CAD. IMPRESSION: No mammographic evidence of malignancy. A result letter of this screening  mammogram will be mailed directly to  the patient. RECOMMENDATION: Screening mammogram in one year. (Code:SM-B-01Y) BI-RADS CATEGORY  1: Negative. Electronically Signed   By: Wendy Singh M.D.   On: 07/28/2018 14:32     Pelvic/Bimanual Ext Genitalia No lesions, no swelling and no discharge observed on external genitalia.        Vagina Vagina pink and normal texture. No lesions and thick white discharge observed in vagina. Wet prep completed.       Cervix Cervix is present. Cervix pink and of normal texture. Thick white discharge observed on cervix.   Uterus Uterus is present and palpable. Uterus in normal position and normal size.        Adnexae Bilateral ovaries present and palpable. No tenderness on palpation.         Rectovaginal No rectal exam completed today since patient had no rectal complaints. No skin abnormalities observed on exam.     Smoking History: Smoking cessation discussed with patient. Patient referred to the [facility] Quit Singh and given information about smoking cessation classes offered at the [facility].   Patient Navigation: Patient education provided. Access to services provided for patient through [facility]CP program.    Breast and Cervical Cancer Risk Assessment: Patient has a family history of her maternal grandmother having breast cancer. patient has no known genetic mutations or history of radiation treatment to the chest before age 9. Patient has a history of cervical dysplasia. Patient has no history of being immunocompromised or DES exposure in-utero.  Risk Assessment     Risk Scores       02/28/2021 07/28/2018   Last edited by: Wendy Parish, RN Wendy Hang, LPN   5-year risk: 0.7 % 0.6 %   Lifetime risk: 8 % 8.2 %           A: [facility] exam with pap smear Complaint of right breast lump since 2015.  P: Referred patient to [place] for a screening mammogram. Appointment scheduled Friday, February 28, 2021  at 1000.  Wendy Parish, RN 02/28/2021 10:29 AM

## 2021-03-03 ENCOUNTER — Telehealth: Payer: Self-pay

## 2021-03-03 ENCOUNTER — Inpatient Hospital Stay (HOSPITAL_COMMUNITY): Admission: RE | Admit: 2021-03-03 | Payer: Self-pay | Source: Ambulatory Visit

## 2021-03-03 DIAGNOSIS — B9689 Other specified bacterial agents as the cause of diseases classified elsewhere: Secondary | ICD-10-CM

## 2021-03-03 DIAGNOSIS — N760 Acute vaginitis: Secondary | ICD-10-CM

## 2021-03-03 LAB — CERVICOVAGINAL ANCILLARY ONLY
Bacterial Vaginitis (gardnerella): POSITIVE — AB
Candida Glabrata: NEGATIVE
Candida Vaginitis: NEGATIVE
Comment: NEGATIVE
Comment: NEGATIVE
Comment: NEGATIVE
Comment: NEGATIVE
Trichomonas: NEGATIVE

## 2021-03-03 MED ORDER — METRONIDAZOLE 500 MG PO TABS: 500.0000 mg | ORAL_TABLET | Freq: Two times a day (BID) | ORAL | 0 refills | Status: AC

## 2021-03-03 NOTE — Telephone Encounter (Signed)
Called patient to give wet prep results. Informed patient that wet prep showed bacterial vaginosis. Flagyl will be prescribed BID for 7 days. No alcohol while taking medication. Patient voiced understanding.

## 2021-03-03 NOTE — Addendum Note (Signed)
Addended by: Jonna Clark E on: 03/03/2021 10:36 AM   Modules accepted: Orders

## 2021-03-05 ENCOUNTER — Telehealth: Payer: Self-pay

## 2021-03-05 LAB — CYTOLOGY - PAP
Comment: NEGATIVE
Diagnosis: NEGATIVE
High risk HPV: NEGATIVE

## 2021-03-05 NOTE — Telephone Encounter (Signed)
Called patient to give pap results. Informed patient that pap was normal and HPV was negative. Next pap will be due in 1 year because of previous hx. Patient voiced understanding.  Mailed patient financial assistance application at patient's request.

## 2021-03-06 ENCOUNTER — Other Ambulatory Visit (HOSPITAL_COMMUNITY): Payer: Self-pay | Admitting: Obstetrics and Gynecology

## 2021-03-06 DIAGNOSIS — R928 Other abnormal and inconclusive findings on diagnostic imaging of breast: Secondary | ICD-10-CM

## 2021-03-20 ENCOUNTER — Encounter: Payer: Self-pay | Admitting: Adult Health

## 2021-03-25 ENCOUNTER — Ambulatory Visit (HOSPITAL_COMMUNITY)
Admission: RE | Admit: 2021-03-25 | Discharge: 2021-03-25 | Disposition: A | Discharge status: Home / Self Care | Payer: Self-pay | Source: Ambulatory Visit | Attending: Obstetrics and Gynecology | Admitting: Obstetrics and Gynecology

## 2021-03-25 ENCOUNTER — Other Ambulatory Visit: Payer: Self-pay

## 2021-03-25 ENCOUNTER — Other Ambulatory Visit (HOSPITAL_COMMUNITY): Payer: Self-pay | Admitting: Obstetrics and Gynecology

## 2021-03-25 DIAGNOSIS — R928 Other abnormal and inconclusive findings on diagnostic imaging of breast: Secondary | ICD-10-CM

## 2021-04-01 ENCOUNTER — Encounter (HOSPITAL_COMMUNITY): Payer: Self-pay

## 2021-04-08 ENCOUNTER — Ambulatory Visit (HOSPITAL_COMMUNITY)
Admission: RE | Admit: 2021-04-08 | Discharge: 2021-04-08 | Disposition: A | Discharge status: Home / Self Care | Payer: Self-pay | Source: Ambulatory Visit | Attending: Obstetrics and Gynecology | Admitting: Obstetrics and Gynecology

## 2021-04-08 ENCOUNTER — Other Ambulatory Visit (HOSPITAL_COMMUNITY): Payer: Self-pay | Admitting: Obstetrics and Gynecology

## 2021-04-08 ENCOUNTER — Other Ambulatory Visit: Payer: Self-pay

## 2021-04-08 ENCOUNTER — Encounter (HOSPITAL_COMMUNITY): Payer: Self-pay

## 2021-04-08 DIAGNOSIS — R928 Other abnormal and inconclusive findings on diagnostic imaging of breast: Secondary | ICD-10-CM

## 2021-04-08 MED ORDER — LIDOCAINE-EPINEPHRINE (PF) 1 %-1:200000 IJ SOLN
10.0000 mL | Freq: Once | INTRAMUSCULAR | Status: AC
  Administered 2021-04-08: 10 mL via INTRADERMAL

## 2021-04-08 MED ORDER — LIDOCAINE HCL (PF) 2 % IJ SOLN
10.0000 mL | Freq: Once | INTRAMUSCULAR | Status: DC
Start: 2021-04-08 — End: 2021-04-09

## 2021-04-08 MED ORDER — LIDOCAINE HCL (PF) 2 % IJ SOLN
INTRAMUSCULAR | Status: AC
  Administered 2021-04-08: 10 mL
  Filled 2021-04-08: qty 10

## 2021-04-08 NOTE — Progress Notes (Signed)
PT tolerated left breast biopsy well today with NAD noted. PT verbalized understanding of discharge instructions. PT ambulated back to the mammogram area this time and given an ice pack. Specimens sent to lab at this time for processing.

## 2021-04-09 ENCOUNTER — Other Ambulatory Visit (HOSPITAL_COMMUNITY): Payer: Self-pay | Admitting: Obstetrics and Gynecology

## 2021-04-09 DIAGNOSIS — R928 Other abnormal and inconclusive findings on diagnostic imaging of breast: Secondary | ICD-10-CM

## 2021-04-09 LAB — SURGICAL PATHOLOGY

## 2021-04-10 ENCOUNTER — Encounter: Payer: Self-pay | Admitting: Adult Health

## 2021-04-24 ENCOUNTER — Encounter: Payer: Self-pay | Admitting: Obstetrics & Gynecology

## 2021-04-24 ENCOUNTER — Encounter: Payer: Self-pay | Admitting: Adult Health

## 2021-04-30 ENCOUNTER — Other Ambulatory Visit: Payer: Self-pay | Admitting: Surgery

## 2021-04-30 DIAGNOSIS — N6322 Unspecified lump in the left breast, upper inner quadrant: Secondary | ICD-10-CM

## 2021-05-06 ENCOUNTER — Other Ambulatory Visit: Payer: Self-pay | Admitting: Surgery

## 2021-05-06 DIAGNOSIS — N6322 Unspecified lump in the left breast, upper inner quadrant: Secondary | ICD-10-CM

## 2021-05-13 ENCOUNTER — Encounter: Payer: Self-pay | Admitting: Obstetrics & Gynecology

## 2021-05-19 ENCOUNTER — Encounter: Payer: Self-pay | Admitting: Adult Health

## 2021-05-22 ENCOUNTER — Other Ambulatory Visit: Payer: Self-pay

## 2021-05-22 ENCOUNTER — Encounter (HOSPITAL_BASED_OUTPATIENT_CLINIC_OR_DEPARTMENT_OTHER): Payer: Self-pay | Admitting: Surgery

## 2021-05-27 ENCOUNTER — Ambulatory Visit
Admission: RE | Admit: 2021-05-27 | Discharge: 2021-05-27 | Disposition: A | Discharge status: Home / Self Care | Payer: No Typology Code available for payment source | Source: Ambulatory Visit | Attending: Surgery | Admitting: Surgery

## 2021-05-27 DIAGNOSIS — N6322 Unspecified lump in the left breast, upper inner quadrant: Secondary | ICD-10-CM

## 2021-05-27 MED ORDER — ENSURE PRE-SURGERY PO LIQD: 296.0000 mL | Freq: Once | ORAL | Status: DC

## 2021-05-27 NOTE — H&P (Signed)
REFERRING PHYSICIAN: Constant, Peggy, MD  PROVIDER: Beverlee Nims, MD  MRN: [id number] DOB: 11/07/1975 DATE OF ENCOUNTER: 04/30/2021 Subjective  Chief Complaint: Left breast mass  History of Present Illness: Wendy Singh is a 45 y.o. female who is seen today as an office consultation at the request of Dr. Elly Modena for evaluation of left breast mass.   This is a 45 year old female who gone for screening mammography when she was found to have a mass in the upper outer quadrant of the left breast. It measured approximate 3.7 cm in size. She underwent a biopsy of this showing breast parenchyma and fibrocystic changes. The biopsy was only at the anterior extent of the mass. She has a strong family history of breast cancer including her maternal grandmother. She denies nipple discharge. Radiology recommended either surgical excision of the mass or a biopsy of the posterior extent. She currently has some discomfort from the biopsy but is otherwise without complaints. She has no cardiopulmonary issues.  Review of Systems: A complete review of systems was obtained from the patient. I have reviewed this information and discussed as appropriate with the patient. See HPI as well for other ROS.  ROS   Medical History: Past Medical History:  Diagnosis Date   Anemia   Anxiety   Asthma, unspecified asthma severity, unspecified whether complicated, unspecified whether persistent   GERD (gastroesophageal reflux disease)   Patient Active Problem List  Diagnosis   CIN III with severe dysplasia   Hematemesis with nausea   HSIL (high grade squamous intraepithelial lesion) on Pap smear of cervix   Past Surgical History:  Procedure Laterality Date   BREAST EXCISIONAL BIOPSY   CERVICAL CONE BIOPSY 09/2014   EGD 01/2021   LAPAROSCOPIC SALPINGECTOMY Bilateral 09/2014    Allergies  Allergen Reactions   Penicillins Rash  Has patient had a PCN reaction causing immediate rash,  facial/tongue/throat swelling, SOB or lightheadedness with hypotension: Yes Has patient had a PCN reaction causing severe rash involving mucus membranes or skin necrosis: Yes Has patient had a PCN reaction that required hospitalization: No Has patient had a PCN reaction occurring within the last 10 years: Yes If all of the above answers are "NO", then may proceed with Cephalosporin use.   Current Outpatient Medications on File Prior to Visit  Medication Sig Dispense Refill   albuterol (PROVENTIL HFA) 90 mcg/actuation inhaler INHALE 2 PUFFS BY MOUTH EVERY 6 HOURS AS NEEDED FOR WHEEZING OR SHORTNESS OF BREATH   pantoprazole (PROTONIX) 20 MG DR tablet Take 1 tablet (20 mg total) by mouth once daily   No current facility-administered medications on file prior to visit.   History reviewed. No pertinent family history.   Social History   Tobacco Use  Smoking Status Every Day   Types: Cigarettes  Smokeless Tobacco Never    Social History   Socioeconomic History   Marital status: Single  Tobacco Use   Smoking status: Every Day  Types: Cigarettes   Smokeless tobacco: Never  Vaping Use   Vaping Use: Never used  Substance and Sexual Activity   Alcohol use: Not Currently   Drug use: Yes  Types: Marijuana   Objective:   Vitals:  04/30/21 1042  BP: 130/82  Pulse: 101  Temp: 36.9 C (98.4 F)  SpO2: 99%  Weight: (!) 109.9 kg (242 lb 3.2 oz)  Height: 182.9 cm (6')   Body mass index is 32.85 kg/m.  Physical Exam   She appears well on exam  There are no palpable  breast masses. There is no axillary adenopathy. The nipple areolar complex is normal.  Lungs clear CV RRR Abd soft, NT,ND Neuro grossly intaqct  Labs, Imaging and Diagnostic Testing: I have reviewed her breast mammogram, ultrasound, and pathology results  Assessment and Plan:  Diagnoses and all orders for this visit:  Mass of upper outer quadrant of left breast    I have reviewed her notes in the  electronic medical records. I have reviewed her mammograms, ultrasound, and pathology results. She does have an indeterminate left breast mass. We discussed further biopsies by radiology versus removal of the mass for complete histologic evaluation. We discussed the possibility of missing a malignancy with biopsy alone. Given her family history she wishes to proceed with a left breast lumpectomy. I next discussed proceeding with a radioactive seed guided left breast lumpectomy. I explained the surgical procedure to her in detail. This would be with a bracketed radioactive seeds placed by radiology. I discussed the risk of the procedure. These risk include but are not limited to bleeding, infection, injury to surrounding structures, the need for further surgery if malignancy is found, cardiopulmonary issues, postoperative recovery, etc. She understands and wished to proceed with surgery which will be scheduled.

## 2021-05-27 NOTE — Progress Notes (Signed)

## 2021-05-28 ENCOUNTER — Ambulatory Visit (HOSPITAL_BASED_OUTPATIENT_CLINIC_OR_DEPARTMENT_OTHER): Payer: No Typology Code available for payment source | Admitting: Certified Registered"

## 2021-05-28 ENCOUNTER — Ambulatory Visit
Admission: RE | Admit: 2021-05-28 | Discharge: 2021-05-28 | Disposition: A | Discharge status: Home / Self Care | Payer: Self-pay | Source: Ambulatory Visit | Attending: Surgery | Admitting: Surgery

## 2021-05-28 ENCOUNTER — Encounter (HOSPITAL_BASED_OUTPATIENT_CLINIC_OR_DEPARTMENT_OTHER)
Admission: RE | Disposition: A | Discharge status: Home / Self Care | Payer: Self-pay | Source: Home / Self Care | Attending: Surgery

## 2021-05-28 ENCOUNTER — Encounter (HOSPITAL_BASED_OUTPATIENT_CLINIC_OR_DEPARTMENT_OTHER): Payer: Self-pay | Admitting: Surgery

## 2021-05-28 ENCOUNTER — Ambulatory Visit (HOSPITAL_BASED_OUTPATIENT_CLINIC_OR_DEPARTMENT_OTHER)
Admission: RE | Admit: 2021-05-28 | Discharge: 2021-05-28 | Disposition: A | Discharge status: Home / Self Care | Payer: No Typology Code available for payment source | Attending: Surgery | Admitting: Surgery

## 2021-05-28 ENCOUNTER — Other Ambulatory Visit: Payer: Self-pay

## 2021-05-28 DIAGNOSIS — N6322 Unspecified lump in the left breast, upper inner quadrant: Secondary | ICD-10-CM

## 2021-05-28 DIAGNOSIS — I10 Essential (primary) hypertension: Secondary | ICD-10-CM | POA: Insufficient documentation

## 2021-05-28 DIAGNOSIS — F17210 Nicotine dependence, cigarettes, uncomplicated: Secondary | ICD-10-CM | POA: Insufficient documentation

## 2021-05-28 DIAGNOSIS — E669 Obesity, unspecified: Secondary | ICD-10-CM | POA: Insufficient documentation

## 2021-05-28 DIAGNOSIS — Z6832 Body mass index (BMI) 32.0-32.9, adult: Secondary | ICD-10-CM | POA: Insufficient documentation

## 2021-05-28 DIAGNOSIS — Z803 Family history of malignant neoplasm of breast: Secondary | ICD-10-CM | POA: Insufficient documentation

## 2021-05-28 DIAGNOSIS — N6032 Fibrosclerosis of left breast: Secondary | ICD-10-CM | POA: Insufficient documentation

## 2021-05-28 DIAGNOSIS — N641 Fat necrosis of breast: Secondary | ICD-10-CM | POA: Insufficient documentation

## 2021-05-28 HISTORY — PX: BREAST LUMPECTOMY WITH RADIOACTIVE SEED LOCALIZATION: SHX6424

## 2021-05-28 LAB — POCT PREGNANCY, URINE: Preg Test, Ur: NEGATIVE

## 2021-05-28 SURGERY — BREAST LUMPECTOMY WITH RADIOACTIVE SEED LOCALIZATION
Anesthesia: General | Site: Breast | Laterality: Left

## 2021-05-28 MED ORDER — ONDANSETRON HCL 4 MG/2ML IJ SOLN
INTRAMUSCULAR | Status: AC
  Filled 2021-05-28: qty 2

## 2021-05-28 MED ORDER — CHLORHEXIDINE GLUCONATE CLOTH 2 % EX PADS: 6.0000 | MEDICATED_PAD | Freq: Once | CUTANEOUS | Status: DC

## 2021-05-28 MED ORDER — CIPROFLOXACIN IN D5W 400 MG/200ML IV SOLN
400.0000 mg | INTRAVENOUS | Status: AC
  Administered 2021-05-28: 10:00:00 400 mg via INTRAVENOUS

## 2021-05-28 MED ORDER — OXYCODONE HCL 5 MG/5ML PO SOLN: 5.0000 mg | Freq: Once | ORAL | Status: AC | PRN

## 2021-05-28 MED ORDER — FENTANYL CITRATE (PF) 100 MCG/2ML IJ SOLN
INTRAMUSCULAR | Status: AC
  Filled 2021-05-28: qty 2

## 2021-05-28 MED ORDER — ACETAMINOPHEN 500 MG PO TABS
ORAL_TABLET | ORAL | Status: AC
  Filled 2021-05-28: qty 2

## 2021-05-28 MED ORDER — PROPOFOL 10 MG/ML IV BOLUS
INTRAVENOUS | Status: AC
  Filled 2021-05-28: qty 20

## 2021-05-28 MED ORDER — MIDAZOLAM HCL 2 MG/2ML IJ SOLN
INTRAMUSCULAR | Status: AC
  Filled 2021-05-28: qty 2

## 2021-05-28 MED ORDER — MIDAZOLAM HCL 5 MG/5ML IJ SOLN
INTRAMUSCULAR | Status: DC | PRN
  Administered 2021-05-28: 2 mg via INTRAVENOUS

## 2021-05-28 MED ORDER — ONDANSETRON HCL 4 MG/2ML IJ SOLN
INTRAMUSCULAR | Status: DC | PRN
  Administered 2021-05-28 (×2): 4 mg via INTRAVENOUS

## 2021-05-28 MED ORDER — PROPOFOL 10 MG/ML IV BOLUS
INTRAVENOUS | Status: DC | PRN
  Administered 2021-05-28: 200 mg via INTRAVENOUS

## 2021-05-28 MED ORDER — HYDROMORPHONE HCL 1 MG/ML IJ SOLN
INTRAMUSCULAR | Status: AC
  Filled 2021-05-28: qty 0.5

## 2021-05-28 MED ORDER — ALBUTEROL SULFATE HFA 108 (90 BASE) MCG/ACT IN AERS
INHALATION_SPRAY | RESPIRATORY_TRACT | Status: DC | PRN
  Administered 2021-05-28 (×2): 2 via RESPIRATORY_TRACT

## 2021-05-28 MED ORDER — FENTANYL CITRATE (PF) 100 MCG/2ML IJ SOLN
INTRAMUSCULAR | Status: DC | PRN
  Administered 2021-05-28 (×2): 50 ug via INTRAVENOUS

## 2021-05-28 MED ORDER — DEXMEDETOMIDINE (PRECEDEX) IN NS 20 MCG/5ML (4 MCG/ML) IV SYRINGE
PREFILLED_SYRINGE | INTRAVENOUS | Status: DC | PRN
  Administered 2021-05-28: 12 ug via INTRAVENOUS
  Administered 2021-05-28: 8 ug via INTRAVENOUS

## 2021-05-28 MED ORDER — LIDOCAINE 2% (20 MG/ML) 5 ML SYRINGE
INTRAMUSCULAR | Status: DC | PRN
  Administered 2021-05-28: 60 mg via INTRAVENOUS

## 2021-05-28 MED ORDER — ACETAMINOPHEN 500 MG PO TABS
1000.0000 mg | ORAL_TABLET | ORAL | Status: AC
  Administered 2021-05-28: 09:00:00 1000 mg via ORAL

## 2021-05-28 MED ORDER — OXYCODONE HCL 5 MG PO TABS
ORAL_TABLET | ORAL | Status: AC
  Filled 2021-05-28: qty 1

## 2021-05-28 MED ORDER — CIPROFLOXACIN IN D5W 400 MG/200ML IV SOLN
INTRAVENOUS | Status: AC
  Filled 2021-05-28: qty 200

## 2021-05-28 MED ORDER — OXYCODONE HCL 5 MG PO TABS: 5.0000 mg | ORAL_TABLET | Freq: Four times a day (QID) | ORAL | 0 refills | Status: DC | PRN

## 2021-05-28 MED ORDER — AMISULPRIDE (ANTIEMETIC) 5 MG/2ML IV SOLN: 10.0000 mg | Freq: Once | INTRAVENOUS | Status: DC | PRN

## 2021-05-28 MED ORDER — DEXAMETHASONE SODIUM PHOSPHATE 4 MG/ML IJ SOLN
INTRAMUSCULAR | Status: DC | PRN
  Administered 2021-05-28: 10 mg via INTRAVENOUS

## 2021-05-28 MED ORDER — OXYCODONE HCL 5 MG PO TABS
5.0000 mg | ORAL_TABLET | Freq: Once | ORAL | Status: AC | PRN
  Administered 2021-05-28: 12:00:00 5 mg via ORAL

## 2021-05-28 MED ORDER — LIDOCAINE 2% (20 MG/ML) 5 ML SYRINGE
INTRAMUSCULAR | Status: AC
  Filled 2021-05-28: qty 5

## 2021-05-28 MED ORDER — PROMETHAZINE HCL 25 MG/ML IJ SOLN: 6.2500 mg | INTRAMUSCULAR | Status: DC | PRN

## 2021-05-28 MED ORDER — DEXAMETHASONE SODIUM PHOSPHATE 10 MG/ML IJ SOLN
INTRAMUSCULAR | Status: AC
  Filled 2021-05-28: qty 1

## 2021-05-28 MED ORDER — LACTATED RINGERS IV SOLN: INTRAVENOUS | Status: DC

## 2021-05-28 MED ORDER — HYDROMORPHONE HCL 1 MG/ML IJ SOLN
0.2500 mg | INTRAMUSCULAR | Status: DC | PRN
  Administered 2021-05-28 (×2): 0.25 mg via INTRAVENOUS

## 2021-05-28 MED ORDER — BUPIVACAINE-EPINEPHRINE 0.5% -1:200000 IJ SOLN
INTRAMUSCULAR | Status: DC | PRN
  Administered 2021-05-28: 20 mL

## 2021-05-28 MED ORDER — MEPERIDINE HCL 25 MG/ML IJ SOLN: 6.2500 mg | INTRAMUSCULAR | Status: DC | PRN

## 2021-05-28 SURGICAL SUPPLY — 45 items
ADH SKN CLS APL DERMABOND .7 (GAUZE/BANDAGES/DRESSINGS) ×1
APL PRP STRL LF DISP 70% ISPRP (MISCELLANEOUS) ×1
APPLIER CLIP 9.375 MED OPEN (MISCELLANEOUS)
APR CLP MED 9.3 20 MLT OPN (MISCELLANEOUS)
BINDER BREAST 3XL (GAUZE/BANDAGES/DRESSINGS) IMPLANT
BINDER BREAST XLRG (GAUZE/BANDAGES/DRESSINGS) IMPLANT
BINDER BREAST XXLRG (GAUZE/BANDAGES/DRESSINGS) IMPLANT
BLADE SURG 15 STRL LF DISP TIS (BLADE) ×1 IMPLANT
BLADE SURG 15 STRL SS (BLADE) ×2
CANISTER SUC SOCK COL 7IN (MISCELLANEOUS) IMPLANT
CANISTER SUCT 1200ML W/VALVE (MISCELLANEOUS) IMPLANT
CHLORAPREP W/TINT 26 (MISCELLANEOUS) ×2 IMPLANT
CLIP APPLIE 9.375 MED OPEN (MISCELLANEOUS) IMPLANT
COVER BACK TABLE 60X90IN (DRAPES) ×2 IMPLANT
COVER MAYO STAND STRL (DRAPES) ×2 IMPLANT
COVER PROBE W GEL 5X96 (DRAPES) ×2 IMPLANT
DERMABOND ADVANCED (GAUZE/BANDAGES/DRESSINGS) ×1
DERMABOND ADVANCED .7 DNX12 (GAUZE/BANDAGES/DRESSINGS) ×1 IMPLANT
DRAPE LAPAROSCOPIC ABDOMINAL (DRAPES) ×2 IMPLANT
DRAPE UTILITY XL STRL (DRAPES) ×2 IMPLANT
ELECT REM PT RETURN 9FT ADLT (ELECTROSURGICAL) ×2
ELECTRODE REM PT RTRN 9FT ADLT (ELECTROSURGICAL) ×1 IMPLANT
GAUZE SPONGE 4X4 12PLY STRL LF (GAUZE/BANDAGES/DRESSINGS) IMPLANT
GLOVE SURG SIGNA 7.5 PF LTX (GLOVE) ×2 IMPLANT
GLOVE SURG UNDER POLY LF SZ7 (GLOVE) ×1 IMPLANT
GOWN STRL REUS W/ TWL LRG LVL3 (GOWN DISPOSABLE) ×1 IMPLANT
GOWN STRL REUS W/ TWL XL LVL3 (GOWN DISPOSABLE) ×1 IMPLANT
GOWN STRL REUS W/TWL LRG LVL3 (GOWN DISPOSABLE) ×2
GOWN STRL REUS W/TWL XL LVL3 (GOWN DISPOSABLE) ×2
KIT MARKER MARGIN INK (KITS) ×2 IMPLANT
NDL HYPO 25X1 1.5 SAFETY (NEEDLE) ×1 IMPLANT
NEEDLE HYPO 25X1 1.5 SAFETY (NEEDLE) ×2 IMPLANT
NS IRRIG 1000ML POUR BTL (IV SOLUTION) IMPLANT
PACK BASIN DAY SURGERY FS (CUSTOM PROCEDURE TRAY) ×2 IMPLANT
PENCIL SMOKE EVACUATOR (MISCELLANEOUS) ×2 IMPLANT
SLEEVE SCD COMPRESS KNEE MED (STOCKING) ×2 IMPLANT
SPONGE T-LAP 4X18 ~~LOC~~+RFID (SPONGE) ×2 IMPLANT
SUT MNCRL AB 4-0 PS2 18 (SUTURE) ×2 IMPLANT
SUT VIC AB 3-0 SH 27 (SUTURE) ×2
SUT VIC AB 3-0 SH 27X BRD (SUTURE) ×1 IMPLANT
SYR CONTROL 10ML LL (SYRINGE) ×2 IMPLANT
TOWEL GREEN STERILE FF (TOWEL DISPOSABLE) ×2 IMPLANT
TRAY FAXITRON CT DISP (TRAY / TRAY PROCEDURE) ×2 IMPLANT
TUBE CONNECTING 20X1/4 (TUBING) IMPLANT
YANKAUER SUCT BULB TIP NO VENT (SUCTIONS) IMPLANT

## 2021-05-28 NOTE — Anesthesia Preprocedure Evaluation (Signed)
Anesthesia Evaluation  Patient identified by MRN, date of birth, ID band Patient awake    Reviewed: Allergy & Precautions, NPO status , Patient's Chart, lab work & pertinent test results  History of Anesthesia Complications Negative for: history of anesthetic complications  Airway Mallampati: II  TM Distance: >3 FB Neck ROM: Full    Dental  (+) Dental Advisory Given, Edentulous Upper   Pulmonary asthma , Current SmokerPatient did not abstain from smoking.,    Pulmonary exam normal        Cardiovascular hypertension, Pt. on medications Normal cardiovascular exam     Neuro/Psych  Headaches, PSYCHIATRIC DISORDERS Depression    GI/Hepatic Neg liver ROS,  Hematemesis, actively nauseous    Endo/Other   Obesity   Renal/GU negative Renal ROS     Musculoskeletal negative musculoskeletal ROS (+)   Abdominal (+) + obese,   Peds  Hematology negative hematology ROS (+)   Anesthesia Other Findings   Reproductive/Obstetrics                             Anesthesia Physical  Anesthesia Plan  ASA: 2  Anesthesia Plan: General   Post-op Pain Management:    Induction: Intravenous  PONV Risk Score and Plan: 2 and Treatment may vary due to age or medical condition, Ondansetron and Midazolam  Airway Management Planned: LMA  Additional Equipment: None  Intra-op Plan:   Post-operative Plan: Extubation in OR  Informed Consent: I have reviewed the patients History and Physical, chart, labs and discussed the procedure including the risks, benefits and alternatives for the proposed anesthesia with the patient or authorized representative who has indicated his/her understanding and acceptance.     Dental advisory given  Plan Discussed with: CRNA and Anesthesiologist  Anesthesia Plan Comments:         Anesthesia Quick Evaluation

## 2021-05-28 NOTE — Transfer of Care (Signed)
Immediate Anesthesia Transfer of Care Note  Patient: Wendy Singh  Procedure(s) Performed: LEFT BREAST LUMPECTOMY WITH RADIOACTIVE SEED LOCALIZATION X2 (Left: Breast)  Patient Location: PACU  Anesthesia Type:General  Level of Consciousness: drowsy  Airway & Oxygen Therapy: Patient Spontanous Breathing and Patient connected to face mask oxygen  Post-op Assessment: Report given to RN and Post -op Vital signs reviewed and stable  Post vital signs: Reviewed and stable  Last Vitals:  Vitals Value Taken Time  BP 123/79 05/28/21 1045  Temp    Pulse 86 05/28/21 1046  Resp 18 05/28/21 1046  SpO2 97 % 05/28/21 1046  Vitals shown include unvalidated device data.  Last Pain:  Vitals:   05/28/21 0847  TempSrc: Oral  PainSc: 6       Patients Stated Pain Goal: 6 (03/07/29 0762)  Complications: No notable events documented.

## 2021-05-28 NOTE — Discharge Instructions (Addendum)
[place] Office Phone Number [phone]  BREAST BIOPSY/ PARTIAL MASTECTOMY: POST OP INSTRUCTIONS  Always review your discharge instruction sheet given to you by the facility where your surgery was performed.  IF YOU HAVE DISABILITY OR FAMILY LEAVE FORMS, YOU MUST BRING THEM TO THE OFFICE FOR PROCESSING.  DO NOT GIVE THEM TO YOUR DOCTOR.  A prescription for pain medication may be given to you upon discharge.  Take your pain medication as prescribed, if needed.  If narcotic pain medicine is not needed, then you may take acetaminophen (Tylenol) or ibuprofen (Advil) as needed. Take your usually prescribed medications unless otherwise directed If you need a refill on your pain medication, please contact your pharmacy.  They will contact our office to request authorization.  Prescriptions will not be filled after 5pm or on week-ends. You should eat very light the first 24 hours after surgery, such as soup, crackers, pudding, etc.  Resume your normal diet the day after surgery. Most patients will experience some swelling and bruising in the breast.  Ice packs and a good support bra will help.  Swelling and bruising can take several days to resolve.  It is common to experience some constipation if taking pain medication after surgery.  Increasing fluid intake and taking a stool softener will usually help or prevent this problem from occurring.  A mild laxative (Milk of Magnesia or Miralax) should be taken according to package directions if there are no bowel movements after 48 hours. Unless discharge instructions indicate otherwise, you may remove your bandages 24-48 hours after surgery, and you may shower at that time.  You may have steri-strips (small skin tapes) in place directly over the incision.  These strips should be left on the skin for 7-10 days.  If your surgeon used skin glue on the incision, you may shower in 24 hours.  The glue will flake off over the next 2-3 weeks.  Any  sutures or staples will be removed at the office during your follow-up visit. ACTIVITIES:  You may resume regular daily activities (gradually increasing) beginning the next day.  Wearing a good support bra or sports bra minimizes pain and swelling.  You may have sexual intercourse when it is comfortable. You may drive when you no longer are taking prescription pain medication, you can comfortably wear a seatbelt, and you can safely maneuver your car and apply brakes. RETURN TO WORK:  ______________________________________________________________________________________ Dennis Bast should see your doctor in the office for a follow-up appointment approximately two weeks after your surgery.  Your doctors nurse will typically make your follow-up appointment when she calls you with your pathology report.  Expect your pathology report 2-3 business days after your surgery.  You may call to check if you do not hear from [place] after three days. OTHER INSTRUCTIONS: OK TO SHOWER STARTING TOMORROW TYLENOL AND ICE PACK ALSO FOR PAIN  NO VIGOROUS ACTIVITY FOR ONE WEEK ______________Next dose of Tylenol 3pm today if needed______________________________________________________________________________ _____________________________________________________________________________________________________________________________________ _____________________________________________________________________________________________________________________________________ _____________________________________________________________________________________________________________________________________  WHEN TO CALL YOUR DOCTOR: Fever over 101.0 Nausea and/or vomiting. Extreme swelling or bruising. Continued bleeding from incision. Increased pain, redness, or drainage from the incision.  The clinic staff is available to answer your questions during regular business hours.  Please dont hesitate to call and ask to speak to one of  the nurses for clinical concerns.  If you have a medical emergency, go to the nearest emergency room or call 911.  A surgeon from [facility] is always on call at the  hospital.  For further questions, please visit [email or web address]     Post Anesthesia Home Care Instructions  Activity: Get plenty of rest for the remainder of the day. A responsible individual must stay with you for 24 hours following the procedure.  For the next 24 hours, DO NOT: -Drive a car -Paediatric nurse -Drink alcoholic beverages -Take any medication unless instructed by your physician -Make any legal decisions or sign important papers.  Meals: Start with liquid foods such as gelatin or soup. Progress to regular foods as tolerated. Avoid greasy, spicy, heavy foods. If nausea and/or vomiting occur, drink only clear liquids until the nausea and/or vomiting subsides. Call your physician if vomiting continues.  Special Instructions/Symptoms: Your throat may feel dry or sore from the anesthesia or the breathing tube placed in your throat during surgery. If this causes discomfort, gargle with warm salt water. The discomfort should disappear within 24 hours.  If you had a scopolamine patch placed behind your ear for the management of post- operative nausea and/or vomiting:  1. The medication in the patch is effective for 72 hours, after which it should be removed.  Wrap patch in a tissue and discard in the trash. Wash hands thoroughly with soap and water. 2. You may remove the patch earlier than 72 hours if you experience unpleasant side effects which may include dry mouth, dizziness or visual disturbances. 3. Avoid touching the patch. Wash your hands with soap and water after contact with the patch.      Oxycodone 5mg  given at 12:20pm today

## 2021-05-28 NOTE — Anesthesia Postprocedure Evaluation (Signed)
Anesthesia Post Note  Patient: Wendy Singh  Procedure(s) Performed: LEFT BREAST LUMPECTOMY WITH RADIOACTIVE SEED LOCALIZATION X2 (Left: Breast)     Patient location during evaluation: PACU Anesthesia Type: General Level of consciousness: awake and alert Pain management: pain level controlled Vital Signs Assessment: post-procedure vital signs reviewed and stable Respiratory status: spontaneous breathing, nonlabored ventilation and respiratory function stable Cardiovascular status: blood pressure returned to baseline and stable Postop Assessment: no apparent nausea or vomiting Anesthetic complications: no   No notable events documented.  Last Vitals:  Vitals:   05/28/21 1200 05/28/21 1223  BP: 108/71 137/73  Pulse: (!) 58 68  Resp: 15 16  Temp:  36.4 C  SpO2: 93% 98%    Last Pain:  Vitals:   05/28/21 1223  TempSrc: Oral  PainSc: 4                  Lynda Rainwater

## 2021-05-28 NOTE — Anesthesia Procedure Notes (Signed)
Procedure Name: LMA Insertion Date/Time: 05/28/2021 10:01 AM Performed by: Ezequiel Kayser, CRNA Pre-anesthesia Checklist: Patient identified, Emergency Drugs available, Suction available and Patient being monitored Patient Re-evaluated:Patient Re-evaluated prior to induction Oxygen Delivery Method: Circle System Utilized Preoxygenation: Pre-oxygenation with 100% oxygen Induction Type: IV induction Ventilation: Mask ventilation without difficulty LMA: LMA inserted LMA Size: 4.0 Number of attempts: 1 Airway Equipment and Method: Bite block Placement Confirmation: positive ETCO2 Tube secured with: Tape Dental Injury: Teeth and Oropharynx as per pre-operative assessment

## 2021-05-28 NOTE — Op Note (Signed)
LEFT BREAST LUMPECTOMY WITH RADIOACTIVE SEED LOCALIZATION X2  Procedure Note  Wendy Singh 05/28/2021   Pre-op Diagnosis: LEFT BREAST MASS     Post-op Diagnosis: same  Procedure(s): LEFT BREAST LUMPECTOMY WITH RADIOACTIVE SEED LOCALIZATION X2  Surgeon(s): Coralie Keens, MD  Anesthesia: General  Staff:  Circulator: Izora Ribas, RN Scrub Person: Lorenza Burton, CST  Estimated Blood Loss: Minimal               Specimens: sent to path  Indications: This is a 45 year old female who was found to have a suspicious area in the upper outer quadrant of the left breast screen mammography.  It measured approximate 3.7 cm.  The more anterior extent of it was biopsied showing fibrocystic changes and dense fibrosis.  Given her strong family history of breast cancer either removal of this area with bracketed seeds or at least biopsying other areas was recommended.  She wished to proceed with a lumpectomy  Procedure: The patient was brought to operating identifies correct patient.  She is placed upon the operating room table and general anesthesia was induced.  Her left breast was prepped and draped in the usual sterile fashion.  With the neoprobe I located the 2 seeds in the lateral deep upper outer quadrant of the breast.  I anesthetized the lateral edge of the left areola with Marcaine.  I then made a circumareolar incision with a scalpel.  I then dissected down into the breast tissue and then laterally with electrocautery.  Using neoprobe I reached the area of the 2 radioactive seeds.  I then performed a wide lumpectomy staying around and underneath the seeds with the electrocautery in 1 specimen.  Again, this was a bracketed lumpectomy.  Once the specimen was removed, I marked all margins with paint.  An x-ray was performed confirming the both radioactive seeds and the previous biopsy clip were in the specimen.  The specimen was then sent to pathology for evaluation.  I achieved  hemostasis with the cautery.  I anesthetized the incision further with Marcaine.  I then closed the subcutaneous tissue with interrupted 3-0 Vicryl sutures and closed the skin with a running 4-0 Monocryl.  Dermabond was then applied.  The patient tolerated the procedure well.  All the counts were correct at the end of the procedure.  The patient was then extubated in the operating room and taken in a stable condition to the recovery room.          Coralie Keens   Date: 05/28/2021  Time: 10:38 AM

## 2021-05-28 NOTE — Interval H&P Note (Signed)
History and Physical Interval Note: no change in H and P  05/28/2021 8:51 AM  Wendy Singh  has presented today for surgery, with the diagnosis of LEFT BREAST MASS.  The various methods of treatment have been discussed with the patient and family. After consideration of risks, benefits and other options for treatment, the patient has consented to  Procedure(s): LEFT BREAST LUMPECTOMY WITH RADIOACTIVE SEED LOCALIZATION X2 (Left) as a surgical intervention.  The patient's history has been reviewed, patient examined, no change in status, stable for surgery.  I have reviewed the patient's chart and labs.  Questions were answered to the patient's satisfaction.     Coralie Keens

## 2021-05-29 ENCOUNTER — Encounter (HOSPITAL_BASED_OUTPATIENT_CLINIC_OR_DEPARTMENT_OTHER): Payer: Self-pay | Admitting: Surgery

## 2021-05-30 LAB — SURGICAL PATHOLOGY

## 2021-06-17 ENCOUNTER — Emergency Department (HOSPITAL_COMMUNITY)
Admission: EM | Admit: 2021-06-17 | Discharge: 2021-06-17 | Disposition: A | Discharge status: Home / Self Care | Payer: No Typology Code available for payment source | Attending: Emergency Medicine | Admitting: Emergency Medicine

## 2021-06-17 ENCOUNTER — Encounter (HOSPITAL_COMMUNITY): Payer: Self-pay | Admitting: *Deleted

## 2021-06-17 ENCOUNTER — Emergency Department (HOSPITAL_COMMUNITY): Payer: No Typology Code available for payment source

## 2021-06-17 DIAGNOSIS — I10 Essential (primary) hypertension: Secondary | ICD-10-CM | POA: Insufficient documentation

## 2021-06-17 DIAGNOSIS — R0981 Nasal congestion: Secondary | ICD-10-CM | POA: Insufficient documentation

## 2021-06-17 DIAGNOSIS — R0602 Shortness of breath: Secondary | ICD-10-CM | POA: Insufficient documentation

## 2021-06-17 DIAGNOSIS — H6121 Impacted cerumen, right ear: Secondary | ICD-10-CM | POA: Insufficient documentation

## 2021-06-17 DIAGNOSIS — J029 Acute pharyngitis, unspecified: Secondary | ICD-10-CM

## 2021-06-17 DIAGNOSIS — J45909 Unspecified asthma, uncomplicated: Secondary | ICD-10-CM | POA: Insufficient documentation

## 2021-06-17 DIAGNOSIS — R079 Chest pain, unspecified: Secondary | ICD-10-CM | POA: Insufficient documentation

## 2021-06-17 DIAGNOSIS — Z79899 Other long term (current) drug therapy: Secondary | ICD-10-CM | POA: Insufficient documentation

## 2021-06-17 DIAGNOSIS — R6889 Other general symptoms and signs: Secondary | ICD-10-CM

## 2021-06-17 DIAGNOSIS — Z7951 Long term (current) use of inhaled steroids: Secondary | ICD-10-CM | POA: Insufficient documentation

## 2021-06-17 DIAGNOSIS — R059 Cough, unspecified: Secondary | ICD-10-CM | POA: Insufficient documentation

## 2021-06-17 DIAGNOSIS — Z20822 Contact with and (suspected) exposure to covid-19: Secondary | ICD-10-CM | POA: Insufficient documentation

## 2021-06-17 DIAGNOSIS — R051 Acute cough: Secondary | ICD-10-CM

## 2021-06-17 DIAGNOSIS — R110 Nausea: Secondary | ICD-10-CM

## 2021-06-17 LAB — RESP PANEL BY RT-PCR (FLU A&B, COVID) ARPGX2
Influenza A by PCR: NEGATIVE
Influenza B by PCR: NEGATIVE
SARS Coronavirus 2 by RT PCR: NEGATIVE

## 2021-06-17 MED ORDER — BENZONATATE 100 MG PO CAPS: 100.0000 mg | ORAL_CAPSULE | Freq: Three times a day (TID) | ORAL | 0 refills | Status: DC

## 2021-06-17 MED ORDER — ONDANSETRON 4 MG PO TBDP
4.0000 mg | ORAL_TABLET | Freq: Once | ORAL | Status: AC
  Administered 2021-06-17: 4 mg via ORAL
  Filled 2021-06-17: qty 1

## 2021-06-17 MED ORDER — BENZONATATE 100 MG PO CAPS
200.0000 mg | ORAL_CAPSULE | Freq: Once | ORAL | Status: AC
  Administered 2021-06-17: 200 mg via ORAL
  Filled 2021-06-17: qty 2

## 2021-06-17 MED ORDER — ONDANSETRON 4 MG PO TBDP: 4.0000 mg | ORAL_TABLET | Freq: Three times a day (TID) | ORAL | 0 refills | Status: DC | PRN

## 2021-06-17 MED ORDER — FLUTICASONE PROPIONATE 50 MCG/ACT NA SUSP: 2.0000 | Freq: Every day | NASAL | 0 refills | Status: DC

## 2021-06-17 NOTE — Discharge Instructions (Addendum)
I have given you a prescription for fluticasone nasal spray which help with the nasal congestion and ear pain.  Zofran for nausea.  Tessalon Perles for cough.  The symptoms will resolve on its own.  You did test negative for COVID and flu like we discussed.  This is still likely a viral illness.  Please return the emergency department if you experience worsening symptoms, trouble breathing, chest pain that is worsening, fever that will not resolve, or any other concerns you have.

## 2021-06-17 NOTE — ED Triage Notes (Signed)
Cough with fever x 2 weeks, states is has been controlled with Tylenol, productive cough with blood in sputum

## 2021-06-17 NOTE — ED Provider Notes (Signed)
[facility] Provider Note   CSN: [id number] Arrival date & time: 06/17/21  1125     History  No chief complaint on file.   Wendy Singh is a 46 y.o. female with history of asthma and hypertension who presents to the emergency department with a 2-day history of general flulike illness.  She has been having nausea, cough, chest tightness, intermittent fever that is improved with Tylenol, sore throat, ear pain, and nasal congestion.  No chest pain, vomiting, or diarrhea.  No urinary complaints.  HPI     Home Medications Prior to Admission medications   Medication Sig Start Date End Date Taking? Authorizing Provider  benzonatate (TESSALON) 100 MG capsule Take 1 capsule (100 mg total) by mouth every 8 (eight) hours. 06/17/21  Yes Cayce Paschal M, PA-C  fluticasone (FLONASE) 50 MCG/ACT nasal spray Place 2 sprays into both nostrils daily. 06/17/21  Yes Raul Del, Jenay Morici M, PA-C  ondansetron (ZOFRAN-ODT) 4 MG disintegrating tablet Take 1 tablet (4 mg total) by mouth every 8 (eight) hours as needed for nausea or vomiting. 06/17/21  Yes Dorrine Montone M, PA-C  ondansetron (ZOFRAN) 4 MG tablet Take 1 tablet (4 mg total) by mouth every 6 (six) hours. 01/29/21   Sherrill Raring, PA-C  oxyCODONE (OXY IR/ROXICODONE) 5 MG immediate release tablet Take 1 tablet (5 mg total) by mouth every 6 (six) hours as needed for moderate pain, severe pain or breakthrough pain. 05/28/21   Coralie Keens, MD  pantoprazole (PROTONIX) 20 MG tablet Take 1 tablet (20 mg total) by mouth daily. 01/29/21   Sherrill Raring, PA-C  PROVENTIL HFA 108 (90 Base) MCG/ACT inhaler INHALE 2 PUFFS BY MOUTH EVERY 6 HOURS AS NEEDED FOR WHEEZING OR SHORTNESS OF BREATH Patient taking differently: Inhale 2 puffs into the lungs every 6 (six) hours as needed. 10/17/19   Soyla Dryer, PA-C      Allergies    Penicillins    Review of Systems   Review of Systems  All other systems reviewed and are negative.  Physical  Exam Updated Vital Signs BP 120/60 (BP Location: Right Arm)    Pulse 77    Temp 98.4 F (36.9 C) (Oral)    Resp 16    LMP 05/26/2021    SpO2 98%  Physical Exam Vitals and nursing note reviewed.  Constitutional:      General: She is not in acute distress.    Appearance: Normal appearance.  HENT:     Head: Normocephalic and atraumatic.     Right Ear: Ear canal and external ear normal. There is impacted cerumen.     Left Ear: Tympanic membrane, ear canal and external ear normal.     Mouth/Throat:     Mouth: Mucous membranes are moist.     Tongue: No lesions. Tongue does not deviate from midline.     Pharynx: Uvula midline. Posterior oropharyngeal erythema present. No oropharyngeal exudate or uvula swelling.  Eyes:     General:        Right eye: No discharge.        Left eye: No discharge.  Cardiovascular:     Comments: Regular rate and rhythm.  S1/S2 are distinct without any evidence of murmur, rubs, or gallops.  Radial pulses are 2+ bilaterally.  Dorsalis pedis pulses are 2+ bilaterally.  No evidence of pedal edema. Pulmonary:     Comments: Clear to auscultation bilaterally.  Normal effort.  No respiratory distress.  No evidence of wheezes, rales, or rhonchi heard  throughout. Abdominal:     General: Abdomen is flat. Bowel sounds are normal. There is no distension.     Tenderness: There is no abdominal tenderness. There is no guarding or rebound.  Musculoskeletal:        General: Normal range of motion.     Cervical back: Neck supple.  Skin:    General: Skin is warm and dry.     Findings: No rash.  Neurological:     General: No focal deficit present.     Mental Status: She is alert.  Psychiatric:        Mood and Affect: Mood normal.        Behavior: Behavior normal.    ED Results / Procedures / Treatments   Labs (all labs ordered are listed, but only abnormal results are displayed) Labs Reviewed  RESP PANEL BY RT-PCR (FLU A&B, COVID) [id number]    EKG EKG  Interpretation  Date/Time:  Tuesday June 17 2021 12:33:58 EST Ventricular Rate:  82 PR Interval:  138 QRS Duration: 88 QT Interval:  388 QTC Calculation: 453 R Axis:   21 Text Interpretation: Sinus rhythm with Premature supraventricular complexes Cannot rule out Anterior infarct , age undetermined Abnormal ECG When compared with ECG of 15-Dec-2018 08:45, No significant change since Confirmed by Aletta Edouard [id number]) on 06/17/2021 12:53:16 PM  Radiology DG Chest Port 1 View  Result Date: 06/17/2021 CLINICAL DATA:  Cough, fever for 2 weeks EXAM: PORTABLE CHEST 1 VIEW COMPARISON:  Chest radiograph 09/07/2018 FINDINGS: The cardiomediastinal silhouette is normal. There is no focal consolidation or pulmonary edema. There is no pleural effusion or pneumothorax. There is no acute osseous abnormality. IMPRESSION: No radiographic evidence of acute cardiopulmonary process. Electronically Signed   By: Valetta Mole M.D.   On: 06/17/2021 12:58    Procedures Procedures     Medications Ordered in ED Medications  benzonatate (TESSALON) capsule 200 mg (has no administration in time range)  ondansetron (ZOFRAN-ODT) disintegrating tablet 4 mg (4 mg Oral Given 06/17/21 1350)    ED Course/ Medical Decision Making/ A&P                           Medical Decision Making  This patient presents to the ED for concern of flu like illness, this involves an extensive number of treatment options, and is a complaint that carries with it a high risk of complications and morbidity.  The differential diagnosis includes viral syndrome, pneumonia, strep pharyngitis.    Co morbidities that complicate the patient evaluation  Asthma, HTN  Lab Tests:  I Ordered, and personally interpreted labs.  The pertinent results include:  chest xray which was negative for any pneumonia, pneumothorax, COPD, or viral bronchitis.   Imaging Studies ordered: Chest Xray   I ordered imaging studies including  I independently  visualized and interpreted imaging which was negative for any pneumonia, pneumothorax, COPD, or viral bronchitis.  I agree with the radiologist interpretation  Medicines ordered and prescription drug management:  I ordered medication including Zofran ODT  for nausea Reevaluation of the patient after these medicines showed that the patient improved I have reviewed the patients home medicines and have made adjustments as needed  Problem List / ED Course:  Patient having what seems to be viral process.  She has a lot of upper respiratory symptoms.  Ears do not appear to have evidence of otitis media.  Although it is difficult to see the right TM secondary  to cerumen.  Overall, I think strep pharyngitis is low.  She has no tonsillar exudate and has cough present. COVID was negative at home.  Zofran for nausea.  I will reassess.  She is in no acute distress at this time.  Vital signs are stable apart from some mild hypertension.   Reevaluation:  After the interventions noted above, I reevaluated the patient and found that they have :stayed the same   Dispostion:  After consideration of the diagnostic results and the patients response to treatment, I feel that the patent would benefit from outpatient follow-up.  I discussed at length with the patient at bedside regarding over-the-counter remedies for her symptoms.  This includes: Fluticasone nasal sprays for nasal congestion and ear pain, DayQuil/NyQuil for aches/pains/fever, Zofran for nausea, Tessalon Perles for cough.  Patient does not have primary care follow-up.  I am confident that this will resolve on its own.  In the event that it does not, I expressed the patient is more than happy to come back to the emergency room for further evaluation.  She is safe for discharge.  Final Clinical Impression(s) / ED Diagnoses Final diagnoses:  Acute cough  Nausea  Sore throat    Rx / DC Orders ED Discharge Orders          Ordered     ondansetron (ZOFRAN-ODT) 4 MG disintegrating tablet  Every 8 hours PRN        06/17/21 1413    benzonatate (TESSALON) 100 MG capsule  Every 8 hours        06/17/21 1413    fluticasone (FLONASE) 50 MCG/ACT nasal spray  Daily        06/17/21 1413              Myna Bright [place], [place] 06/17/21 1426    Hayden Rasmussen, MD 06/17/21 1939

## 2021-08-13 ENCOUNTER — Emergency Department (HOSPITAL_COMMUNITY): Payer: Self-pay

## 2021-08-13 ENCOUNTER — Emergency Department (HOSPITAL_COMMUNITY)
Admission: EM | Admit: 2021-08-13 | Discharge: 2021-08-13 | Disposition: A | Discharge status: Home / Self Care | Payer: Self-pay | Attending: Emergency Medicine | Admitting: Emergency Medicine

## 2021-08-13 ENCOUNTER — Other Ambulatory Visit: Payer: Self-pay

## 2021-08-13 ENCOUNTER — Encounter (HOSPITAL_COMMUNITY): Payer: Self-pay | Admitting: Emergency Medicine

## 2021-08-13 DIAGNOSIS — R42 Dizziness and giddiness: Secondary | ICD-10-CM | POA: Insufficient documentation

## 2021-08-13 DIAGNOSIS — H6091 Unspecified otitis externa, right ear: Secondary | ICD-10-CM | POA: Insufficient documentation

## 2021-08-13 DIAGNOSIS — H60501 Unspecified acute noninfective otitis externa, right ear: Secondary | ICD-10-CM

## 2021-08-13 LAB — I-STAT CHEM 8, ED
BUN: 11 mg/dL (ref 6–20)
Calcium, Ion: 1.4 mmol/L (ref 1.15–1.40)
Chloride: 105 mmol/L (ref 98–111)
Creatinine, Ser: 0.9 mg/dL (ref 0.44–1.00)
Glucose, Bld: 87 mg/dL (ref 70–99)
HCT: 42 % (ref 36.0–46.0)
Hemoglobin: 14.3 g/dL (ref 12.0–15.0)
Potassium: 4.1 mmol/L (ref 3.5–5.1)
Sodium: 139 mmol/L (ref 135–145)
TCO2: 27 mmol/L (ref 22–32)

## 2021-08-13 LAB — I-STAT BETA HCG BLOOD, ED (MC, WL, AP ONLY): I-stat hCG, quantitative: <5 m[IU]/mL (ref <5)

## 2021-08-13 MED ORDER — OFLOXACIN 0.3 % OT SOLN
5.0000 [drp] | Freq: Two times a day (BID) | OTIC | 0 refills | Status: AC
Start: 2021-08-13 — End: 2021-08-20

## 2021-08-13 MED ORDER — ONDANSETRON HCL 4 MG/2ML IJ SOLN
4.0000 mg | Freq: Once | INTRAMUSCULAR | Status: AC
  Administered 2021-08-13: 4 mg via INTRAVENOUS
  Filled 2021-08-13: qty 2

## 2021-08-13 MED ORDER — IOHEXOL 300 MG/ML  SOLN
100.0000 mL | Freq: Once | INTRAMUSCULAR | Status: AC | PRN
  Administered 2021-08-13: 75 mL via INTRAVENOUS

## 2021-08-13 MED ORDER — HYDROMORPHONE HCL 1 MG/ML IJ SOLN
0.5000 mg | Freq: Once | INTRAMUSCULAR | Status: AC
  Administered 2021-08-13: 0.5 mg via INTRAVENOUS
  Filled 2021-08-13: qty 1

## 2021-08-13 NOTE — Discharge Instructions (Signed)
I have started you on antibiotics please take as prescribed, I recommend over-the-counter pain medication as needed. ? ?If you develop worsening right-sided neck pain had severe dizziness nausea vomiting fevers and chills despite using antibiotics please come back in for reevaluation ? ?Please follow-up with ENT for further evaluation ? ? ?Come back to the emergency department if you develop chest pain, shortness of breath, severe abdominal pain, uncontrolled nausea, vomiting, diarrhea. ? ?

## 2021-08-13 NOTE — ED Triage Notes (Signed)
Right ear pain x 3 days, denies fever.  ?

## 2021-08-13 NOTE — ED Provider Notes (Signed)
[facility] EMERGENCY DEPARTMENT Provider Note   CSN: [id number] Arrival date & time: 08/13/21  1436     History  Chief Complaint  Patient presents with   Dizziness   Otalgia    Wendy Singh is a 46 y.o. female.  HPI  Patient out significant medical history presents with complaints of right ear pain.  Patient states ear pain started 3 days ago, came on suddenly, states that it was slightly intermittent but now has become constant and is worsening pain, pain is in her ear but now she started to feel behind her and rating down her right neck, she states that she has decrease in hearing, no drainage or discharge in the area, denies any trauma, she states that she tried placing cotton ball as well as Sweet water in her ear which only made it worse.  She states that she felt slightly dizzy but denies any headaches no change in vision no paresthesia or weakness of her lower extremities, she currently has no dizziness at this time.  She is never had this in the past.  She has no other complaints.  No URI-like symptoms no fevers no chills no cough or congestion.  Home Medications Prior to Admission medications   Medication Sig Start Date End Date Taking? Authorizing Provider  ofloxacin (FLOXIN) 0.3 % OTIC solution Place 5 drops into the right ear 2 (two) times daily for 7 days. 08/13/21 08/20/21 Yes Marcello Fennel, PA-C  benzonatate (TESSALON) 100 MG capsule Take 1 capsule (100 mg total) by mouth every 8 (eight) hours. 06/17/21   Myna Bright M, PA-C  fluticasone (FLONASE) 50 MCG/ACT nasal spray Place 2 sprays into both nostrils daily. 06/17/21   Myna Bright M, PA-C  ondansetron (ZOFRAN) 4 MG tablet Take 1 tablet (4 mg total) by mouth every 6 (six) hours. 01/29/21   Sherrill Raring, PA-C  ondansetron (ZOFRAN-ODT) 4 MG disintegrating tablet Take 1 tablet (4 mg total) by mouth every 8 (eight) hours as needed for nausea or vomiting. 06/17/21   Hendricks Limes, PA-C  oxyCODONE (OXY  IR/ROXICODONE) 5 MG immediate release tablet Take 1 tablet (5 mg total) by mouth every 6 (six) hours as needed for moderate pain, severe pain or breakthrough pain. 05/28/21   Coralie Keens, MD  pantoprazole (PROTONIX) 20 MG tablet Take 1 tablet (20 mg total) by mouth daily. 01/29/21   Sherrill Raring, PA-C  PROVENTIL HFA 108 (90 Base) MCG/ACT inhaler INHALE 2 PUFFS BY MOUTH EVERY 6 HOURS AS NEEDED FOR WHEEZING OR SHORTNESS OF BREATH Patient taking differently: Inhale 2 puffs into the lungs every 6 (six) hours as needed. 10/17/19   Soyla Dryer, PA-C      Allergies    Penicillins    Review of Systems   Review of Systems  Constitutional:  Negative for chills and fever.  HENT:  Positive for ear pain and hearing loss. Negative for ear discharge and sinus pain.   Respiratory:  Negative for shortness of breath.   Cardiovascular:  Negative for chest pain.  Gastrointestinal:  Negative for abdominal pain.  Neurological:  Negative for headaches.   Physical Exam Updated Vital Signs BP 113/65 (BP Location: Right Arm)    Pulse 67    Resp 19    Ht 5\' 11"  (1.803 m)    Wt 102.5 kg    SpO2 100%    BMI 31.52 kg/m  Physical Exam Vitals and nursing note reviewed.  Constitutional:      General: She is not  in acute distress.    Appearance: She is not ill-appearing.  HENT:     Head: Normocephalic and atraumatic.     Right Ear: External ear normal. There is impacted cerumen.     Left Ear: Tympanic membrane, ear canal and external ear normal.     Ears:     Comments: No noted ear protrusion, no overlying skin changes behind the mastoids, she was tender behind her right mastoid, there is no noted drainage from her right ear, right ear canal had slight erythema with cerumen impaction, unable to remove all cerumen, difficult to fully visualize the TM but it appeared to be intact, slightly bulging, without erythema.    Nose: No congestion.  Eyes:     Conjunctiva/sclera: Conjunctivae normal.  Cardiovascular:      Rate and Rhythm: Normal rate and regular rhythm.     Pulses: Normal pulses.     Heart sounds: No murmur heard.   No friction rub. No gallop.  Pulmonary:     Effort: No respiratory distress.     Breath sounds: No wheezing, rhonchi or rales.  Skin:    General: Skin is warm and dry.  Neurological:     Mental Status: She is alert.  Psychiatric:        Mood and Affect: Mood normal.    ED Results / Procedures / Treatments   Labs (all labs ordered are listed, but only abnormal results are displayed) Labs Reviewed  I-STAT BETA HCG BLOOD, ED ([facility], [facility], [facility] ONLY)  I-STAT CHEM 8, ED    EKG None  Radiology CT Temporal Bones W Contrast  Result Date: 08/13/2021 CLINICAL DATA:  Right ear pain.  Rule out mastoiditis. EXAM: CT TEMPORAL BONES WITH CONTRAST TECHNIQUE: Axial and coronal plane CT imaging of the petrous temporal bones was performed with thin-collimation image reconstruction following intravenous contrast administration. Multiplanar CT image reconstructions were also generated. RADIATION DOSE REDUCTION: This exam was performed according to the departmental dose-optimization program which includes automated exposure control, adjustment of the mA and/or kV according to patient size and/or use of iterative reconstruction technique. CONTRAST:  65mL OMNIPAQUE IOHEXOL 300 MG/ML  SOLN COMPARISON:  CT head 09/18/2012 FINDINGS: RIGHT TEMPORAL BONE External auditory canal: Mild skin thickening of the external canal which is nonocclusive. No bony erosion. Middle ear cavity: Well aerated middle ear. [place] normal. Tympanic membrane normal. Negative for cholesteatoma. Inner ear structures: Cochlea and semi circular canals normal. Internal auditory and facial nerve canals:  Normal Mastoid air cells: Well developed and clear. LEFT TEMPORAL BONE External auditory canal: Normal Middle ear cavity: Middle ear well aerated. [place] normal. Tympanic membrane normal. Inner ear structures: Normal cochlea and  semi circular canals. Internal auditory and facial nerve canals:  Normal Mastoid air cells: Well developed and clear. Vascular: Internal carotid artery normal in position. Normal jugular foramen bilaterally. Limited intracranial:  Negative Visible orbits/paranasal sinuses: Negative orbit. Mild mucosal edema paranasal sinuses. Soft tissues: No soft tissue swelling. IMPRESSION: 1. Mild thickening of the external auditory canal on the right. This could be due to infection or less likely tumor or cholesteatoma. Recommend physical exam of the external auditory canal on the right. 2. Right middle ear and mastoid sinus clear. 3. Normal left temporal bone. Electronically Signed   By: Franchot Gallo M.D.   On: 08/13/2021 18:20    Procedures Procedures    Medications Ordered in ED Medications  HYDROmorphone (DILAUDID) injection 0.5 mg (0.5 mg Intravenous Given 08/13/21 1653)  ondansetron (ZOFRAN) injection 4  mg (4 mg Intravenous Given 08/13/21 1651)  iohexol (OMNIPAQUE) 300 MG/ML solution 100 mL (75 mLs Intravenous Contrast Given 08/13/21 1727)    ED Course/ Medical Decision Making/ A&P                           Medical Decision Making Amount and/or Complexity of Data Reviewed Labs: ordered. Radiology: ordered.  Risk Prescription drug management.   This patient presents to the ED for concern of ear pain, this involves an extensive number of treatment options, and is a complaint that carries with it a high risk of complications and morbidity.  The differential diagnosis includes otitis externa, otitis media, mastoiditis    Additional history obtained:  Additional history obtained from N/A    Co morbidities that complicate the patient evaluation  N/A  Social Determinants of Health:  N/A    Lab Tests:  I Ordered, and personally interpreted labs.  The pertinent results include:     Imaging Studies ordered:  I ordered imaging studies including CT temporal bones I independently  visualized and interpreted imaging which showed shows thickening of the external auditory canal of the right ear I agree with the radiologist interpretation   Cardiac Monitoring:  The patient was maintained on a cardiac monitor.  I personally viewed and interpreted the cardiac monitored which showed an underlying rhythm of: N/A   Medicines ordered and prescription drug management:  I ordered medication including Dilaudid for pain medication I have reviewed the patients home medicines and have made adjustments as needed   Reevaluation:  Presenting with right ear pain patient had noted cerumen impaction, able to flush some of the cerumen out but still had difficulty fully evaluating the ear, she is extremely tender on her right mastoid I am concerned for possible mastoiditis, will provide patient with pain medication and obtain CT imaging for further evaluation.  Patient is reassessed she is found resting comfortably has no complaints updated on lab or imaging she is agreeable for discharge.  Rule out I have low suspicion for mastoiditis as CT imaging using for this finding.  I have low suspicion for otitis media she has no erythema, TM has a possible slight bulge but landmarks are still identifiable there is no erythema noted on the TM itself this also correlates with CT findings.  I have low suspicion for cholesteatoma no growth of skin noted within the ear canal or behind the TM   Dispostion and problem list  After consideration of the diagnostic results and the patients response to treatment, I feel that the patent would benefit from discharge.  Otitis externa- will start her on antibiotics, recommend over-the-counter pain occasions, have her follow-up with the ENT gave her strict return precautions.            Final Clinical Impression(s) / ED Diagnoses Final diagnoses:  Acute otitis externa of right ear, unspecified type    Rx / DC Orders ED Discharge Orders           Ordered    ofloxacin (FLOXIN) 0.3 % OTIC solution  2 times daily        08/13/21 1839              Aron Baba 08/13/21 1841    Hayden Rasmussen, MD 08/14/21 [phone]

## 2021-08-20 ENCOUNTER — Encounter (HOSPITAL_COMMUNITY): Payer: Self-pay

## 2021-12-03 ENCOUNTER — Emergency Department (HOSPITAL_COMMUNITY): Payer: Self-pay

## 2021-12-03 ENCOUNTER — Encounter (HOSPITAL_COMMUNITY): Payer: Self-pay | Admitting: Emergency Medicine

## 2021-12-03 ENCOUNTER — Other Ambulatory Visit: Payer: Self-pay

## 2021-12-03 ENCOUNTER — Emergency Department (HOSPITAL_COMMUNITY)
Admission: EM | Admit: 2021-12-03 | Discharge: 2021-12-03 | Disposition: A | Discharge status: Home / Self Care | Payer: Self-pay | Attending: Emergency Medicine | Admitting: Emergency Medicine

## 2021-12-03 DIAGNOSIS — I10 Essential (primary) hypertension: Secondary | ICD-10-CM | POA: Insufficient documentation

## 2021-12-03 DIAGNOSIS — M545 Low back pain, unspecified: Secondary | ICD-10-CM | POA: Insufficient documentation

## 2021-12-03 DIAGNOSIS — J45909 Unspecified asthma, uncomplicated: Secondary | ICD-10-CM | POA: Insufficient documentation

## 2021-12-03 DIAGNOSIS — R109 Unspecified abdominal pain: Secondary | ICD-10-CM | POA: Insufficient documentation

## 2021-12-03 LAB — CBC WITH DIFFERENTIAL/PLATELET
Abs Immature Granulocytes: 0.04 10*3/uL (ref 0.00–0.07)
Basophils Absolute: 0.1 10*3/uL (ref 0.0–0.1)
Basophils Relative: 0 %
Eosinophils Absolute: 0.2 10*3/uL (ref 0.0–0.5)
Eosinophils Relative: 2 %
HCT: 41.4 % (ref 36.0–46.0)
Hemoglobin: 14.4 g/dL (ref 12.0–15.0)
Immature Granulocytes: 0 %
Lymphocytes Relative: 26 %
Lymphs Abs: 3 10*3/uL (ref 0.7–4.0)
MCH: 31.8 pg (ref 26.0–34.0)
MCHC: 34.8 g/dL (ref 30.0–36.0)
MCV: 91.4 fL (ref 80.0–100.0)
Monocytes Absolute: 1.2 10*3/uL — ABNORMAL HIGH (ref 0.1–1.0)
Monocytes Relative: 10 %
Neutro Abs: 6.9 10*3/uL (ref 1.7–7.7)
Neutrophils Relative %: 62 %
Platelets: 248 10*3/uL (ref 150–400)
RBC: 4.53 MIL/uL (ref 3.87–5.11)
RDW: 12.8 % (ref 11.5–15.5)
WBC: 11.4 10*3/uL — ABNORMAL HIGH (ref 4.0–10.5)
nRBC: 0 % (ref 0.0–0.2)

## 2021-12-03 LAB — URINALYSIS, ROUTINE W REFLEX MICROSCOPIC
Bacteria, UA: NONE SEEN
Bilirubin Urine: NEGATIVE
Glucose, UA: NEGATIVE mg/dL
Ketones, ur: NEGATIVE mg/dL
Leukocytes,Ua: NEGATIVE
Nitrite: NEGATIVE
Protein, ur: NEGATIVE mg/dL
Specific Gravity, Urine: 1.035 — ABNORMAL HIGH (ref 1.005–1.030)
pH: 5 (ref 5.0–8.0)

## 2021-12-03 LAB — COMPREHENSIVE METABOLIC PANEL
ALT: 21 U/L (ref 0–44)
AST: 16 U/L (ref 15–41)
Albumin: 3.8 g/dL (ref 3.5–5.0)
Alkaline Phosphatase: 75 U/L (ref 38–126)
Anion gap: 7 (ref 5–15)
BUN: 15 mg/dL (ref 6–20)
CO2: 21 mmol/L — ABNORMAL LOW (ref 22–32)
Calcium: 9.7 mg/dL (ref 8.9–10.3)
Chloride: 106 mmol/L (ref 98–111)
Creatinine, Ser: 0.82 mg/dL (ref 0.44–1.00)
GFR, Estimated: >60 mL/min (ref >60)
Glucose, Bld: 103 mg/dL — ABNORMAL HIGH (ref 70–99)
Potassium: 3.6 mmol/L (ref 3.5–5.1)
Sodium: 134 mmol/L — ABNORMAL LOW (ref 135–145)
Total Bilirubin: 0.4 mg/dL (ref 0.3–1.2)
Total Protein: 7.2 g/dL (ref 6.5–8.1)

## 2021-12-03 LAB — PREGNANCY, URINE: Preg Test, Ur: NEGATIVE

## 2021-12-03 MED ORDER — LACTATED RINGERS IV BOLUS
1000.0000 mL | Freq: Once | INTRAVENOUS | Status: AC
  Administered 2021-12-03: 1000 mL via INTRAVENOUS

## 2021-12-03 MED ORDER — LIDOCAINE 5 % EX PTCH: 1.0000 | MEDICATED_PATCH | CUTANEOUS | 0 refills | Status: DC

## 2021-12-03 MED ORDER — PREDNISONE 10 MG (21) PO TBPK: ORAL_TABLET | Freq: Every day | ORAL | 0 refills | Status: DC

## 2021-12-03 MED ORDER — ONDANSETRON HCL 4 MG/2ML IJ SOLN
4.0000 mg | Freq: Once | INTRAMUSCULAR | Status: AC
  Administered 2021-12-03: 4 mg via INTRAVENOUS
  Filled 2021-12-03: qty 2

## 2021-12-03 MED ORDER — MORPHINE SULFATE (PF) 4 MG/ML IV SOLN
4.0000 mg | Freq: Once | INTRAVENOUS | Status: AC
  Administered 2021-12-03: 4 mg via INTRAVENOUS
  Filled 2021-12-03: qty 1

## 2021-12-03 NOTE — ED Triage Notes (Signed)
Pt c/o right lower back radiating into right flank and in left groin x 3 days . No changes in gu. Denies vag dc.

## 2021-12-03 NOTE — Discharge Instructions (Addendum)
Your work-up today was reassuring.  CT scan did not show any kidney stones, or other intra-abdominal concerns.  It did show disc base narrowing between the L5-S1 vertebra.  You are likely having a flareup of your chronic low back pain.  Will Prescribe prednisone Dosepak for anti-inflammation.  Have also given you lidocaine patch to use as needed.  Have also given you follow-up information for [facility] to establish primary care with.  If you have worsening symptoms you can return for evaluation.

## 2021-12-03 NOTE — ED Provider Notes (Cosign Needed)
[facility] EMERGENCY DEPARTMENT Provider Note   CSN: [id number] Arrival date & time: 12/03/21  1508     History  Chief Complaint  Patient presents with   Back Pain    ASHEY TRAMONTANA is a 46 y.o. female.  46 year old female with past medical history of kidney stones, chronic low back pain presents today for evaluation of right flank pain that radiates to right hip.  Without associated dysuria, vaginal discharge, fever, chills, nausea, or vomiting.  Ongoing for about 3 days and significantly worsened today.  Has tried over-the-counter medications without any relief.  The history is provided by the patient. No language interpreter was used.       Home Medications Prior to Admission medications   Medication Sig Start Date End Date Taking? Authorizing Provider  benzonatate (TESSALON) 100 MG capsule Take 1 capsule (100 mg total) by mouth every 8 (eight) hours. 06/17/21   Myna Bright M, PA-C  fluticasone (FLONASE) 50 MCG/ACT nasal spray Place 2 sprays into both nostrils daily. 06/17/21   Myna Bright M, PA-C  ondansetron (ZOFRAN) 4 MG tablet Take 1 tablet (4 mg total) by mouth every 6 (six) hours. 01/29/21   Sherrill Raring, PA-C  ondansetron (ZOFRAN-ODT) 4 MG disintegrating tablet Take 1 tablet (4 mg total) by mouth every 8 (eight) hours as needed for nausea or vomiting. 06/17/21   Hendricks Limes, PA-C  oxyCODONE (OXY IR/ROXICODONE) 5 MG immediate release tablet Take 1 tablet (5 mg total) by mouth every 6 (six) hours as needed for moderate pain, severe pain or breakthrough pain. 05/28/21   Coralie Keens, MD  pantoprazole (PROTONIX) 20 MG tablet Take 1 tablet (20 mg total) by mouth daily. 01/29/21   Sherrill Raring, PA-C  PROVENTIL HFA 108 (90 Base) MCG/ACT inhaler INHALE 2 PUFFS BY MOUTH EVERY 6 HOURS AS NEEDED FOR WHEEZING OR SHORTNESS OF BREATH Patient taking differently: Inhale 2 puffs into the lungs every 6 (six) hours as needed. 10/17/19   Soyla Dryer, PA-C      Allergies     Penicillins    Review of Systems   Review of Systems  Constitutional:  Negative for chills and fever.  Gastrointestinal:  Negative for abdominal pain, nausea and vomiting.  Genitourinary:  Positive for flank pain. Negative for dysuria and vaginal discharge.  Neurological:  Negative for light-headedness.  All other systems reviewed and are negative.   Physical Exam Updated Vital Signs BP 134/63 (BP Location: Right Arm)   Pulse 75   Temp (!) 97.5 F (36.4 C) (Oral)   Resp 17   LMP 11/25/2021 (Approximate)   SpO2 95%  Physical Exam Vitals and nursing note reviewed.  Constitutional:      General: She is not in acute distress.    Appearance: Normal appearance. She is not ill-appearing.  HENT:     Head: Normocephalic and atraumatic.     Nose: Nose normal.  Eyes:     General: No scleral icterus.    Extraocular Movements: Extraocular movements intact.     Conjunctiva/sclera: Conjunctivae normal.  Cardiovascular:     Rate and Rhythm: Normal rate and regular rhythm.     Pulses: Normal pulses.  Pulmonary:     Effort: Pulmonary effort is normal. No respiratory distress.     Breath sounds: Normal breath sounds. No wheezing or rales.  Abdominal:     General: There is no distension.     Tenderness: There is no abdominal tenderness.  Musculoskeletal:        General:  Normal range of motion.     Cervical back: Normal range of motion.  Skin:    General: Skin is warm and dry.  Neurological:     General: No focal deficit present.     Mental Status: She is alert. Mental status is at baseline.     ED Results / Procedures / Treatments   Labs (all labs ordered are listed, but only abnormal results are displayed) Labs Reviewed  URINALYSIS, ROUTINE W REFLEX MICROSCOPIC  CBC WITH DIFFERENTIAL/PLATELET  COMPREHENSIVE METABOLIC PANEL  PREGNANCY, URINE    EKG None  Radiology No results found.  Procedures Procedures    Medications Ordered in ED Medications  lactated  ringers bolus 1,000 mL (has no administration in time range)  morphine (PF) 4 MG/ML injection 4 mg (has no administration in time range)  ondansetron (ZOFRAN) injection 4 mg (has no administration in time range)    ED Course/ Medical Decision Making/ A&P                           Medical Decision Making Amount and/or Complexity of Data Reviewed Labs: ordered. Radiology: ordered.  Risk Prescription drug management.   Medical Decision Making / ED Course   This patient presents to the ED for concern of right low back pain, this involves an extensive number of treatment options, and is a complaint that carries with it a high risk of complications and morbidity.  The differential diagnosis includes nephrolithiasis, pyelonephritis, muscle strain, sciatica, appendicitis, UTI, cholecystitis  MDM: 46 year old female presents today for evaluation of right low back pain.  She reports a history of chronic low back pain, as well as kidney stones.  Denies dysuria, hematuria, vaginal discharge, abdominal pain, nausea, vomiting, or fever.  She is well-appearing on exam.  CBC with mild leukocytosis without left shift.  CMP without acute concerns.  UA without evidence of UTI.  CT renal stone study without evidence of kidney stones, or other acute concerns.  It does demonstrate L5-S1 disc space narrowing and endplate osteophytosis.  Patient is without weakness, saddle anesthesia, fever, unintentional weight loss, history of IV drug use.  patient likely has acute on chronic low back pain.  She states she is unable to take NSAIDs due to concern of gastric ulcer.  She states she occasionally takes ibuprofen with relief of her pain.  We will provide her with a prednisone Dosepak instead.  Discussed Tylenol use.  We will also provide her with lidocaine patch.  Patient is in agreement with this plan.  She does not have PCP.  We will provide her information for [facility].   Lab  Tests: -I ordered, reviewed, and interpreted labs.   The pertinent results include:   Labs Reviewed  URINALYSIS, ROUTINE W REFLEX MICROSCOPIC - Abnormal; Notable for the following components:      Result Value   Specific Gravity, Urine 1.035 (*)    Hgb urine dipstick SMALL (*)    All other components within normal limits  CBC WITH DIFFERENTIAL/PLATELET - Abnormal; Notable for the following components:   WBC 11.4 (*)    Monocytes Absolute 1.2 (*)    All other components within normal limits  COMPREHENSIVE METABOLIC PANEL - Abnormal; Notable for the following components:   Sodium 134 (*)    CO2 21 (*)    Glucose, Bld 103 (*)    All other components within normal limits  PREGNANCY, URINE  EKG  EKG Interpretation  Date/Time:    Ventricular Rate:    PR Interval:    QRS Duration:   QT Interval:    QTC Calculation:   R Axis:     Text Interpretation:           Imaging Studies ordered: I ordered imaging studies including CT renal stone study I independently visualized and interpreted imaging. I agree with the radiologist interpretation   Medicines ordered and prescription drug management: Meds ordered this encounter  Medications   lactated ringers bolus 1,000 mL   morphine (PF) 4 MG/ML injection 4 mg   ondansetron (ZOFRAN) injection 4 mg    -I have reviewed the patients home medicines and have made adjustments as needed   Reevaluation: After the interventions noted above, I reevaluated the patient and found that they have :improved  Co morbidities that complicate the patient evaluation  Past Medical History:  Diagnosis Date   Anemia    Asthma    Depression    Family history of adverse reaction to anesthesia    Hypertension    Kidney stones    Migraines    Ovarian cyst    Vaginal Pap smear, abnormal       Dispostion: Patient is appropriate for discharge.  Discharged in stable condition.  Return precautions discussed.  Patient voices understanding  and is in agreement with plan.  Final Clinical Impression(s) / ED Diagnoses Final diagnoses:  Acute right-sided low back pain, unspecified whether sciatica present    Rx / DC Orders ED Discharge Orders          Ordered    predniSONE (STERAPRED UNI-PAK 21 TAB) 10 MG (21) TBPK tablet  Daily        12/03/21 1732    lidocaine (LIDODERM) 5 %  Every 24 hours        12/03/21 1732              Evlyn Courier, PA-C 12/03/21 1735

## 2022-03-17 ENCOUNTER — Other Ambulatory Visit (HOSPITAL_COMMUNITY): Payer: Self-pay | Admitting: Obstetrics and Gynecology

## 2022-03-17 DIAGNOSIS — N644 Mastodynia: Secondary | ICD-10-CM

## 2022-03-18 IMAGING — MG MM DIGITAL SCREENING BILAT W/ TOMO AND CAD
6 of 10 series · 6 of 30 positions shown · non-contrast
Comparison: Previous exam(s).

CLINICAL DATA: Screening.

EXAM:
DIGITAL SCREENING BILATERAL MAMMOGRAM WITH TOMOSYNTHESIS AND CAD
TECHNIQUE: Bilateral screening digital craniocaudal and mediolateral oblique
mammograms were obtained. Bilateral screening digital breast
tomosynthesis was performed. The images were evaluated with
computer-aided detection.

[L MLO synth-2D]
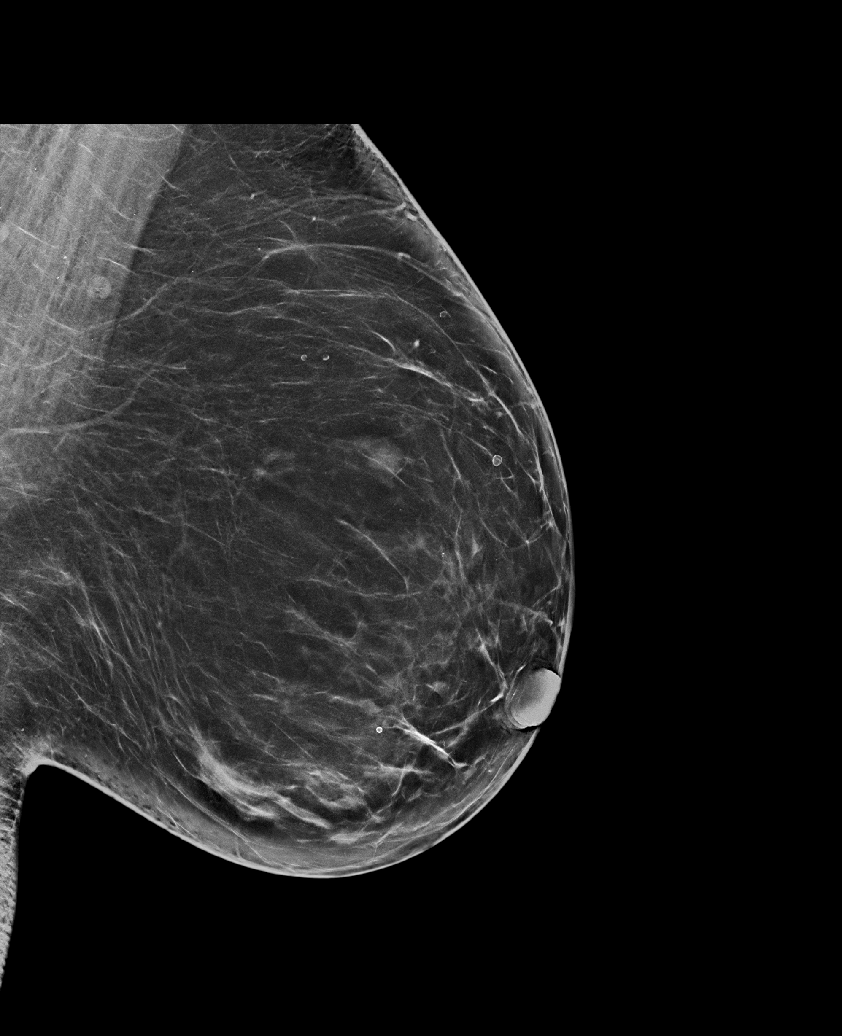

[L CC synth-2D (1 of 2)]
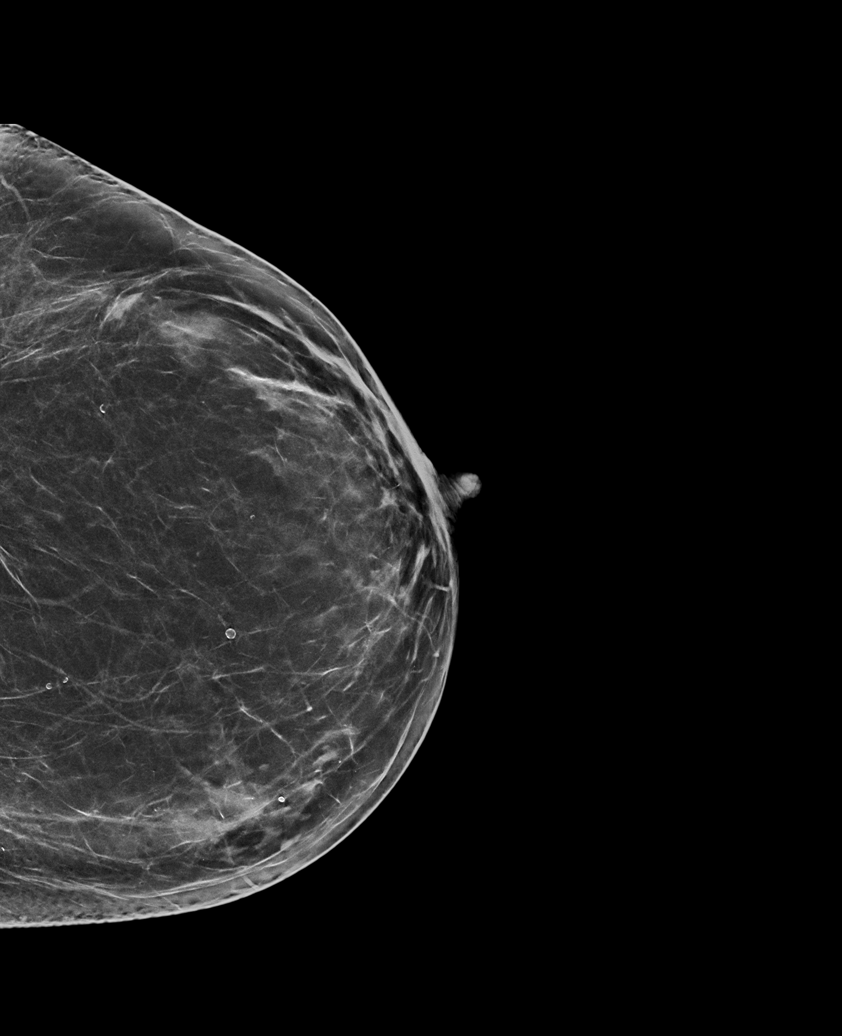

[R MLO synth-2D]
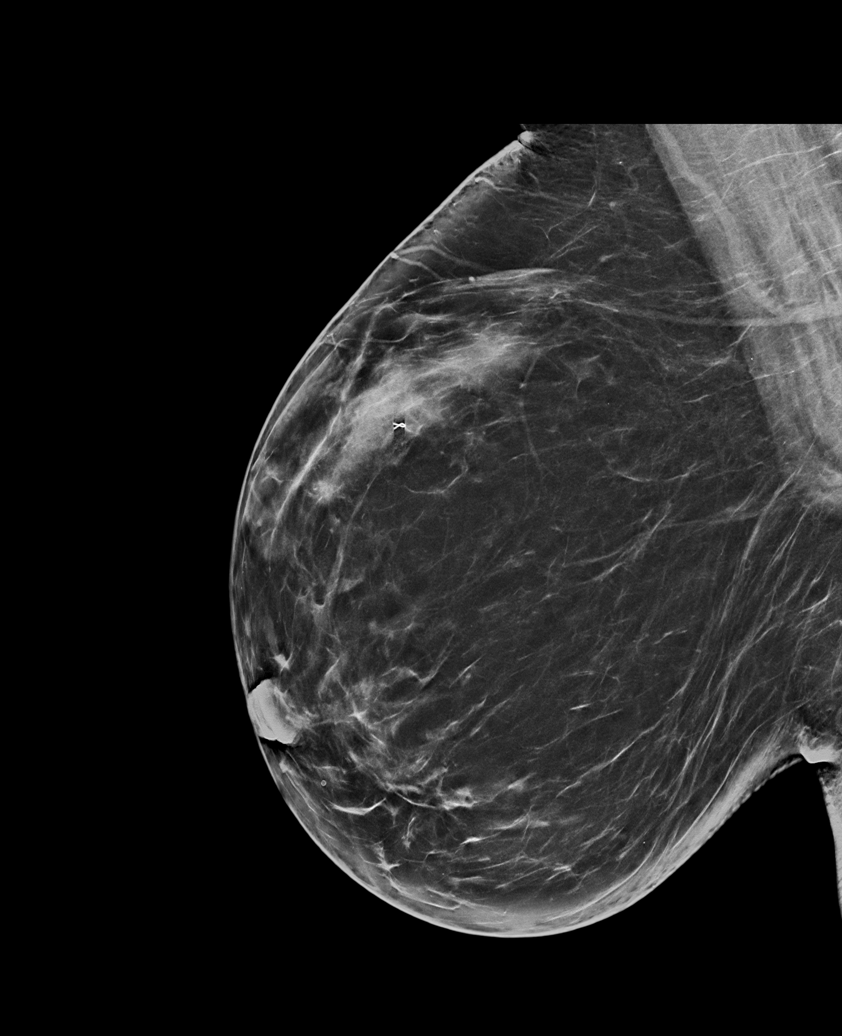

[R CC synth-2D]
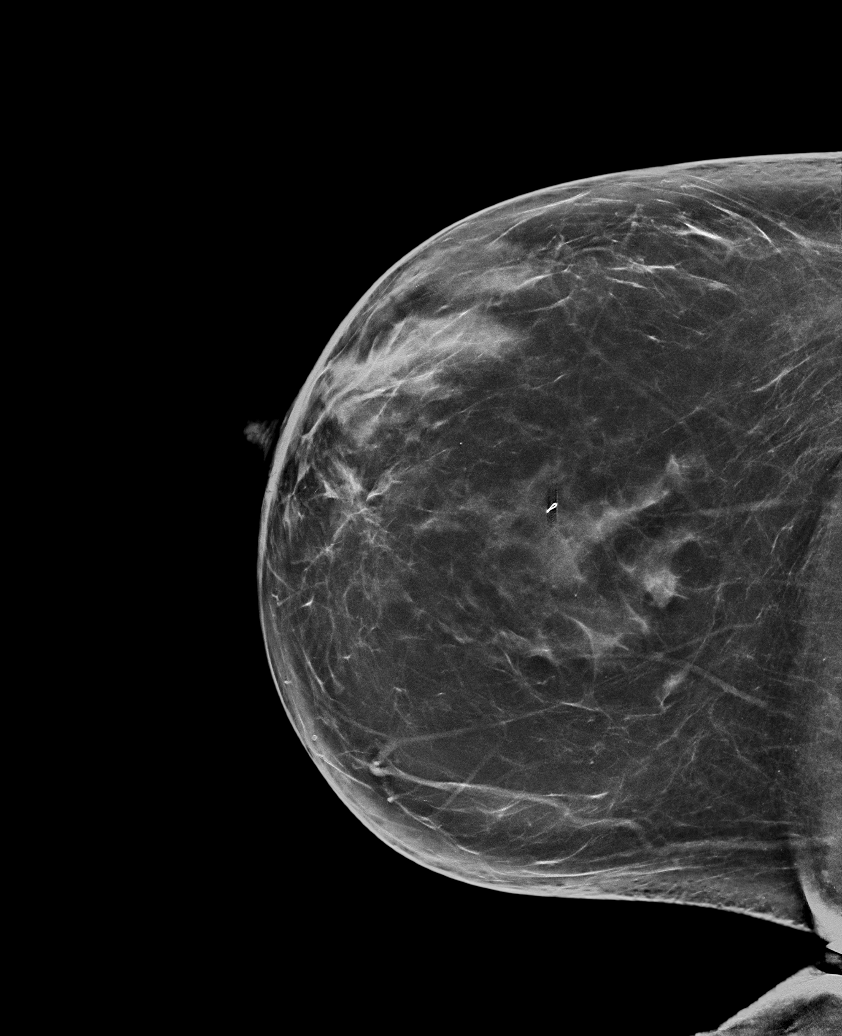

[L CC synth-2D (2 of 2)]
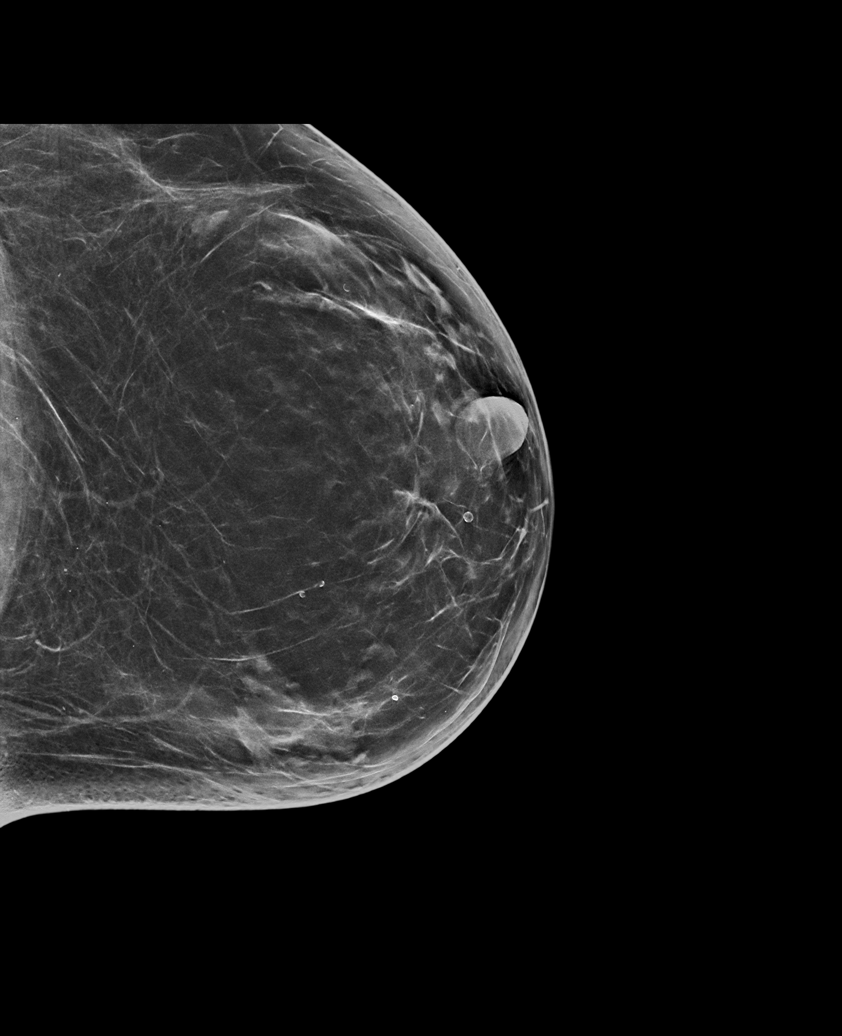

[L CC tomo · tomo slice 41/82.0]
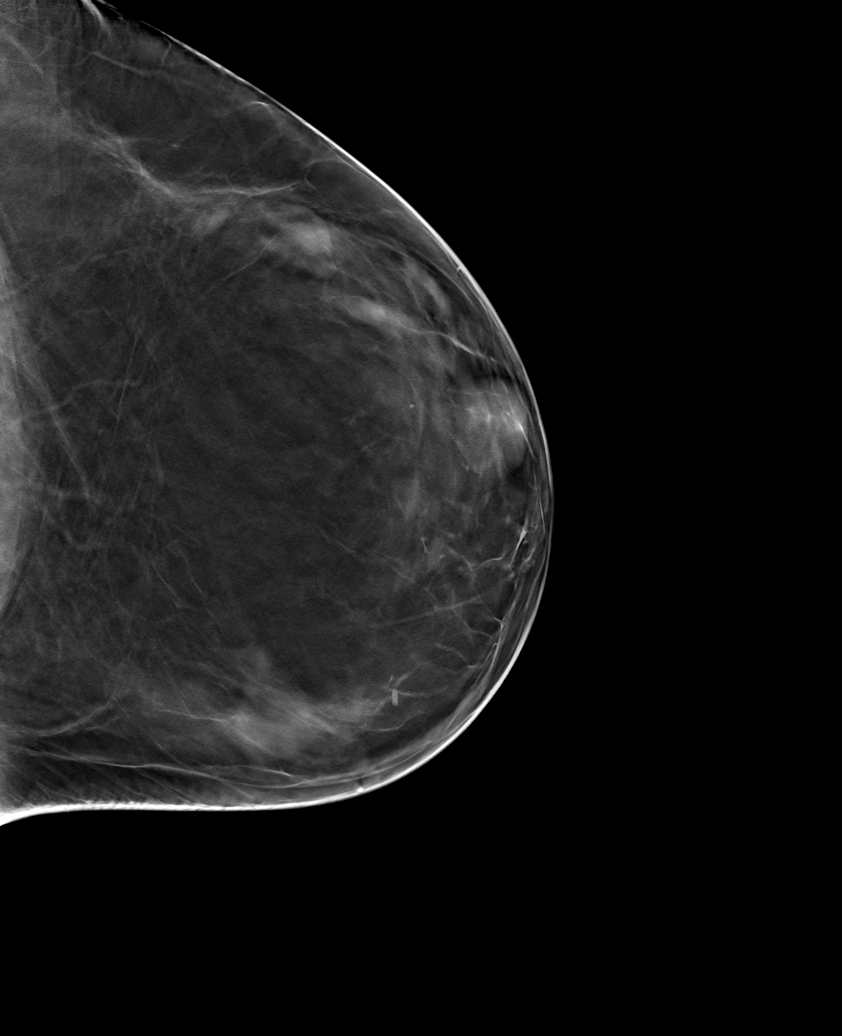

[6 of 30 positions shown; findings below may reference images not displayed]

ACR Breast Density Category b: There are scattered areas of
fibroglandular density.
FINDINGS: In the left breast, a possible asymmetry warrants further
evaluation. In the right breast, no findings suspicious for
malignancy.
IMPRESSION: Further evaluation is suggested for possible asymmetry in the left
breast.

RECOMMENDATION:
Diagnostic mammogram and possibly ultrasound of the left breast.
(Code:[id number])

The patient will be contacted regarding the findings, and additional
imaging will be scheduled.

BI-RADS CATEGORY  0: Incomplete. Need additional imaging evaluation
and/or prior mammograms for comparison.

## 2022-04-03 ENCOUNTER — Ambulatory Visit: Payer: Self-pay

## 2022-04-24 ENCOUNTER — Ambulatory Visit: Payer: Self-pay

## 2022-04-28 ENCOUNTER — Encounter (HOSPITAL_COMMUNITY): Payer: Self-pay

## 2022-05-24 ENCOUNTER — Emergency Department (HOSPITAL_COMMUNITY): Payer: Self-pay

## 2022-05-24 ENCOUNTER — Encounter (HOSPITAL_COMMUNITY): Payer: Self-pay | Admitting: *Deleted

## 2022-05-24 ENCOUNTER — Emergency Department (HOSPITAL_COMMUNITY)
Admission: EM | Admit: 2022-05-24 | Discharge: 2022-05-24 | Disposition: A | Discharge status: Home / Self Care | Payer: Self-pay | Attending: Emergency Medicine | Admitting: Emergency Medicine

## 2022-05-24 ENCOUNTER — Other Ambulatory Visit: Payer: Self-pay

## 2022-05-24 DIAGNOSIS — J101 Influenza due to other identified influenza virus with other respiratory manifestations: Secondary | ICD-10-CM

## 2022-05-24 DIAGNOSIS — Z7951 Long term (current) use of inhaled steroids: Secondary | ICD-10-CM | POA: Insufficient documentation

## 2022-05-24 DIAGNOSIS — Z20822 Contact with and (suspected) exposure to covid-19: Secondary | ICD-10-CM | POA: Insufficient documentation

## 2022-05-24 DIAGNOSIS — J09X2 Influenza due to identified novel influenza A virus with other respiratory manifestations: Secondary | ICD-10-CM | POA: Insufficient documentation

## 2022-05-24 LAB — CBC WITH DIFFERENTIAL/PLATELET
Abs Immature Granulocytes: 0.01 10*3/uL (ref 0.00–0.07)
Basophils Absolute: 0 10*3/uL (ref 0.0–0.1)
Basophils Relative: 0 %
Eosinophils Absolute: 0 10*3/uL (ref 0.0–0.5)
Eosinophils Relative: 1 %
HCT: 39.4 % (ref 36.0–46.0)
Hemoglobin: 13.4 g/dL (ref 12.0–15.0)
Immature Granulocytes: 0 %
Lymphocytes Relative: 24 %
Lymphs Abs: 1.4 10*3/uL (ref 0.7–4.0)
MCH: 31 pg (ref 26.0–34.0)
MCHC: 34 g/dL (ref 30.0–36.0)
MCV: 91.2 fL (ref 80.0–100.0)
Monocytes Absolute: 0.9 10*3/uL (ref 0.1–1.0)
Monocytes Relative: 15 %
Neutro Abs: 3.4 10*3/uL (ref 1.7–7.7)
Neutrophils Relative %: 60 %
Platelets: 179 10*3/uL (ref 150–400)
RBC: 4.32 MIL/uL (ref 3.87–5.11)
RDW: 13.4 % (ref 11.5–15.5)
WBC: 5.7 10*3/uL (ref 4.0–10.5)
nRBC: 0 % (ref 0.0–0.2)

## 2022-05-24 LAB — RESP PANEL BY RT-PCR (FLU A&B, COVID) ARPGX2
Influenza A by PCR: POSITIVE — AB
Influenza B by PCR: NEGATIVE
SARS Coronavirus 2 by RT PCR: NEGATIVE

## 2022-05-24 LAB — COMPREHENSIVE METABOLIC PANEL
ALT: 21 U/L (ref 0–44)
AST: 20 U/L (ref 15–41)
Albumin: 3.6 g/dL (ref 3.5–5.0)
Alkaline Phosphatase: 77 U/L (ref 38–126)
Anion gap: 8 (ref 5–15)
BUN: 7 mg/dL (ref 6–20)
CO2: 19 mmol/L — ABNORMAL LOW (ref 22–32)
Calcium: 9.5 mg/dL (ref 8.9–10.3)
Chloride: 107 mmol/L (ref 98–111)
Creatinine, Ser: 0.79 mg/dL (ref 0.44–1.00)
GFR, Estimated: >60 mL/min (ref >60)
Glucose, Bld: 96 mg/dL (ref 70–99)
Potassium: 3.7 mmol/L (ref 3.5–5.1)
Sodium: 134 mmol/L — ABNORMAL LOW (ref 135–145)
Total Bilirubin: 0.4 mg/dL (ref 0.3–1.2)
Total Protein: 6.7 g/dL (ref 6.5–8.1)

## 2022-05-24 MED ORDER — CYCLOBENZAPRINE HCL 10 MG PO TABS
5.0000 mg | ORAL_TABLET | Freq: Once | ORAL | Status: AC
  Administered 2022-05-24: 5 mg via ORAL
  Filled 2022-05-24: qty 1

## 2022-05-24 MED ORDER — SODIUM CHLORIDE 0.9 % IV BOLUS
1000.0000 mL | Freq: Once | INTRAVENOUS | Status: AC
  Administered 2022-05-24: 1000 mL via INTRAVENOUS

## 2022-05-24 MED ORDER — ONDANSETRON HCL 4 MG/2ML IJ SOLN
4.0000 mg | Freq: Once | INTRAMUSCULAR | Status: AC
  Administered 2022-05-24: 4 mg via INTRAVENOUS
  Filled 2022-05-24: qty 2

## 2022-05-24 MED ORDER — KETOROLAC TROMETHAMINE 15 MG/ML IJ SOLN
15.0000 mg | Freq: Once | INTRAMUSCULAR | Status: AC
  Administered 2022-05-24: 15 mg via INTRAVENOUS
  Filled 2022-05-24: qty 1

## 2022-05-24 NOTE — Discharge Instructions (Addendum)
Please follow-up with your primary care doctor, take Tylenol ibuprofen for your muscle aches and get lots of rest.  Make sure you are drinking a lot of fluids, if you are unable to keep anything down, and become severely short of breath or chest pain please return to the ER.

## 2022-05-24 NOTE — ED Provider Notes (Signed)
[facility] EMERGENCY DEPARTMENT Provider Note   CSN: [id number] Arrival date & time: 05/24/22  1455     History  Chief Complaint  Patient presents with   flu symptoms    Wendy Singh is a 46 y.o. female, past medical history, who presents to the ED secondary to fever, sore throat, nausea, body aches, and a dry cough for the last 3 days.  She states that her sister was admitted to the hospital for flu and pneumonia 3 days ago, and she believes that she may have gotten the flu from her.  Denies any chest pain, shortness of breath, or abdominal pain.  Last took Tylenol around 2:00.     Home Medications Prior to Admission medications   Medication Sig Start Date End Date Taking? Authorizing Provider  benzonatate (TESSALON) 100 MG capsule Take 1 capsule (100 mg total) by mouth every 8 (eight) hours. 06/17/21   Honor Loh M, PA-C  fluticasone (FLONASE) 50 MCG/ACT nasal spray Place 2 sprays into both nostrils daily. 06/17/21   Honor Loh M, PA-C  lidocaine (LIDODERM) 5 % Place 1 patch onto the skin daily. Remove & Discard patch within 12 hours or as directed by MD 12/03/21   Marita Kansas, PA-C  ondansetron (ZOFRAN) 4 MG tablet Take 1 tablet (4 mg total) by mouth every 6 (six) hours. 01/29/21   Theron Arista, PA-C  ondansetron (ZOFRAN-ODT) 4 MG disintegrating tablet Take 1 tablet (4 mg total) by mouth every 8 (eight) hours as needed for nausea or vomiting. 06/17/21   Teressa Lower, PA-C  oxyCODONE (OXY IR/ROXICODONE) 5 MG immediate release tablet Take 1 tablet (5 mg total) by mouth every 6 (six) hours as needed for moderate pain, severe pain or breakthrough pain. 05/28/21   Abigail Miyamoto, MD  pantoprazole (PROTONIX) 20 MG tablet Take 1 tablet (20 mg total) by mouth daily. 01/29/21   Theron Arista, PA-C  predniSONE (STERAPRED UNI-PAK 21 TAB) 10 MG (21) TBPK tablet Take by mouth daily. Take 6 tabs by mouth daily  for 2 days, then 5 tabs for 2 days, then 4 tabs for 2 days, then 3 tabs for  2 days, 2 tabs for 2 days, then 1 tab by mouth daily for 2 days 12/03/21   Karie Mainland, Amjad, PA-C  PROVENTIL HFA 108 (90 Base) MCG/ACT inhaler INHALE 2 PUFFS BY MOUTH EVERY 6 HOURS AS NEEDED FOR WHEEZING OR SHORTNESS OF BREATH Patient taking differently: Inhale 2 puffs into the lungs every 6 (six) hours as needed. 10/17/19   Jacquelin Hawking, PA-C      Allergies    Penicillins    Review of Systems   Review of Systems  Constitutional:  Positive for chills and fever.  Respiratory:  Positive for shortness of breath.   Gastrointestinal:  Positive for nausea. Negative for diarrhea and vomiting.    Physical Exam Updated Vital Signs BP 126/77 (BP Location: Right Arm)   Pulse 86   Temp 99.9 F (37.7 C) (Oral)   Resp 16   Ht 5\' 11"  (1.803 m)   Wt 103.9 kg   LMP 05/23/2022   SpO2 96%   BMI 31.94 kg/m  Physical Exam Vitals and nursing note reviewed.  Constitutional:      General: She is not in acute distress.    Appearance: She is well-developed.  HENT:     Head: Normocephalic and atraumatic.  Eyes:     Conjunctiva/sclera: Conjunctivae normal.  Cardiovascular:     Rate and Rhythm: Normal rate and  regular rhythm.     Heart sounds: No murmur heard. Pulmonary:     Effort: Pulmonary effort is normal. No respiratory distress.     Breath sounds: Normal breath sounds.  Abdominal:     Palpations: Abdomen is soft.     Tenderness: There is no abdominal tenderness.  Musculoskeletal:        General: No swelling.     Cervical back: Neck supple.  Skin:    General: Skin is warm and dry.     Capillary Refill: Capillary refill takes less than 2 seconds.  Neurological:     Mental Status: She is alert.  Psychiatric:        Mood and Affect: Mood normal.     ED Results / Procedures / Treatments   Labs (all labs ordered are listed, but only abnormal results are displayed) Labs Reviewed  RESP PANEL BY RT-PCR (FLU A&B, COVID) ARPGX2 - Abnormal; Notable for the following components:      Result  Value   Influenza A by PCR POSITIVE (*)    All other components within normal limits  COMPREHENSIVE METABOLIC PANEL - Abnormal; Notable for the following components:   Sodium 134 (*)    CO2 19 (*)    All other components within normal limits  CBC WITH DIFFERENTIAL/PLATELET    EKG None  Radiology DG Chest 2 View  Result Date: 05/24/2022 CLINICAL DATA:  Shortness of breath EXAM: CHEST - 2 VIEW COMPARISON:  06/17/21 chest x-ray FINDINGS: No pleural effusion. No pneumothorax. Unchanged cardiac and mediastinal contours. No displaced rib fractures. Visualized upper abdomen is unremarkable. Vertebral body heights are maintained. IMPRESSION: No active cardiopulmonary disease. Electronically Signed   By: Lorenza Cambridge M.D.   On: 05/24/2022 16:30    Procedures Procedures    Medications Ordered in ED Medications  sodium chloride 0.9 % bolus 1,000 mL (1,000 mLs Intravenous New Bag/Given 05/24/22 1640)  ketorolac (TORADOL) 15 MG/ML injection 15 mg (15 mg Intravenous Given 05/24/22 1638)  ondansetron (ZOFRAN) injection 4 mg (4 mg Intravenous Given 05/24/22 1636)  cyclobenzaprine (FLEXERIL) tablet 5 mg (5 mg Oral Given 05/24/22 1805)    ED Course/ Medical Decision Making/ A&P                           Medical Decision Making Patient is a 46 year old female, here for flu symptoms, and complaints of cough, fever, fatigue.  Will give IV hydration for further Intermatic management, also give Toradol for fever, body aches.  Obtain electrolytes to further evaluate.  Chest x-ray given cough and patient's fear for pneumonia.    Amount and/or Complexity of Data Reviewed Labs: ordered.    Details: Unremarkable except for positive for flu. Radiology: ordered.    Details: Clear Discussion of management or test interpretation with external provider(s): Discussed with patient positive for influenza, discussed supportive care, need for follow-up with primary care doctor, and isolation to prevent further  spread of her illness.  Return precautions emphasized.  Risk Prescription drug management.   Final Clinical Impression(s) / ED Diagnoses Final diagnoses:  Influenza A    Rx / DC Orders ED Discharge Orders     None         Brance Dartt Elbert Ewings, PA 05/24/22 1829    Rexford Maus, DO 05/25/22 [phone]

## 2022-05-24 NOTE — ED Triage Notes (Signed)
Pt states her sister has the flu and pt has been exposed.  Pt with symptoms-nausea, sore throat and fever.  Body aches. Cough. Last fever reducer an hour ago, tylenol.

## 2022-06-05 ENCOUNTER — Inpatient Hospital Stay: Payer: Self-pay | Attending: Obstetrics and Gynecology | Admitting: Hematology and Oncology

## 2022-06-05 VITALS — BP 130/81 | Wt 235.7 lb

## 2022-06-05 DIAGNOSIS — N644 Mastodynia: Secondary | ICD-10-CM

## 2022-06-05 NOTE — Progress Notes (Signed)
[name] is a 46 y.o. female who presents to [facility] clinic today with complaint of ongoing left breast pain and prolonged, heavy menstrual periods. .    Pap Smear: Pap not smear completed today. Last Pap smear was 2022 at [facility] clinic and was normal. Per patient has history of an abnormal Pap smear. Last Pap smear result is available in Epic. Abnormal pap smear 05/30/2013 [facility], colpo 02/11/2014 LEEP; 10/12/2016 with [facility].   Physical exam: Breasts Breasts symmetrical. No skin abnormalities bilateral breasts. No nipple retraction bilateral breasts. No nipple discharge bilateral breasts. No lymphadenopathy. No lumps palpated bilateral breasts.     MM Breast Surgical Specimen  Result Date: 05/28/2021 CLINICAL DATA:  Status post surgical excision today after earlier bracketed radioactive seed localizations (2 seeds placed). EXAM: SPECIMEN RADIOGRAPH OF THE LEFT BREAST COMPARISON:  Previous exam(s). FINDINGS: Status post excision of the left breast. The 2 radioactive seeds and single biopsy marker clip are present and completely intact within the specimen. Findings discussed with the OR staff during the procedure. IMPRESSION: Specimen radiograph of the left breast. Electronically Signed   By: Franki Cabot M.D.   On: 05/28/2021 10:37  MM LT RAD SEED EA ADD LESION LOC MAMMO  Result Date: 05/27/2021 CLINICAL DATA:  Patient with a LEFT breast mass scheduled for surgical excision requiring preoperative bracketed radioactive seed localization. The LEFT breast mass was described as tubular and measuring 3.7 cm greatest dimension on ultrasound dated 03/25/2021. EXAM: MAMMOGRAPHIC GUIDED RADIOACTIVE SEED LOCALIZATION OF THE LEFT BREAST x2 COMPARISON:  Previous exam(s). FINDINGS: Patient presents for radioactive seed localization prior to surgical excision that requires bracketed radioactive seed localization. I met with the patient and we discussed the procedure of seed localization including benefits  and alternatives. We discussed the high likelihood of a successful procedure. We discussed the risks of the procedure including infection, bleeding, tissue injury and further surgery. We discussed the low dose of radioactivity involved in the procedure. Informed, written consent was given. The usual time-out protocol was performed immediately prior to the procedure. Site 1: Using mammographic guidance, sterile technique, 1% lidocaine and an I-125 radioactive seed, posterior non-biopsied portion of the asymmetry within the outer LEFT breast was localized using a lateral approach. The follow-up mammogram images confirm the seed in the expected location and were marked for Dr. Ninfa Linden. Follow-up survey of the patient confirms presence of the radioactive seed. Order number of I-125 seed:  [id number]. Total activity:  2.025 millicuries reference Date: 04/10/2021 Site 2: Using mammographic guidance, sterile technique, 1% lidocaine and an I-125 radioactive seed, the ribbon shaped clip within the outer LEFT breast was localized using a lateral approach. The follow-up mammogram images confirm the seed in the expected location and were marked for Dr. Ninfa Linden. Follow-up survey of the patient confirms presence of the radioactive seed. Order number of I-125 seed:  [id number]. Total activity:  2.831 millicuries reference Date: 04/14/2021 The patient tolerated the procedure well and was released from the [place]. She was given instructions regarding seed removal. IMPRESSION: Radioactive seed localization left breast x2 (bracketed localization with 2 seeds placed in the outer LEFT breast). No apparent complications. The seeds are located 3.8 cm apart, corresponding to the size of the tubular mass which is 3.7 cm in length as described on ultrasound report of 03/25/2021. Electronically Signed   By: Franki Cabot M.D.   On: 05/27/2021 14:46  MM LT RADIOACTIVE SEED LOC MAMMO GUIDE  Result Date: 05/27/2021 CLINICAL DATA:   Patient with  a LEFT breast mass scheduled for surgical excision requiring preoperative bracketed radioactive seed localization. The LEFT breast mass was described as tubular and measuring 3.7 cm greatest dimension on ultrasound dated 03/25/2021. EXAM: MAMMOGRAPHIC GUIDED RADIOACTIVE SEED LOCALIZATION OF THE LEFT BREAST x2 COMPARISON:  Previous exam(s). FINDINGS: Patient presents for radioactive seed localization prior to surgical excision that requires bracketed radioactive seed localization. I met with the patient and we discussed the procedure of seed localization including benefits and alternatives. We discussed the high likelihood of a successful procedure. We discussed the risks of the procedure including infection, bleeding, tissue injury and further surgery. We discussed the low dose of radioactivity involved in the procedure. Informed, written consent was given. The usual time-out protocol was performed immediately prior to the procedure. Site 1: Using mammographic guidance, sterile technique, 1% lidocaine and an I-125 radioactive seed, posterior non-biopsied portion of the asymmetry within the outer LEFT breast was localized using a lateral approach. The follow-up mammogram images confirm the seed in the expected location and were marked for Dr. Ninfa Linden. Follow-up survey of the patient confirms presence of the radioactive seed. Order number of I-125 seed:  [id number]. Total activity:  8.127 millicuries reference Date: 04/10/2021 Site 2: Using mammographic guidance, sterile technique, 1% lidocaine and an I-125 radioactive seed, the ribbon shaped clip within the outer LEFT breast was localized using a lateral approach. The follow-up mammogram images confirm the seed in the expected location and were marked for Dr. Ninfa Linden. Follow-up survey of the patient confirms presence of the radioactive seed. Order number of I-125 seed:  [id number]. Total activity:  4.496 millicuries reference Date: 04/14/2021 The patient  tolerated the procedure well and was released from the [place]. She was given instructions regarding seed removal. IMPRESSION: Radioactive seed localization left breast x2 (bracketed localization with 2 seeds placed in the outer LEFT breast). No apparent complications. The seeds are located 3.8 cm apart, corresponding to the size of the tubular mass which is 3.7 cm in length as described on ultrasound report of 03/25/2021. Electronically Signed   By: Franki Cabot M.D.   On: 05/27/2021 14:46  MS CLIP PLACEMENT LEFT  Result Date: 04/08/2021 CLINICAL DATA:  Post ultrasound-guided biopsy of a 3.7 cm tubular mass in the left breast at the 2 o'clock position. EXAM: DIAGNOSTIC LEFT MAMMOGRAM POST ULTRASOUND BIOPSY COMPARISON:  Previous exams. FINDINGS: Mammographic images were obtained following ultrasound-guided biopsy of a 3.7 cm tubular mass in the left breast at the 2 o'clock position. A ribbon shaped biopsy marking clip is present at the anterior site of the biopsied tubular mass in the left breast at the 2 o'clock position. IMPRESSION: Ribbon shaped biopsy marking clip located within anterior portion of the 3.7 cm tubular mass in the left breast at the 2 o'clock position. Final Assessment: Post Procedure Mammograms for Marker Placement Electronically Signed   By: Everlean Alstrom M.D.   On: 04/08/2021 09:30  MS DIGITAL DIAG TOMO UNI LEFT  Result Date: 03/25/2021 CLINICAL DATA:  Screening recall for possible left breast asymmetry. EXAM: DIGITAL DIAGNOSTIC UNILATERAL LEFT MAMMOGRAM WITH TOMOSYNTHESIS AND CAD; ULTRASOUND LEFT BREAST LIMITED TECHNIQUE: Left digital diagnostic mammography and breast tomosynthesis was performed. The images were evaluated with computer-aided detection.; Targeted ultrasound examination of the left breast was performed. COMPARISON:  Previous exams. ACR Breast Density Category b: There are scattered areas of fibroglandular density. FINDINGS: Spot compression tomograms were  performed over the upper-outer left breast demonstrating a persistent asymmetry in the upper-outer left breast measuring approximately 3.1  cm. Targeted ultrasound of the upper-outer left breast was performed demonstrating an irregular tubular mass in the left breast at 2 o'clock 5 cm from nipple measuring 3.7 x 0.3 x 1.1 cm. This is felt to correspond well with the asymmetry seen in the upper-outer left breast at mammography. No lymphadenopathy seen in the left axilla. IMPRESSION: Suspicious 3.7 cm mass in the left breast at the 2 o'clock position. RECOMMENDATION: Recommend ultrasound-guided biopsy of the mass in the left breast at the 2 o'clock position. I have discussed the findings and recommendations with the patient. If applicable, a reminder letter will be sent to the patient regarding the next appointment. BI-RADS CATEGORY  4: Suspicious. Electronically Signed   By: Everlean Alstrom M.D.   On: 03/25/2021 14:15  MS DIGITAL SCREENING TOMO BILATERAL  Result Date: 03/05/2021 CLINICAL DATA:  Screening. EXAM: DIGITAL SCREENING BILATERAL MAMMOGRAM WITH TOMOSYNTHESIS AND CAD TECHNIQUE: Bilateral screening digital craniocaudal and mediolateral oblique mammograms were obtained. Bilateral screening digital breast tomosynthesis was performed. The images were evaluated with computer-aided detection. COMPARISON:  Previous exam(s). ACR Breast Density Category b: There are scattered areas of fibroglandular density. FINDINGS: In the left breast, a possible asymmetry warrants further evaluation. In the right breast, no findings suspicious for malignancy. IMPRESSION: Further evaluation is suggested for possible asymmetry in the left breast. RECOMMENDATION: Diagnostic mammogram and possibly ultrasound of the left breast. (Code:FI-L-63M) The patient will be contacted regarding the findings, and additional imaging will be scheduled. BI-RADS CATEGORY  0: Incomplete. Need additional imaging evaluation and/or prior mammograms for  comparison. Electronically Signed   By: Fidela Salisbury M.D.   On: 03/05/2021 13:56   MS DIGITAL SCREENING TOMO BILATERAL  Result Date: 07/28/2018 CLINICAL DATA:  Screening. EXAM: DIGITAL SCREENING BILATERAL MAMMOGRAM WITH TOMO AND CAD COMPARISON:  Previous exam(s). ACR Breast Density Category b: There are scattered areas of fibroglandular density. FINDINGS: There are no findings suspicious for malignancy. Images were processed with CAD. IMPRESSION: No mammographic evidence of malignancy. A result letter of this screening mammogram will be mailed directly to the patient. RECOMMENDATION: Screening mammogram in one year. (Code:[id number]-B-01Y) BI-RADS CATEGORY  1: Negative. Electronically Signed   By: Everlean Alstrom M.D.   On: 07/28/2018 14:32   MM DIAG BREAST TOMO BILATERAL  Result Date: 06/17/2017 CLINICAL DATA:  46 year old female with a left breast palpable abnormality. Benign right breast biopsy September 2014. EXAM: 2D DIGITAL DIAGNOSTIC BILATERAL MAMMOGRAM WITH CAD AND ADJUNCT TOMO BILATERAL BREAST ULTRASOUND COMPARISON:  Previous exam(s). ACR Breast Density Category b: There are scattered areas of fibroglandular density. FINDINGS: No suspicious masses or calcifications seen in either breast. An initially questioned asymmetry in the slightly upper outer anterior right breast demonstrates imaging features suggestive of focal fibroglandular tissue on the additional spot compression tomograms. Spot compression tangential tomograms were performed over the palpable area of concern in the upper-outer left breast with no definite mammographic abnormality identified. Mammographic images were processed with CAD. Physical examination of the upper-outer left breast does not reveal any palpable masses. Targeted ultrasound of the left breast was performed. No suspicious masses or abnormalities identified, only normal-appearing fibroglandular tissue seen. Targeted ultrasound of the upper outer anterior right breast was  performed with an area of focal heterogeneous fibroglandular tissue seen at 10 o'clock 3 cm from nipple. This corresponds well with the asymmetry seen in the right breast at mammography. IMPRESSION: No findings of malignancy in either breast. No mammographic or sonographic correlate for the left breast palpable abnormality. RECOMMENDATION: 1. Recommend further management  of the left breast palpable abnormality be based on clinical assessment. 2.  Screening mammogram in one year.(Code:[id number]-01Y) I have discussed the findings and recommendations with the patient. Results were also provided in writing at the conclusion of the visit. If applicable, a reminder letter will be sent to the patient regarding the next appointment. BI-RADS CATEGORY  1: Negative. Electronically Signed   By: Everlean Alstrom M.D.   On: 06/17/2017 11:55      Pelvic/Bimanual Pap is not indicated today    Smoking History: Patient has never smoked and was not referred to quit line.    Patient Navigation: Patient education provided. Access to services provided for patient through [facility] program. No interpreter provided. No transportation provided   Colorectal Cancer Screening: Per patient has never had colonoscopy completed No complaints today.    Breast and Cervical Cancer Risk Assessment: Patient does not have family history of breast cancer, known genetic mutations, or radiation treatment to the chest before age 80. Patient has history of cervical dysplasia, immunocompromised, or DES exposure in-utero.  Risk Assessment   No risk assessment data for the current encounter  Risk Scores       02/28/2021   Last edited by: Loletta Parish, RN   5-year risk: 0.7 %   Lifetime risk: 8 %            A: [facility] exam without pap smear Complaint of ongoing left breast pain being followed by radiology. She has a history of prolonged, heavy menstrual bleeding and was told in the past that she has fibroids. She would like  referral to gynecology for management including possible ablation and hysterectomy. We discussed that she needs a PCP with assessment of iron studies as she may need iron management while awaiting treatment. She is agreeable to all recommendations. I will send referral to [facility] for further assessment.   P: [facility] to the [facility] for [facility] mamm[facility]. Appointment scheduled 06/09/22.  Dayton Scrape A, NP 06/05/2022 10:03 AM

## 2022-06-05 NOTE — Patient Instructions (Addendum)
Taught Wendy Singh about self breast awareness and gave educational materials to take home. Patient did not need a Pap smear today due to last Pap smear was in 2022 per patient. Let her know [facility] will cover Pap smears every 5 years unless has a history of abnormal Pap smears. Referred [facility] to the [facility] for diagnostic [facility]. Appointment scheduled for 06/09/22. Patient aware of appointment and will be there. Let patient know will follow up with her within the next couple weeks with results. Will send referral to gynecology for heavy, prolonged menstrual periods (history of fibroids) for assessment and management.  Wendy Singh verbalized understanding.  Melodye Ped, NP 10:04 AM

## 2022-06-09 ENCOUNTER — Inpatient Hospital Stay (HOSPITAL_COMMUNITY): Admission: RE | Admit: 2022-06-09 | Payer: Self-pay | Source: Ambulatory Visit

## 2022-06-11 ENCOUNTER — Encounter: Payer: Self-pay | Admitting: Obstetrics & Gynecology

## 2022-06-16 ENCOUNTER — Other Ambulatory Visit: Payer: Self-pay

## 2022-06-16 ENCOUNTER — Emergency Department (HOSPITAL_COMMUNITY)
Admission: EM | Admit: 2022-06-16 | Discharge: 2022-06-16 | Disposition: A | Discharge status: Home / Self Care | Payer: Self-pay | Attending: Emergency Medicine | Admitting: Emergency Medicine

## 2022-06-16 ENCOUNTER — Encounter (HOSPITAL_COMMUNITY): Payer: Self-pay | Admitting: Emergency Medicine

## 2022-06-16 ENCOUNTER — Telehealth: Payer: Self-pay

## 2022-06-16 ENCOUNTER — Emergency Department (HOSPITAL_COMMUNITY): Payer: Self-pay

## 2022-06-16 DIAGNOSIS — N939 Abnormal uterine and vaginal bleeding, unspecified: Secondary | ICD-10-CM | POA: Insufficient documentation

## 2022-06-16 DIAGNOSIS — R102 Pelvic and perineal pain: Secondary | ICD-10-CM | POA: Insufficient documentation

## 2022-06-16 LAB — CBC
HCT: 41.8 % (ref 36.0–46.0)
Hemoglobin: 14 g/dL (ref 12.0–15.0)
MCH: 31.3 pg (ref 26.0–34.0)
MCHC: 33.5 g/dL (ref 30.0–36.0)
MCV: 93.3 fL (ref 80.0–100.0)
Platelets: 243 10*3/uL (ref 150–400)
RBC: 4.48 MIL/uL (ref 3.87–5.11)
RDW: 13.2 % (ref 11.5–15.5)
WBC: 9.1 10*3/uL (ref 4.0–10.5)
nRBC: 0 % (ref 0.0–0.2)

## 2022-06-16 LAB — URINALYSIS, ROUTINE W REFLEX MICROSCOPIC
Bilirubin Urine: NEGATIVE
Glucose, UA: NEGATIVE mg/dL
Ketones, ur: NEGATIVE mg/dL
Leukocytes,Ua: NEGATIVE
Nitrite: NEGATIVE
Protein, ur: 100 mg/dL — AB
RBC / HPF: >50 RBC/hpf — ABNORMAL HIGH (ref 0–5)
Specific Gravity, Urine: 1.027 (ref 1.005–1.030)
WBC, UA: >50 WBC/hpf — ABNORMAL HIGH (ref 0–5)
pH: 5 (ref 5.0–8.0)

## 2022-06-16 LAB — BASIC METABOLIC PANEL
Anion gap: 6 (ref 5–15)
BUN: 15 mg/dL (ref 6–20)
CO2: 22 mmol/L (ref 22–32)
Calcium: 9.8 mg/dL (ref 8.9–10.3)
Chloride: 106 mmol/L (ref 98–111)
Creatinine, Ser: 0.78 mg/dL (ref 0.44–1.00)
GFR, Estimated: >60 mL/min (ref >60)
Glucose, Bld: 85 mg/dL (ref 70–99)
Potassium: 4 mmol/L (ref 3.5–5.1)
Sodium: 134 mmol/L — ABNORMAL LOW (ref 135–145)

## 2022-06-16 LAB — PROTIME-INR
INR: 1 (ref 0.8–1.2)
Prothrombin Time: 12.7 seconds (ref 11.4–15.2)

## 2022-06-16 LAB — HCG, SERUM, QUALITATIVE: Preg, Serum: NEGATIVE

## 2022-06-16 LAB — POC URINE PREG, ED: Preg Test, Ur: NEGATIVE

## 2022-06-16 MED ORDER — MEGESTROL ACETATE 40 MG PO TABS: 120.0000 mg | ORAL_TABLET | Freq: Every day | ORAL | 0 refills | Status: DC

## 2022-06-16 NOTE — ED Notes (Signed)
Checked on pt, standing by door as she is worried about getting sick.  No acute distress.

## 2022-06-16 NOTE — Telephone Encounter (Signed)
Patient called back, was crying, and stated that she has bled through her pants onto a chair while in the [facility] ED waiting room. Patient stated that she asked for assistance, was only allowed to speak with the Triage nurse. Patient stated that she reported the chair had blood on it, and she cleaned it with a wet paper towel. Patient stated that she told the Triage nurse that she wanted to go home and get cleaned up, was told then told per Triage nurse to keep bracelets on, and to return. Patient stated she felt like no one cared what was going and whether or not she left, was not offered any feminine products, neither was chair taken out of waiting room. Patient informed to return to [facility] ED, but to wait on sister as planned, not to drive back to ED. Patient agreed, as she initially wanted to go to a different Media, but was informed best to return to same ED as all are crowded with flu/covid. Per Etheleen Sia, RN, spoke with Lana Fish, Assistant ED Director @ [facility], who is aware of situation, return to ED. Patient stated that her sister will driver her back to the [facility] ED.

## 2022-06-16 NOTE — ED Triage Notes (Addendum)
Pt reports vaginal bleeding, reports she had her regular period 05/21/22, had a pap smear on 06/05/22 and has been bleeding ever since; pt reports intermittent vaginal bleeding similar to this x 4 months, reports she called her OBGYN and they sent her to ER for u/s and blood work, pt reports use of 1 peri-pad per hour

## 2022-06-16 NOTE — ED Notes (Signed)
Pt returned to ED.  Working on getting her a room, Agricultural consultant notified

## 2022-06-16 NOTE — ED Notes (Signed)
Pt. Stated she wanted to go home because she is bleeding a lot and she is prone to getting sick and it is too much sick people in the waiting room I offered the pt. Some pants and pads but she stated she wanted to go home and come back in a few hours.

## 2022-06-16 NOTE — ED Provider Notes (Signed)
[facility] EMERGENCY DEPARTMENT Provider Note   CSN: [id number] Arrival date & time: 06/16/22  1118     History  Chief Complaint  Patient presents with   Vaginal Bleeding    Wendy Singh is a 47 y.o. female.  She is here with a complaint of vaginal bleeding daily for months and pelvic pain for weeks.  She rates the pain as severe.  She said she bleeds daily she sometimes will take a day or 2 off but has not really stopped otherwise.  She is not on any hormone treatment not on any blood thinners.  She has tried nothing for symptoms.  [facility] who recommended she come to the emergency department for lab work and an ultrasound.  No fevers or chills.  The history is provided by the patient.  Vaginal Bleeding Quality:  Dark red Severity:  Moderate Onset quality:  Gradual Duration:  3 months Timing:  Constant Progression:  Unchanged Chronicity:  New Context: spontaneously   Relieved by:  None tried Worsened by:  Nothing Ineffective treatments:  None tried Associated symptoms: abdominal pain   Associated symptoms: no dizziness, no dysuria and no fever   Risk factors: no bleeding disorder        Home Medications Prior to Admission medications   Medication Sig Start Date End Date Taking? Authorizing Provider  benzonatate (TESSALON) 100 MG capsule Take 1 capsule (100 mg total) by mouth every 8 (eight) hours. 06/17/21   Honor Loh M, PA-C  fluticasone (FLONASE) 50 MCG/ACT nasal spray Place 2 sprays into both nostrils daily. 06/17/21   Honor Loh M, PA-C  lidocaine (LIDODERM) 5 % Place 1 patch onto the skin daily. Remove & Discard patch within 12 hours or as directed by MD 12/03/21   Marita Kansas, PA-C  ondansetron (ZOFRAN) 4 MG tablet Take 1 tablet (4 mg total) by mouth every 6 (six) hours. 01/29/21   Theron Arista, PA-C  ondansetron (ZOFRAN-ODT) 4 MG disintegrating tablet Take 1 tablet (4 mg total) by mouth every 8 (eight) hours as needed for nausea or  vomiting. 06/17/21   Teressa Lower, PA-C  oxyCODONE (OXY IR/ROXICODONE) 5 MG immediate release tablet Take 1 tablet (5 mg total) by mouth every 6 (six) hours as needed for moderate pain, severe pain or breakthrough pain. 05/28/21   Abigail Miyamoto, MD  pantoprazole (PROTONIX) 20 MG tablet Take 1 tablet (20 mg total) by mouth daily. 01/29/21   Theron Arista, PA-C  predniSONE (STERAPRED UNI-PAK 21 TAB) 10 MG (21) TBPK tablet Take by mouth daily. Take 6 tabs by mouth daily  for 2 days, then 5 tabs for 2 days, then 4 tabs for 2 days, then 3 tabs for 2 days, 2 tabs for 2 days, then 1 tab by mouth daily for 2 days 12/03/21   Karie Mainland, Amjad, PA-C  PROVENTIL HFA 108 (90 Base) MCG/ACT inhaler INHALE 2 PUFFS BY MOUTH EVERY 6 HOURS AS NEEDED FOR WHEEZING OR SHORTNESS OF BREATH Patient taking differently: Inhale 2 puffs into the lungs every 6 (six) hours as needed. 10/17/19   Jacquelin Hawking, PA-C      Allergies    Penicillins    Review of Systems   Review of Systems  Constitutional:  Negative for fever.  HENT:  Negative for sore throat.   Respiratory:  Negative for shortness of breath.   Cardiovascular:  Negative for chest pain.  Gastrointestinal:  Positive for abdominal pain.  Genitourinary:  Positive for vaginal bleeding. Negative  for dysuria.  Skin:  Negative for rash.  Neurological:  Negative for dizziness.    Physical Exam Updated Vital Signs BP (!) 146/94 (BP Location: Right Arm)   Pulse 77   Temp 98.5 F (36.9 C) (Oral)   Resp 20   Ht 5\' 11"  (1.803 m)   Wt 106.6 kg   LMP 06/05/2022 (Exact Date)   SpO2 95%   BMI 32.78 kg/m  Physical Exam Vitals and nursing note reviewed.  Constitutional:      General: She is not in acute distress.    Appearance: Normal appearance. She is well-developed.  HENT:     Head: Normocephalic and atraumatic.  Eyes:     Conjunctiva/sclera: Conjunctivae normal.  Cardiovascular:     Rate and Rhythm: Normal rate and regular rhythm.     Heart sounds: No murmur  heard. Pulmonary:     Effort: Pulmonary effort is normal. No respiratory distress.     Breath sounds: Normal breath sounds.  Abdominal:     Palpations: Abdomen is soft.     Tenderness: There is abdominal tenderness.  Musculoskeletal:        General: No deformity.     Cervical back: Neck supple.  Skin:    General: Skin is warm and dry.     Capillary Refill: Capillary refill takes less than 2 seconds.  Neurological:     General: No focal deficit present.     Mental Status: She is alert.     ED Results / Procedures / Treatments   Labs (all labs ordered are listed, but only abnormal results are displayed) Labs Reviewed  BASIC METABOLIC PANEL - Abnormal; Notable for the following components:      Result Value   Sodium 134 (*)    All other components within normal limits  URINALYSIS, ROUTINE W REFLEX MICROSCOPIC - Abnormal; Notable for the following components:   Color, Urine AMBER (*)    APPearance CLOUDY (*)    Hgb urine dipstick LARGE (*)    Protein, ur 100 (*)    RBC / HPF >50 (*)    WBC, UA >50 (*)    Bacteria, UA RARE (*)    All other components within normal limits  CBC  PROTIME-INR  HCG, SERUM, QUALITATIVE  POC URINE PREG, ED    EKG None  Radiology US Pelvis Complete  Result Date: 06/16/2022 CLINICAL DATA:  Pelvic pain, bilateral salpingectomy in April 2016. EXAM: TRANSABDOMINAL AND TRANSVAGINAL ULTRASOUND OF PELVIS DOPPLER ULTRASOUND OF OVARIES TECHNIQUE: Both transabdominal and transvaginal ultrasound examinations of the pelvis were performed. Transabdominal technique was performed for global imaging of the pelvis including uterus, ovaries, adnexal regions, and pelvic cul-de-sac. It was necessary to proceed with endovaginal exam following the transabdominal exam to visualize the endometrium and ovaries. Color and duplex Doppler ultrasound was utilized to evaluate blood flow to the ovaries. COMPARISON:  None Available. FINDINGS: Pulsed Doppler evaluation of both  ovaries demonstrates normal low-resistance arterial and venous waveforms. Other findings No abnormal free fluid. Measurements: 12.4 x 6.0 x 6.5 = volume: 254.8 mL. No fibroids or other mass visualized. Endometrium Thickness: 6 mm.  No focal abnormality visualized. Right ovary Measurements: 2.4 x 1.6 x 2.4 = volume: 4.7 mL. Normal appearance/no adnexal mass. Left ovary Measurements: 3.2 x 2.2 x 2.4 = volume: 8.7 mL. Normal appearance/no adnexal mass. IMPRESSION: No sonographic abnormality is seen in the pelvis. Electronically Signed   By: Larose Hires D.O.   On: 06/16/2022 17:11   US Transvaginal Non-OB  Result Date: 06/16/2022 CLINICAL DATA:  Pelvic pain, bilateral salpingectomy in April 2016. EXAM: TRANSABDOMINAL AND TRANSVAGINAL ULTRASOUND OF PELVIS DOPPLER ULTRASOUND OF OVARIES TECHNIQUE: Both transabdominal and transvaginal ultrasound examinations of the pelvis were performed. Transabdominal technique was performed for global imaging of the pelvis including uterus, ovaries, adnexal regions, and pelvic cul-de-sac. It was necessary to proceed with endovaginal exam following the transabdominal exam to visualize the endometrium and ovaries. Color and duplex Doppler ultrasound was utilized to evaluate blood flow to the ovaries. COMPARISON:  None Available. FINDINGS: Pulsed Doppler evaluation of both ovaries demonstrates normal low-resistance arterial and venous waveforms. Other findings No abnormal free fluid. Measurements: 12.4 x 6.0 x 6.5 = volume: 254.8 mL. No fibroids or other mass visualized. Endometrium Thickness: 6 mm.  No focal abnormality visualized. Right ovary Measurements: 2.4 x 1.6 x 2.4 = volume: 4.7 mL. Normal appearance/no adnexal mass. Left ovary Measurements: 3.2 x 2.2 x 2.4 = volume: 8.7 mL. Normal appearance/no adnexal mass. IMPRESSION: No sonographic abnormality is seen in the pelvis. Electronically Signed   By: Larose Hires D.O.   On: 06/16/2022 17:11   [place] Art/Ven Flow Abd Pelv  Doppler  Result Date: 06/16/2022 CLINICAL DATA:  Pelvic pain, bilateral salpingectomy in April 2016. EXAM: TRANSABDOMINAL AND TRANSVAGINAL ULTRASOUND OF PELVIS DOPPLER ULTRASOUND OF OVARIES TECHNIQUE: Both transabdominal and transvaginal ultrasound examinations of the pelvis were performed. Transabdominal technique was performed for global imaging of the pelvis including uterus, ovaries, adnexal regions, and pelvic cul-de-sac. It was necessary to proceed with endovaginal exam following the transabdominal exam to visualize the endometrium and ovaries. Color and duplex Doppler ultrasound was utilized to evaluate blood flow to the ovaries. COMPARISON:  None Available. FINDINGS: Pulsed Doppler evaluation of both ovaries demonstrates normal low-resistance arterial and venous waveforms. Other findings No abnormal free fluid. Measurements: 12.4 x 6.0 x 6.5 = volume: 254.8 mL. No fibroids or other mass visualized. Endometrium Thickness: 6 mm.  No focal abnormality visualized. Right ovary Measurements: 2.4 x 1.6 x 2.4 = volume: 4.7 mL. Normal appearance/no adnexal mass. Left ovary Measurements: 3.2 x 2.2 x 2.4 = volume: 8.7 mL. Normal appearance/no adnexal mass. IMPRESSION: No sonographic abnormality is seen in the pelvis. Electronically Signed   By: Larose Hires D.O.   On: 06/16/2022 17:11    Procedures Procedures    Medications Ordered in ED Medications - No data to display  ED Course/ Medical Decision Making/ A&P Clinical Course as of 06/17/22 1127  Tue Jun 16, 2022  1754 Discussed with GYN on-call Dr. Despina Hidden.  He is recommending megesterol 120 mg daily [MB]  1803 I updated patient on plan and she is in agreement. [MB]    Clinical Course User Index [MB] Terrilee Files, MD                           Medical Decision Making Amount and/or Complexity of Data Reviewed Labs: ordered. Radiology: ordered.  Risk Prescription drug management.   This patient complains of vaginal bleeding and pelvic pain;  this involves an extensive number of treatment Options and is a complaint that carries with it a high risk of complications and morbidity. The differential includes dysfunctional uterine bleeding, anemia, pregnancy, ectopic, uterine fibroids  I ordered, reviewed and interpreted labs, which included CBC with normal white count normal hemoglobin, chemistries normal, urinalysis with red blood cells white blood cells likely secondary to her menses, pregnancy test negative, INR normal I ordered imaging studies which included  pelvic ultrasound and I independently    visualized and interpreted imaging which showed no acute findings Previous records obtained and reviewed in epic no recent admissions I consulted gynecology Dr. Despina [facility] and discussed lab and imaging findings and discussed disposition.  Cardiac monitoring reviewed, normal sinus rhythm Social determinants considered, no significant barriers Critical Interventions: None  After the interventions stated above, I reevaluated the patient and found patient be hemodynamically stable in no distress Admission and further testing considered, no indications for admission or further workup at this time.  Will start her on hormonal treatment and recommended close follow-up with gynecology.  Return instructions discussed.         Final Clinical Impression(s) / ED Diagnoses Final diagnoses:  Abnormal uterine bleeding (AUB)    Rx / DC Orders ED Discharge Orders          Ordered    megestrol (MEGACE) 40 MG tablet  Daily        06/16/22 1756              Terrilee Files, MD 06/17/22 1129

## 2022-06-16 NOTE — Discharge Instructions (Addendum)
You are seen in the emergency department for continued vaginal bleeding and pelvic pain.  You had lab work and an ultrasound that did not show any obvious explanation for your bleeding.  Dr. Elonda Husky is recommending we start you on some medication to help control the bleeding.  He also wants to make an appointment with [facility] for 2 weeks from now.  Return to the emergency department or follow-up with GYN sooner if any problems.

## 2022-06-16 NOTE — Telephone Encounter (Signed)
Patient called and stated that when she was seen in [place] ([facility]) on 06/05/2022, she was having ongoing bleeding/spotting x 3 months that has  now increased to heavy bleeding with blood clots. Patient is currently wearing 2 overnight pads, and tampon, is saturating both every 1-2 hours, stabing sharp lower abdominal pain x 4 days. Pain is becoming unbearable, thinks may need to go to ED. Patient states that she feels worn out, co-workers have noted dark spots underneath her eyes. Patient states she has history of ovarian/cervical cyst. I spoke with [facility], patient was scheduled for 06/19/2022 @ 10:30 am, but may need to go to ED to get any ultrasounds, labs/CBC, possible blood transfusion, if needed. Patient informed appointment information, and to go to ED. Patient stated that she will check with her supervisor to see if she can leave to go ED.

## 2022-06-18 ENCOUNTER — Ambulatory Visit (HOSPITAL_COMMUNITY)
Admission: RE | Admit: 2022-06-18 | Discharge: 2022-06-18 | Disposition: A | Discharge status: Home / Self Care | Payer: Self-pay | Source: Ambulatory Visit | Attending: Obstetrics and Gynecology | Admitting: Obstetrics and Gynecology

## 2022-06-18 DIAGNOSIS — N644 Mastodynia: Secondary | ICD-10-CM | POA: Insufficient documentation

## 2022-06-19 ENCOUNTER — Encounter: Payer: Self-pay | Admitting: Family Medicine

## 2022-06-19 ENCOUNTER — Telehealth: Payer: Self-pay

## 2022-06-19 NOTE — Telephone Encounter (Signed)
Left message @ 1:00 pm. JSY

## 2022-06-19 NOTE — Telephone Encounter (Signed)
Pt was seen in ER on Tuesday. Period has been on and off for months. Everything looked ok. Pt was prescribed Megace. She is taking 3 tabs once a day. Bleeding has decreased a little. ER doc told to call if hasn't stopped bleeding in 2 days. Rash on neck; noticed this am. Doesn't itch. Does have a burning sensation. I spoke with Dr. Elonda Husky. It can take some time for bleeding to stop, even with Megace. If rash gets worse, can let us know. Keep appt for 07/01/22. Pt voiced understanding. [place]

## 2022-06-19 NOTE — Telephone Encounter (Signed)
Patient called and stated that she was seen in the ER and Dr. Elonda Husky consulted with the ER doctor by phone and she was prescribed some medication and it is not helping. Would like something else.  She is a new patient, her first appointment is July 01, 2022.

## 2022-06-23 ENCOUNTER — Other Ambulatory Visit: Payer: Self-pay

## 2022-06-23 ENCOUNTER — Emergency Department (HOSPITAL_COMMUNITY)
Admission: EM | Admit: 2022-06-23 | Discharge: 2022-06-23 | Disposition: A | Discharge status: Home / Self Care | Payer: Self-pay | Attending: Emergency Medicine | Admitting: Emergency Medicine

## 2022-06-23 DIAGNOSIS — J069 Acute upper respiratory infection, unspecified: Secondary | ICD-10-CM | POA: Insufficient documentation

## 2022-06-23 DIAGNOSIS — H6690 Otitis media, unspecified, unspecified ear: Secondary | ICD-10-CM | POA: Insufficient documentation

## 2022-06-23 DIAGNOSIS — Z1152 Encounter for screening for COVID-19: Secondary | ICD-10-CM | POA: Insufficient documentation

## 2022-06-23 LAB — RESP PANEL BY RT-PCR (RSV, FLU A&B, COVID)  RVPGX2
Influenza A by PCR: NEGATIVE
Influenza B by PCR: NEGATIVE
Resp Syncytial Virus by PCR: NEGATIVE
SARS Coronavirus 2 by RT PCR: NEGATIVE

## 2022-06-23 LAB — GROUP A STREP BY PCR: Group A Strep by PCR: NOT DETECTED

## 2022-06-23 MED ORDER — DEXAMETHASONE 4 MG PO TABS
10.0000 mg | ORAL_TABLET | Freq: Once | ORAL | Status: AC
  Administered 2022-06-23: 10 mg via ORAL
  Filled 2022-06-23: qty 3

## 2022-06-23 MED ORDER — CEFDINIR 300 MG PO CAPS: 300.0000 mg | ORAL_CAPSULE | Freq: Two times a day (BID) | ORAL | 0 refills | Status: DC

## 2022-06-23 MED ORDER — LIDOCAINE VISCOUS HCL 2 % MT SOLN
15.0000 mL | Freq: Once | OROMUCOSAL | Status: AC
  Administered 2022-06-23: 15 mL via OROMUCOSAL
  Filled 2022-06-23: qty 15

## 2022-06-23 NOTE — ED Provider Notes (Signed)
[facility] EMERGENCY DEPARTMENT Provider Note   CSN: [id number] Arrival date & time: 06/23/22  1804     History  Chief Complaint  Patient presents with   Sore Throat    Wendy Singh is a 47 y.o. female.   Sore Throat   47 year old female presents emergency department with complaints of cough, congestion, sore throat, increasing voice, ear pain.  Patient reports symptoms beginning approximately 2 days ago and worsening since onset.  Has taken at home Mucinex, NyQuil/DayQuil, ibuprofen, "throat spray" with some relief of symptoms.  Denies any known sick exposure.  Denies fever, chills, chest pain, shortness of breath, feelings of throat closing on her, difficulty swallowing, abdominal pain, nausea, vomiting, urinary symptoms, change in bowel habits.  Past medical history significant for hypertension, asthma, anemia, ovarian cyst  Home Medications Prior to Admission medications   Medication Sig Start Date End Date Taking? Authorizing Provider  cefdinir (OMNICEF) 300 MG capsule Take 1 capsule (300 mg total) by mouth 2 (two) times daily. 06/23/22  Yes Dion Saucier A, PA  acetaminophen (TYLENOL) 500 MG tablet Take 1,000 mg by mouth every 6 (six) hours as needed for moderate pain.    [provider]  ascorbic acid (VITAMIN C) 500 MG tablet Take 500 mg by mouth daily.    [provider]  ibuprofen (ADVIL) 200 MG tablet Take 800 mg by mouth every 6 (six) hours as needed for mild pain or moderate pain.    [provider]  megestrol (MEGACE) 40 MG tablet Take 3 tablets (120 mg total) by mouth daily. 06/16/22   Hayden Rasmussen, MD  pantoprazole (PROTONIX) 20 MG tablet Take 1 tablet (20 mg total) by mouth daily. 01/29/21   Sherrill Raring, PA-C  PROVENTIL HFA 108 (90 Base) MCG/ACT inhaler INHALE 2 PUFFS BY MOUTH EVERY 6 HOURS AS NEEDED FOR WHEEZING OR SHORTNESS OF BREATH Patient taking differently: Inhale 2 puffs into the lungs every 6 (six) hours as needed. 10/17/19    Soyla Dryer, PA-C  traMADol (ULTRAM) 50 MG tablet Take 50 mg by mouth every 6 (six) hours as needed for moderate pain.    [provider]  Zinc 50 MG TABS Take 1 tablet by mouth every other day.    [provider]      Allergies    Penicillins    Review of Systems   Review of Systems  All other systems reviewed and are negative.   Physical Exam Updated Vital Signs BP (!) 117/104 (BP Location: Right Arm)   Pulse 82   Temp 98.4 F (36.9 C) (Oral)   Resp 18   LMP 06/05/2022 (Exact Date)   SpO2 100%  Physical Exam Vitals and nursing note reviewed.  Constitutional:      General: She is not in acute distress.    Appearance: She is well-developed.  HENT:     Head: Normocephalic and atraumatic.     Left Ear: A middle ear effusion is present. Tympanic membrane is erythematous.     Ears:     Comments: Erythematous left-sided temporal membrane with purulent fluid noted.    Nose: Congestion and rhinorrhea present.     Mouth/Throat:     Comments: Moderate posterior pharyngeal erythema noted.  Tonsils are 1+ bilaterally with no obvious exudate.  No obvious auscultated stridor. Eyes:     Conjunctiva/sclera: Conjunctivae normal.  Cardiovascular:     Rate and Rhythm: Normal rate and regular rhythm.     Heart sounds: No murmur heard.  Pulmonary:     Effort: Pulmonary effort is normal. No respiratory distress.     Breath sounds: Normal breath sounds. No wheezing, rhonchi or rales.  Abdominal:     Palpations: Abdomen is soft.     Tenderness: There is no abdominal tenderness.  Musculoskeletal:        General: No swelling.     Cervical back: Neck supple. No rigidity or tenderness.  Skin:    General: Skin is warm and dry.     Capillary Refill: Capillary refill takes less than 2 seconds.  Neurological:     Mental Status: She is alert.  Psychiatric:        Mood and Affect: Mood normal.     ED Results / Procedures / Treatments   Labs (all labs ordered are  listed, but only abnormal results are displayed) Labs Reviewed  RESP PANEL BY RT-PCR (RSV, FLU A&B, COVID)  RVPGX2  GROUP A STREP BY PCR    EKG None  Radiology No results found.  Procedures Procedures    Medications Ordered in ED Medications  lidocaine (XYLOCAINE) 2 % viscous mouth solution 15 mL (15 mLs Mouth/Throat Given 06/23/22 1915)  dexamethasone (DECADRON) tablet 10 mg (10 mg Oral Given 06/23/22 1915)    ED Course/ Medical Decision Making/ A&P                           Medical Decision Making Risk Prescription drug management.   This patient presents to the ED for concern of sore throat/influenza-like illness, this involves an extensive number of treatment options, and is a complaint that carries with it a high risk of complications and morbidity.  The differential diagnosis includes influenza, COVID, RSV, pneumonia, strep pharyngitis, laryngitis, Ludwig angina, necrotizing ulcerative gingivitis, anaphylaxis   Co morbidities that complicate the patient evaluation  See HPI   Additional history obtained:  Additional history obtained from EMR External records from outside source obtained and reviewed including hospital records   Lab Tests:  I Ordered, and personally interpreted labs.  The pertinent results include: Negative respiratory viral panel.  Negative strep   Imaging Studies ordered:  N/a   Cardiac Monitoring: / EKG:  The patient was maintained on a cardiac monitor.  I personally viewed and interpreted the cardiac monitored which showed an underlying rhythm of: Sinus rhythm   Consultations Obtained:  N/a   Problem List / ED Course / Critical interventions / Medication management  URI with cough I ordered medication including Decadron and lidocaine   Reevaluation of the patient after these medicines showed that the patient improved I have reviewed the patients home medicines and have made adjustments as needed   Social Determinants of  Health:  Chronic cigarette use.  Denies illicit drug use.   Test / Admission - Considered:  URI with cough Vitals signs within normal range and stable throughout visit. Laboratory studies significant for: See above Patient with evidence of viral URI with cough.  Group a strep test negative as well as respiratory viral panel.  Patient no acute respiratory distress with no signs of anaphylaxis, Ludwig angina, peritonsillar abscess, periapical abscess.  No obvious auscultatory stridor.  Most likely viral pharyngitis.  Will treat symptomatically with Cepacol throat lozenges, nasal steroid spray, antihistamine, Motrin/Tylenol.  Patient also provided antibiotics for current left-sided otitis media.  Recommend follow-up with primary care for reassessment within the week.  Treatment plan discussed at length with patient and she acknowledged understanding was agreeable  to said plan. Worrisome signs and symptoms were discussed with the patient, and the patient acknowledged understanding to return to the ED if noticed. Patient was stable upon discharge.          Final Clinical Impression(s) / ED Diagnoses Final diagnoses:  Acute otitis media, unspecified otitis media type  Viral URI with cough    Rx / DC Orders ED Discharge Orders          Ordered    cefdinir (OMNICEF) 300 MG capsule  2 times daily        06/23/22 1942              Wilnette Kales, [place] 06/23/22 1958    Fredia Sorrow, MD 06/26/22 2044

## 2022-06-23 NOTE — ED Triage Notes (Signed)
Pt with sore throat, cough, and congestion for 2 days. Unable to speak well today.  States throat soreness is exponentially worse today and feels "tight"  Pt also endorses ear pain. States she was sent home from work today d/t not being able to speak well enough to be understood.

## 2022-06-23 NOTE — Discharge Instructions (Signed)
The workup today was overall reassuring.  Tested negative for COVID, influenza, RSV, strep.  Given evidence of current ear infection, will treat with antibiotics.  Continue symptomatic therapy outpatient with Motrin as needed for pain/fever, allergy medicine in the form of Zyrtec/Claritin/Allegra, nasal steroid spray in the form of Flonase/Nasacort, Cepacol throat lozenges of which he can find over-the-counter.  Antibiotics was to be sent into your local pharmacy.  Please do not hesitate to return to emergency department for worrisome symptoms we discussed become apparent.

## 2022-07-01 ENCOUNTER — Encounter: Payer: Self-pay | Admitting: Family Medicine

## 2022-07-09 ENCOUNTER — Telehealth: Payer: Self-pay

## 2022-07-14 ENCOUNTER — Other Ambulatory Visit: Payer: Self-pay | Admitting: Obstetrics & Gynecology

## 2022-07-14 ENCOUNTER — Telehealth: Payer: Self-pay | Admitting: Obstetrics & Gynecology

## 2022-07-14 ENCOUNTER — Encounter: Payer: Self-pay | Admitting: Obstetrics & Gynecology

## 2022-07-14 MED ORDER — MEGESTROL ACETATE 40 MG PO TABS: 120.0000 mg | ORAL_TABLET | Freq: Every day | ORAL | 11 refills | Status: DC

## 2022-07-14 NOTE — Telephone Encounter (Signed)
LMOVM returning patient's call.  

## 2022-07-14 NOTE — Telephone Encounter (Signed)
Patient requesting a refill on Megace until she is able to be seen on 2/20 be sent to [facility].

## 2022-07-14 NOTE — Telephone Encounter (Signed)
We had to reschedule patient's appointment due to Dr. Elonda Husky being in surgery with [facility] this morning and patient is asking if Dr. Elonda Husky could send in enough medicine to last her to her appointment 08/11/22

## 2022-07-21 ENCOUNTER — Encounter: Payer: Self-pay | Admitting: Obstetrics & Gynecology

## 2022-08-04 ENCOUNTER — Ambulatory Visit (INDEPENDENT_AMBULATORY_CARE_PROVIDER_SITE_OTHER): Payer: Self-pay | Admitting: Obstetrics & Gynecology

## 2022-08-04 ENCOUNTER — Encounter: Payer: Self-pay | Admitting: Obstetrics & Gynecology

## 2022-08-04 VITALS — BP 136/99 | HR 77 | Ht 71.0 in | Wt 243.0 lb

## 2022-08-04 DIAGNOSIS — N852 Hypertrophy of uterus: Secondary | ICD-10-CM

## 2022-08-04 DIAGNOSIS — N921 Excessive and frequent menstruation with irregular cycle: Secondary | ICD-10-CM

## 2022-08-04 DIAGNOSIS — N946 Dysmenorrhea, unspecified: Secondary | ICD-10-CM

## 2022-08-04 DIAGNOSIS — N938 Other specified abnormal uterine and vaginal bleeding: Secondary | ICD-10-CM

## 2022-08-04 DIAGNOSIS — N941 Unspecified dyspareunia: Secondary | ICD-10-CM

## 2022-08-04 MED ORDER — MEGESTROL ACETATE 40 MG PO TABS: ORAL_TABLET | ORAL | 3 refills | Status: DC

## 2022-08-04 NOTE — Progress Notes (Signed)
Chief Complaint  Patient presents with   Abnormal uterine bleeding    Pain w/ intercourse-Taking megace but having side effects-insomnia, urinating more, increase in appetite, irritable      47 y.o. [id number] No LMP recorded. The current method of family planning is tubal ligation.  Outpatient Encounter Medications as of 08/04/2022  Medication Sig Note   acetaminophen (TYLENOL) 500 MG tablet Take 1,000 mg by mouth every 6 (six) hours as needed for moderate pain.    ascorbic acid (VITAMIN C) 500 MG tablet Take 500 mg by mouth daily.    ibuprofen (ADVIL) 200 MG tablet Take 800 mg by mouth every 6 (six) hours as needed for mild pain or moderate pain.    megestrol (MEGACE) 40 MG tablet 3 tablets a day for 5 days, 2 tablets a day for 5 days then 1 tablet daily    pantoprazole (PROTONIX) 20 MG tablet Take 1 tablet (20 mg total) by mouth daily.    PROVENTIL HFA 108 (90 Base) MCG/ACT inhaler INHALE 2 PUFFS BY MOUTH EVERY 6 HOURS AS NEEDED FOR WHEEZING OR SHORTNESS OF BREATH (Patient taking differently: Inhale 2 puffs into the lungs every 6 (six) hours as needed.)    [DISCONTINUED] megestrol (MEGACE) 40 MG tablet Take 3 tablets (120 mg total) by mouth daily.    traMADol (ULTRAM) 50 MG tablet Take 50 mg by mouth every 6 (six) hours as needed for moderate pain. (Patient not taking: Reported on 08/04/2022) 06/16/2022: Pt's sister gave her this medication to see if it would improve pt's pain.   Zinc 50 MG TABS Take 1 tablet by mouth every other day. (Patient not taking: Reported on 08/04/2022)    [DISCONTINUED] cefdinir (OMNICEF) 300 MG capsule Take 1 capsule (300 mg total) by mouth 2 (two) times daily. (Patient not taking: Reported on 08/04/2022)    No facility-administered encounter medications on file as of 08/04/2022.    Subjective Periods messed up for years Bleeds for 10-14 days at times For the last 6 months almost daily Stopped on megestrol Has severe cramping with bleeding 25% deep  dyspareunia(bump) History of HSIL S/P CKC of the cervix  Past Medical History:  Diagnosis Date   Anemia    Asthma    Depression    Family history of adverse reaction to anesthesia    Hypertension    Kidney stones    Migraines    Ovarian cyst    Vaginal Pap smear, abnormal     Past Surgical History:  Procedure Laterality Date   BIOPSY  01/31/2021   Procedure: BIOPSY;  Surgeon: Daryel November, MD;  Location: [place];  Service: Gastroenterology;;   BREAST BIOPSY  05/2013   BREAST LUMPECTOMY WITH RADIOACTIVE SEED LOCALIZATION Left 05/28/2021   Procedure: LEFT BREAST LUMPECTOMY WITH RADIOACTIVE SEED LOCALIZATION X2;  Surgeon: Coralie Keens, MD;  Location: [place];  Service: General;  Laterality: Left;   CERVICAL BIOPSY  W/ LOOP ELECTRODE EXCISION     CERVICAL CONIZATION W/BX N/A 09/26/2014   Procedure: CONIZATION CERVIX WITH BIOPSY;  Surgeon: Emily Filbert, MD;  Location: [facility];  Service: Gynecology;  Laterality: N/A;   DIAGNOSTIC LAPAROSCOPY     ESOPHAGOGASTRODUODENOSCOPY (EGD) WITH PROPOFOL N/A 01/31/2021   Procedure: ESOPHAGOGASTRODUODENOSCOPY (EGD) WITH PROPOFOL;  Surgeon: Daryel November, MD;  Location: [place];  Service: Gastroenterology;  Laterality: N/A;   LAPAROSCOPIC BILATERAL SALPINGECTOMY Bilateral 09/26/2014   Procedure: LAPAROSCOPIC BILATERAL SALPINGECTOMY;  Surgeon: Emily Filbert, MD;  Location:  [facility];  Service: Gynecology;  Laterality: Bilateral;    OB History     Gravida  3   Para  2   Term  2   Preterm      AB  1   Living  2      SAB  1   IAB      Ectopic      Multiple      Live Births  2           Allergies  Allergen Reactions   Penicillins Rash    Has patient had a PCN reaction causing immediate rash, facial/tongue/throat swelling, SOB or lightheadedness with hypotension: Yes Has patient had a PCN reaction causing severe rash involving mucus membranes or skin necrosis: Yes Has patient had a PCN  reaction that required hospitalization: No Has patient had a PCN reaction occurring within the last 10 years: Yes If all of the above answers are "NO", then may proceed with Cephalosporin use.     Social History   Socioeconomic History   Marital status: Single    Spouse name: Not on file   Number of children: 2   Years of education: Not on file   Highest education level: Some college, no degree  Occupational History   Not on file  Tobacco Use   Smoking status: Every Day    Packs/day: 0.25    Years: 17.00    Total pack years: 4.25    Types: Cigarettes   Smokeless tobacco: Never  Vaping Use   Vaping Use: Never used  Substance and Sexual Activity   Alcohol use: Yes    Comment: occasional   Drug use: Yes    Types: Marijuana   Sexual activity: Not Currently    Birth control/protection: Surgical    Comment: tubal  Other Topics Concern   Not on file  Social History Narrative   Not on file   Social Determinants of Health   Financial Resource Strain: Medium Risk (08/04/2022)   Overall Financial Resource Strain (CARDIA)    Difficulty of Paying Living Expenses: Somewhat hard  Food Insecurity: No Food Insecurity (08/04/2022)   Hunger Vital Sign    Worried About Running Out of Food in the Last Year: Never true    Ran Out of Food in the Last Year: Never true  Transportation Needs: No Transportation Needs (06/05/2022)   PRAPARE - Hydrologist (Medical): No    Lack of Transportation (Non-Medical): No  Physical Activity: Inactive (08/04/2022)   Exercise Vital Sign    Days of Exercise per Week: 0 days    Minutes of Exercise per Session: 0 min  Stress: Stress Concern Present (08/04/2022)   [place]    Feeling of Stress : To some extent  Social Connections: Moderately Isolated (08/04/2022)   Social Connection and Isolation Panel [NHANES]    Frequency of Communication with Friends and  Family: More than three times a week    Frequency of Social Gatherings with Friends and Family: Twice a week    Attends Religious Services: 1 to 4 times per year    Active Member of [employer] or Organizations: No    Attends Archivist Meetings: Never    Marital Status: Never married    Family History  Problem Relation Age of Onset   Heart disease Paternal Grandfather    Heart disease Paternal Grandmother    Heart disease Maternal Grandmother  Hypertension Father    Heart disease Father    Cancer Father        throat cancer   Heart disease Mother    Cancer Mother        ovarian cancer   Hypertension Mother    Diabetes Brother     Medications:       Current Outpatient Medications:    acetaminophen (TYLENOL) 500 MG tablet, Take 1,000 mg by mouth every 6 (six) hours as needed for moderate pain., Disp: , Rfl:    ascorbic acid (VITAMIN C) 500 MG tablet, Take 500 mg by mouth daily., Disp: , Rfl:    ibuprofen (ADVIL) 200 MG tablet, Take 800 mg by mouth every 6 (six) hours as needed for mild pain or moderate pain., Disp: , Rfl:    megestrol (MEGACE) 40 MG tablet, 3 tablets a day for 5 days, 2 tablets a day for 5 days then 1 tablet daily, Disp: 45 tablet, Rfl: 3   pantoprazole (PROTONIX) 20 MG tablet, Take 1 tablet (20 mg total) by mouth daily., Disp: 90 tablet, Rfl: 0   PROVENTIL HFA 108 (90 Base) MCG/ACT inhaler, INHALE 2 PUFFS BY MOUTH EVERY 6 HOURS AS NEEDED FOR WHEEZING OR SHORTNESS OF BREATH (Patient taking differently: Inhale 2 puffs into the lungs every 6 (six) hours as needed.), Disp: 20.1 g, Rfl: 1   traMADol (ULTRAM) 50 MG tablet, Take 50 mg by mouth every 6 (six) hours as needed for moderate pain. (Patient not taking: Reported on 08/04/2022), Disp: , Rfl:    Zinc 50 MG TABS, Take 1 tablet by mouth every other day. (Patient not taking: Reported on 08/04/2022), Disp: , Rfl:   Objective Blood pressure (!) 136/99, pulse 77, height 5' 11"$  (1.803 m), weight 243 lb (110.2  kg).  General WDWN female NAD Vulva:  normal appearing vulva with no masses, tenderness or lesions Vagina:  normal mucosa, no discharge Cervix:  Normal no lesions Uterus:  10 week size on exam, mobile, ice cream cone, contour, position, consistency, mobility, non-tender Adnexa: ovaries:present,  normal adnexa in size, nontender and no masses   Pertinent ROS No burning with urination, frequency or urgency No nausea, vomiting or diarrhea Nor fever chills or other constitutional symptoms   Labs or studies Reviewed labs and scan  MS DIGITAL DIAG TOMO BILAT  Result Date: 06/18/2022 CLINICAL DATA:  Patient reports focal area prominence and tenderness in upper right breast that waxes and wanes with menstrual cycle. She also reports focal tenderness at the location a benign excision site, excision performed on 05/28/2021. EXAM: DIGITAL DIAGNOSTIC BILATERAL MAMMOGRAM WITH TOMOSYNTHESIS; ULTRASOUND LEFT BREAST LIMITED; ULTRASOUND RIGHT BREAST LIMITED TECHNIQUE: Bilateral digital diagnostic mammography and breast tomosynthesis was performed.; Targeted ultrasound examination of the left breast was performed.; Targeted ultrasound examination of the right breast was performed COMPARISON:  Previous exam(s). ACR Breast Density Category b: There are scattered areas of fibroglandular density. FINDINGS: Focal area of asymmetric fibroglandular tissue is noted in the upper right breast corresponding to the area waxing and waning tenderness prominence. This is stable prior imaging. Subtle postsurgical changes are noted in the upper outer left breast on the recent benign excision. There are no masses or areas of nonsurgical architectural distortion. There are no suspicious calcifications. Targeted right breast ultrasound is performed, showing normal fibroglandular tissue in the area of the reported focal prominence and tenderness, consistent with a finding mammographically. No mass or suspicious lesion. Targeted left  breast ultrasound is performed, showing normal tissue the area  of focal pain and surgical scar. No mass or suspicious lesion. IMPRESSION: 1. No evidence of breast malignancy. 2. Stable area of benign focal fibroglandular tissue in the upper right breast. Stable subtle changes from the recent benign left breast excision. RECOMMENDATION: Screening mammogram in one year.(Code:SM-B-01Y) I have discussed the findings and recommendations with the patient. If applicable, a reminder letter will be sent to the patient regarding the next appointment. BI-RADS CATEGORY  2: Benign. Electronically Signed   By: Lajean Manes M.D.   On: 06/18/2022 14:19   US BREAST LTD UNI LEFT INC AXILLA  Result Date: 06/18/2022 CLINICAL DATA:  Patient reports focal area prominence and tenderness in upper right breast that waxes and wanes with menstrual cycle. She also reports focal tenderness at the location a benign excision site, excision performed on 05/28/2021. EXAM: DIGITAL DIAGNOSTIC BILATERAL MAMMOGRAM WITH TOMOSYNTHESIS; ULTRASOUND LEFT BREAST LIMITED; ULTRASOUND RIGHT BREAST LIMITED TECHNIQUE: Bilateral digital diagnostic mammography and breast tomosynthesis was performed.; Targeted ultrasound examination of the left breast was performed.; Targeted ultrasound examination of the right breast was performed COMPARISON:  Previous exam(s). ACR Breast Density Category b: There are scattered areas of fibroglandular density. FINDINGS: Focal area of asymmetric fibroglandular tissue is noted in the upper right breast corresponding to the area waxing and waning tenderness prominence. This is stable prior imaging. Subtle postsurgical changes are noted in the upper outer left breast on the recent benign excision. There are no masses or areas of nonsurgical architectural distortion. There are no suspicious calcifications. Targeted right breast ultrasound is performed, showing normal fibroglandular tissue in the area of the reported focal prominence  and tenderness, consistent with a finding mammographically. No mass or suspicious lesion. Targeted left breast ultrasound is performed, showing normal tissue the area of focal pain and surgical scar. No mass or suspicious lesion. IMPRESSION: 1. No evidence of breast malignancy. 2. Stable area of benign focal fibroglandular tissue in the upper right breast. Stable subtle changes from the recent benign left breast excision. RECOMMENDATION: Screening mammogram in one year.(Code:SM-B-01Y) I have discussed the findings and recommendations with the patient. If applicable, a reminder letter will be sent to the patient regarding the next appointment. BI-RADS CATEGORY  2: Benign. Electronically Signed   By: Lajean Manes M.D.   On: 06/18/2022 14:19   US BREAST LTD UNI RIGHT INC AXILLA  Result Date: 06/18/2022 CLINICAL DATA:  Patient reports focal area prominence and tenderness in upper right breast that waxes and wanes with menstrual cycle. She also reports focal tenderness at the location a benign excision site, excision performed on 05/28/2021. EXAM: DIGITAL DIAGNOSTIC BILATERAL MAMMOGRAM WITH TOMOSYNTHESIS; ULTRASOUND LEFT BREAST LIMITED; ULTRASOUND RIGHT BREAST LIMITED TECHNIQUE: Bilateral digital diagnostic mammography and breast tomosynthesis was performed.; Targeted ultrasound examination of the left breast was performed.; Targeted ultrasound examination of the right breast was performed COMPARISON:  Previous exam(s). ACR Breast Density Category b: There are scattered areas of fibroglandular density. FINDINGS: Focal area of asymmetric fibroglandular tissue is noted in the upper right breast corresponding to the area waxing and waning tenderness prominence. This is stable prior imaging. Subtle postsurgical changes are noted in the upper outer left breast on the recent benign excision. There are no masses or areas of nonsurgical architectural distortion. There are no suspicious calcifications. Targeted right breast  ultrasound is performed, showing normal fibroglandular tissue in the area of the reported focal prominence and tenderness, consistent with a finding mammographically. No mass or suspicious lesion. Targeted left breast ultrasound is performed, showing normal tissue  the area of focal pain and surgical scar. No mass or suspicious lesion. IMPRESSION: 1. No evidence of breast malignancy. 2. Stable area of benign focal fibroglandular tissue in the upper right breast. Stable subtle changes from the recent benign left breast excision. RECOMMENDATION: Screening mammogram in one year.(Code:[id number]) I have discussed the findings and recommendations with the patient. If applicable, a reminder letter will be sent to the patient regarding the next appointment. BI-RADS CATEGORY  2: Benign. Electronically Signed   By: Lajean Manes M.D.   On: 06/18/2022 14:19  US Pelvis Complete  Result Date: 06/16/2022 CLINICAL DATA:  Pelvic pain, bilateral salpingectomy in April 2016. EXAM: TRANSABDOMINAL AND TRANSVAGINAL ULTRASOUND OF PELVIS DOPPLER ULTRASOUND OF OVARIES TECHNIQUE: Both transabdominal and transvaginal ultrasound examinations of the pelvis were performed. Transabdominal technique was performed for global imaging of the pelvis including uterus, ovaries, adnexal regions, and pelvic cul-de-sac. It was necessary to proceed with endovaginal exam following the transabdominal exam to visualize the endometrium and ovaries. Color and duplex Doppler ultrasound was utilized to evaluate blood flow to the ovaries. COMPARISON:  None Available. FINDINGS: Pulsed Doppler evaluation of both ovaries demonstrates normal low-resistance arterial and venous waveforms. Other findings No abnormal free fluid. Measurements: 12.4 x 6.0 x 6.5 = volume: 254.8 mL. No fibroids or other mass visualized. Endometrium Thickness: 6 mm.  No focal abnormality visualized. Right ovary Measurements: 2.4 x 1.6 x 2.4 = volume: 4.7 mL. Normal appearance/no adnexal mass.  Left ovary Measurements: 3.2 x 2.2 x 2.4 = volume: 8.7 mL. Normal appearance/no adnexal mass. IMPRESSION: No sonographic abnormality is seen in the pelvis. Electronically Signed   By: Keane Police D.O.   On: 06/16/2022 17:11   US Transvaginal Non-OB  Result Date: 06/16/2022 CLINICAL DATA:  Pelvic pain, bilateral salpingectomy in April 2016. EXAM: TRANSABDOMINAL AND TRANSVAGINAL ULTRASOUND OF PELVIS DOPPLER ULTRASOUND OF OVARIES TECHNIQUE: Both transabdominal and transvaginal ultrasound examinations of the pelvis were performed. Transabdominal technique was performed for global imaging of the pelvis including uterus, ovaries, adnexal regions, and pelvic cul-de-sac. It was necessary to proceed with endovaginal exam following the transabdominal exam to visualize the endometrium and ovaries. Color and duplex Doppler ultrasound was utilized to evaluate blood flow to the ovaries. COMPARISON:  None Available. FINDINGS: Pulsed Doppler evaluation of both ovaries demonstrates normal low-resistance arterial and venous waveforms. Other findings No abnormal free fluid. Measurements: 12.4 x 6.0 x 6.5 = volume: 254.8 mL. No fibroids or other mass visualized. Endometrium Thickness: 6 mm.  No focal abnormality visualized. Right ovary Measurements: 2.4 x 1.6 x 2.4 = volume: 4.7 mL. Normal appearance/no adnexal mass. Left ovary Measurements: 3.2 x 2.2 x 2.4 = volume: 8.7 mL. Normal appearance/no adnexal mass. IMPRESSION: No sonographic abnormality is seen in the pelvis. Electronically Signed   By: Keane Police D.O.   On: 06/16/2022 17:11   [place] Art/Ven Flow Abd Pelv Doppler  Result Date: 06/16/2022 CLINICAL DATA:  Pelvic pain, bilateral salpingectomy in April 2016. EXAM: TRANSABDOMINAL AND TRANSVAGINAL ULTRASOUND OF PELVIS DOPPLER ULTRASOUND OF OVARIES TECHNIQUE: Both transabdominal and transvaginal ultrasound examinations of the pelvis were performed. Transabdominal technique was performed for global imaging of the pelvis  including uterus, ovaries, adnexal regions, and pelvic cul-de-sac. It was necessary to proceed with endovaginal exam following the transabdominal exam to visualize the endometrium and ovaries. Color and duplex Doppler ultrasound was utilized to evaluate blood flow to the ovaries. COMPARISON:  None Available. FINDINGS: Pulsed Doppler evaluation of both ovaries demonstrates normal low-resistance arterial and venous waveforms. Other  findings No abnormal free fluid. Measurements: 12.4 x 6.0 x 6.5 = volume: 254.8 mL. No fibroids or other mass visualized. Endometrium Thickness: 6 mm.  No focal abnormality visualized. Right ovary Measurements: 2.4 x 1.6 x 2.4 = volume: 4.7 mL. Normal appearance/no adnexal mass. Left ovary Measurements: 3.2 x 2.2 x 2.4 = volume: 8.7 mL. Normal appearance/no adnexal mass. IMPRESSION: No sonographic abnormality is seen in the pelvis. Electronically Signed   By: Keane Police D.O.   On: 06/16/2022 17:11   DG Chest 2 View  Result Date: 05/24/2022 CLINICAL DATA:  Shortness of breath EXAM: CHEST - 2 VIEW COMPARISON:  06/17/21 chest x-ray FINDINGS: No pleural effusion. No pneumothorax. Unchanged cardiac and mediastinal contours. No displaced rib fractures. Visualized upper abdomen is unremarkable. Vertebral body heights are maintained. IMPRESSION: No active cardiopulmonary disease. Electronically Signed   By: Marin Roberts M.D.   On: 05/24/2022 16:30       Impression + Management Plan: Diagnoses this Encounter::   ICD-10-CM   1. Menometrorrhagia  N92.1     2. DUB (dysfunctional uterine bleeding)  N93.8     3. Dysmenorrhea  N94.6     4. Dyspareunia in female, 25%, deep  N94.10     5. Bulky or enlarged uterus, 250 grams, ice cream type  N85.2         Medications prescribed during  this encounter: Meds ordered this encounter  Medications   megestrol (MEGACE) 40 MG tablet    Sig: 3 tablets a day for 5 days, 2 tablets a day for 5 days then 1 tablet daily    Dispense:  45  tablet    Refill:  3    Labs or Scans Ordered during this encounter: No orders of the defined types were placed in this encounter.     Follow up Recommend robotic assisted hysterectomy, preservation of ovaries 09/23/22

## 2022-08-12 ENCOUNTER — Encounter: Payer: Self-pay | Admitting: Adult Health

## 2022-08-12 NOTE — Telephone Encounter (Signed)
complete

## 2022-08-24 ENCOUNTER — Emergency Department (HOSPITAL_COMMUNITY)
Admission: EM | Admit: 2022-08-24 | Discharge: 2022-08-24 | Disposition: A | Discharge status: Home / Self Care | Payer: Self-pay | Attending: Emergency Medicine | Admitting: Emergency Medicine

## 2022-08-24 ENCOUNTER — Emergency Department (HOSPITAL_COMMUNITY): Payer: Self-pay

## 2022-08-24 ENCOUNTER — Other Ambulatory Visit: Payer: Self-pay

## 2022-08-24 ENCOUNTER — Encounter (HOSPITAL_COMMUNITY): Payer: Self-pay | Admitting: Emergency Medicine

## 2022-08-24 DIAGNOSIS — I10 Essential (primary) hypertension: Secondary | ICD-10-CM | POA: Insufficient documentation

## 2022-08-24 DIAGNOSIS — J45909 Unspecified asthma, uncomplicated: Secondary | ICD-10-CM | POA: Insufficient documentation

## 2022-08-24 DIAGNOSIS — R1031 Right lower quadrant pain: Secondary | ICD-10-CM | POA: Insufficient documentation

## 2022-08-24 DIAGNOSIS — Z79899 Other long term (current) drug therapy: Secondary | ICD-10-CM | POA: Insufficient documentation

## 2022-08-24 LAB — CBC
HCT: 44.1 % (ref 36.0–46.0)
Hemoglobin: 14.7 g/dL (ref 12.0–15.0)
MCH: 30.6 pg (ref 26.0–34.0)
MCHC: 33.3 g/dL (ref 30.0–36.0)
MCV: 91.9 fL (ref 80.0–100.0)
Platelets: 243 10*3/uL (ref 150–400)
RBC: 4.8 MIL/uL (ref 3.87–5.11)
RDW: 12.8 % (ref 11.5–15.5)
WBC: 11.1 10*3/uL — ABNORMAL HIGH (ref 4.0–10.5)
nRBC: 0 % (ref 0.0–0.2)

## 2022-08-24 LAB — COMPREHENSIVE METABOLIC PANEL
ALT: 18 U/L (ref 0–44)
AST: 14 U/L — ABNORMAL LOW (ref 15–41)
Albumin: 4 g/dL (ref 3.5–5.0)
Alkaline Phosphatase: 80 U/L (ref 38–126)
Anion gap: 7 (ref 5–15)
BUN: 12 mg/dL (ref 6–20)
CO2: 23 mmol/L (ref 22–32)
Calcium: 10.5 mg/dL — ABNORMAL HIGH (ref 8.9–10.3)
Chloride: 107 mmol/L (ref 98–111)
Creatinine, Ser: 0.92 mg/dL (ref 0.44–1.00)
GFR, Estimated: >60 mL/min (ref >60)
Glucose, Bld: 85 mg/dL (ref 70–99)
Potassium: 4.1 mmol/L (ref 3.5–5.1)
Sodium: 137 mmol/L (ref 135–145)
Total Bilirubin: 0.5 mg/dL (ref 0.3–1.2)
Total Protein: 7.5 g/dL (ref 6.5–8.1)

## 2022-08-24 LAB — URINALYSIS, ROUTINE W REFLEX MICROSCOPIC
Bacteria, UA: NONE SEEN
Bilirubin Urine: NEGATIVE
Glucose, UA: NEGATIVE mg/dL
Ketones, ur: NEGATIVE mg/dL
Nitrite: NEGATIVE
Protein, ur: NEGATIVE mg/dL
Specific Gravity, Urine: 1.021 (ref 1.005–1.030)
pH: 5 (ref 5.0–8.0)

## 2022-08-24 LAB — PREGNANCY, URINE: Preg Test, Ur: NEGATIVE

## 2022-08-24 MED ORDER — OXYCODONE-ACETAMINOPHEN 5-325 MG PO TABS: 1.0000 | ORAL_TABLET | Freq: Four times a day (QID) | ORAL | 0 refills | Status: DC | PRN

## 2022-08-24 MED ORDER — MORPHINE SULFATE (PF) 4 MG/ML IV SOLN: 4.0000 mg | Freq: Once | INTRAVENOUS | Status: DC

## 2022-08-24 MED ORDER — ONDANSETRON HCL 4 MG/2ML IJ SOLN
4.0000 mg | Freq: Once | INTRAMUSCULAR | Status: AC
  Administered 2022-08-24: 4 mg via INTRAVENOUS
  Filled 2022-08-24: qty 2

## 2022-08-24 MED ORDER — IOHEXOL 300 MG/ML  SOLN
100.0000 mL | Freq: Once | INTRAMUSCULAR | Status: AC | PRN
  Administered 2022-08-24: 100 mL via INTRAVENOUS

## 2022-08-24 MED ORDER — FENTANYL CITRATE PF 50 MCG/ML IJ SOSY
50.0000 ug | PREFILLED_SYRINGE | Freq: Once | INTRAMUSCULAR | Status: AC
  Administered 2022-08-24: 50 ug via INTRAVENOUS
  Filled 2022-08-24: qty 1

## 2022-08-24 MED ORDER — ONDANSETRON HCL 4 MG PO TABS: 4.0000 mg | ORAL_TABLET | Freq: Four times a day (QID) | ORAL | 0 refills | Status: DC

## 2022-08-24 NOTE — ED Provider Notes (Signed)
[facility] Provider Note   CSN: [id number] Arrival date & time: 08/24/22  1247     History  Chief Complaint  Patient presents with   Back Pain   Abdominal Pain    Wendy Singh is a 47 y.o. female with a history including kidney stones, hypertension, asthma, UTIs, surgical history of bilateral salpingectomy who is currently awaiting a full hysterectomy secondary to menometrorrhagia under the care of Dr. Elonda Husky presenting for evaluation of bilateral lower back pain which has now shifted and is localizing in the right lower abdomen and suprapubic region.  She describes a full heavy pressure like sensation.  Pain is worsened with movement, better but does not resolve at rest.  She has had no vaginal discharge and is not currently menstruating.  She does note her urine has been dark and cloudy, she denies fevers or chills, denies dysuria and states her pain does not remind her of prior kidney stone episodes.  She has had no treatment for nor has she found any alleviators for her symptoms.  The history is provided by the patient.       Home Medications Prior to Admission medications   Medication Sig Start Date End Date Taking? Authorizing Provider  acetaminophen (TYLENOL) 500 MG tablet Take 1,000 mg by mouth every 6 (six) hours as needed for moderate pain.   Yes [provider]  ibuprofen (ADVIL) 200 MG tablet Take 800 mg by mouth every 6 (six) hours as needed for mild pain or moderate pain.   Yes [provider]  megestrol (MEGACE) 40 MG tablet 3 tablets a day for 5 days, 2 tablets a day for 5 days then 1 tablet daily 08/04/22  Yes Florian Buff, MD  ondansetron (ZOFRAN) 4 MG tablet Take 1 tablet (4 mg total) by mouth every 6 (six) hours. 08/24/22  Yes Saima Monterroso, Almyra Free, PA-C  oxyCODONE-acetaminophen (PERCOCET/ROXICET) 5-325 MG tablet Take 1 tablet by mouth every 6 (six) hours as needed for severe pain. 08/24/22  Yes Alwilda Gilland, Almyra Free,  PA-C  PROVENTIL HFA 108 (90 Base) MCG/ACT inhaler INHALE 2 PUFFS BY MOUTH EVERY 6 HOURS AS NEEDED FOR WHEEZING OR SHORTNESS OF BREATH Patient taking differently: Inhale 2 puffs into the lungs every 6 (six) hours as needed. 10/17/19  Yes Soyla Dryer, PA-C  Zinc 50 MG TABS Take 1 tablet by mouth every other day.   Yes [provider]  ascorbic acid (VITAMIN C) 500 MG tablet Take 500 mg by mouth daily. Patient not taking: Reported on 08/24/2022    [provider]  pantoprazole (PROTONIX) 20 MG tablet Take 1 tablet (20 mg total) by mouth daily. Patient not taking: Reported on 08/24/2022 01/29/21   Sherrill Raring, PA-C  traMADol (ULTRAM) 50 MG tablet Take 50 mg by mouth every 6 (six) hours as needed for moderate pain. Patient not taking: Reported on 08/04/2022    [provider]      Allergies    Penicillins    Review of Systems   Review of Systems  Constitutional:  Negative for chills and fever.  HENT:  Negative for congestion.   Eyes: Negative.   Respiratory:  Negative for chest tightness and shortness of breath.   Cardiovascular:  Negative for chest pain.  Gastrointestinal:  Positive for abdominal pain, nausea and vomiting.  Genitourinary: Negative.   Musculoskeletal:  Negative for arthralgias, joint swelling and neck pain.  Skin: Negative.  Negative for rash and wound.  Neurological:  Negative  for dizziness, weakness, light-headedness, numbness and headaches.  Psychiatric/Behavioral: Negative.    All other systems reviewed and are negative.   Physical Exam Updated Vital Signs BP (!) 157/82 (BP Location: Right Arm)   Pulse 82   Temp 98.2 F (36.8 C) (Oral)   Resp 16   LMP 06/25/2022   SpO2 99%  Physical Exam Vitals and nursing note reviewed.  Constitutional:      Appearance: She is well-developed.  HENT:     Head: Normocephalic and atraumatic.  Eyes:     Conjunctiva/sclera: Conjunctivae normal.  Cardiovascular:     Rate and Rhythm: Normal rate and  regular rhythm.     Heart sounds: Normal heart sounds.  Pulmonary:     Effort: Pulmonary effort is normal.     Breath sounds: Normal breath sounds. No wheezing.  Abdominal:     General: Bowel sounds are normal.     Palpations: Abdomen is soft.     Tenderness: There is abdominal tenderness in the right lower quadrant and suprapubic area. There is no guarding or rebound.     Hernia: No hernia is present.  Musculoskeletal:        General: Normal range of motion.     Cervical back: Normal range of motion.  Skin:    General: Skin is warm and dry.  Neurological:     Mental Status: She is alert.     ED Results / Procedures / Treatments   Labs (all labs ordered are listed, but only abnormal results are displayed) Labs Reviewed  COMPREHENSIVE METABOLIC PANEL - Abnormal; Notable for the following components:      Result Value   Calcium 10.5 (*)    AST 14 (*)    All other components within normal limits  CBC - Abnormal; Notable for the following components:   WBC 11.1 (*)    All other components within normal limits  URINALYSIS, ROUTINE W REFLEX MICROSCOPIC - Abnormal; Notable for the following components:   APPearance HAZY (*)    Hgb urine dipstick MODERATE (*)    Leukocytes,Ua LARGE (*)    All other components within normal limits  URINE CULTURE  PREGNANCY, URINE    EKG None  Radiology CT ABDOMEN PELVIS W CONTRAST  Result Date: 08/24/2022 CLINICAL DATA:  RIGHT lower quadrant pain and back pain. Additional suprapubic pain which started today EXAM: CT ABDOMEN AND PELVIS WITH CONTRAST TECHNIQUE: Multidetector CT imaging of the abdomen and pelvis was performed using the standard protocol following bolus administration of intravenous contrast. RADIATION DOSE REDUCTION: This exam was performed according to the departmental dose-optimization program which includes automated exposure control, adjustment of the mA and/or kV according to patient size and/or use of iterative reconstruction  technique. CONTRAST:  172m OMNIPAQUE IOHEXOL 300 MG/ML  SOLN COMPARISON:  CT 01/02/2022 FINDINGS: Lower chest: Lung bases are clear. Hepatobiliary: Tiny 5 mm hypodense lesion in the central liver appears benign. Normal gallbladder and common bile duct. Pancreas: Pancreas is normal. No ductal dilatation. No pancreatic inflammation. 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 Kidneys, ureters and bladder normal. No obstructive uropathy. No bladder calculi. The ureters and bladder normal. Stomach/Bowel: Stomach, small bowel, appendix, and cecum are normal. The colon and rectosigmoid colon are normal. Vascular/Lymphatic: Abdominal aorta is normal caliber. There is no retroperitoneal or periportal lymphadenopathy. No pelvic lymphadenopathy. Reproductive: Several enhancing lesions in the uterine body. Exophytic enhancing lesion from the uterine fundus measuring 3.3  cm. Findings are unchanged from comparison exam and most consistent with multiple benign leiomyomas. Ovaries are normal. Other: No inguinal hernia or ventral hernia. Musculoskeletal: No aggressive osseous lesion. IMPRESSION: 1. No acute findings in the abdomen pelvis. 2. Normal appendix. 3. No nephrolithiasis or ureterolithiasis. 4. Several intramural and exophytic uterine leiomyomas. Electronically Signed   By: Suzy Bouchard M.D.   On: 08/24/2022 15:46    Procedures Procedures    Medications Ordered in ED Medications  ondansetron (ZOFRAN) injection 4 mg (4 mg Intravenous Given 08/24/22 1431)  fentaNYL (SUBLIMAZE) injection 50 mcg (50 mcg Intravenous Given 08/24/22 1431)  iohexol (OMNIPAQUE) 300 MG/ML solution 100 mL (100 mLs Intravenous Contrast Given 08/24/22 1531)    ED Course/ Medical Decision Making/ A&P                             Medical Decision Making Patient with low back pain which has migrated to the right lower quadrant in  association with nausea and vomiting, no associated fevers, no dysuria differential including kidney or ureteral stones, pyelonephritis, acute cystitis, acute appendicitis, other abdominal obstruction or infection, this could also represent a GYN source such as ovarian cyst or torsion, she does have [place] menorrhagia with history of fibroids and is currently awaiting hysterectomy.  Imaging and labs today are nondiagnostic.  She was given an IV dose of fentanyl which gave her pain improvement but was brief.  Her CT shows no acute findings, she does have fibroids which was previously known.  Ultrasound was ordered to rule out ovarian torsion, however patient became very tearful and frustrated since we did not have any answers for her based on the resulted labs and her resulted CT scan.  She was complaining of severe pain and was not comfortable, she was offered additional pain medicine which she refused.  She also was not interested in obtaining the ultrasound today.  She states she will follow-up with Dr. Elonda Husky tomorrow.  She does have a fair amount of hemoglobin in her urine, she has no symptoms to suggest UTI, culture has been ordered.  It is possible she has passed a small stone and continues to have ureteral colic which could explain her pain.  She was offered a dose of Toradol, she refused stating she has this medication at home and it does not help, however upon further review of her chart it appears that the medicine she has at home is tramadol, not Toradol.    Amount and/or Complexity of Data Reviewed Labs: ordered. Radiology: ordered.  Risk Prescription drug management.           Final Clinical Impression(s) / ED Diagnoses Final diagnoses:  Right lower quadrant abdominal pain    Rx / DC Orders ED Discharge Orders          Ordered    ondansetron (ZOFRAN) 4 MG tablet  Every 6 hours        08/24/22 1707    oxyCODONE-acetaminophen (PERCOCET/ROXICET) 5-325 MG tablet  Every 6 hours  PRN        08/24/22 1707              Evalee Jefferson, PA-C 08/24/22 1722    Isla Pence, MD 08/25/22 [phone]

## 2022-08-24 NOTE — Discharge Instructions (Addendum)
As discussed the lab test today and the CT scan were negative for acute findings, although you do have a moderate amount of hemoglobin in your urine but no sign of a urinary infection.  Your CT scan was negative for kidney stones, appendicitis or any other source of pain in this right lower abdomen.  Given the blood in your urine it is possible you have passed a kidney stone which is 1 reason that may not have shown up on your CT scan.  Given your recent GYN problems, an ultrasound had been ordered but I understand you are jnot interested in obtaining this today.  I do recommend close follow-up with Dr. Elonda Husky.    An IV medicine called Toradol can immediately eliminate pain if this is renal colic, but I understand you were not interested in any further medications today.  Please return here for further evaluation if your symptoms worsen or not improving with today's treatment plan.  Do not drive within first of taking oxycodone.  This medicine will make you drowsy.

## 2022-08-24 NOTE — ED Triage Notes (Signed)
Pt c/o lower back pain started yesterday with pain radiating into suprapubic area today. States hx of kidney stones but this feels different. Denies vag d/c or bleeding. C/o urine being dark and cloudy. Pt tearful in triage. N/v this am. Mm moist.

## 2022-08-25 ENCOUNTER — Telehealth: Payer: Self-pay

## 2022-08-25 LAB — URINE CULTURE: Culture: <10000 — AB

## 2022-08-25 NOTE — Telephone Encounter (Signed)
PATIENT WANTS TO SPEAK TO A NURSE ABOUT HER ER VISIT. PATIENT STATED THAT SHE HAS SURGERY SCHEDULED FOR APRIL 10.

## 2022-08-25 NOTE — Telephone Encounter (Signed)
Spoke with patient she was having pain on Sunday and yesterday. She went to the ER and had a ct scan. She is still having pain and pressure. She states that she refused an ultrasound at the ER. ER MD told her to let Dr. Elonda Husky know that she had went to ED and get him to look at the ct scan to see if he needed her to do anything else at this time. Advised patient I will send message to Dr. Elonda Husky and let her know what he advises.

## 2022-09-17 ENCOUNTER — Telehealth: Payer: Self-pay

## 2022-09-17 NOTE — Telephone Encounter (Signed)
Patient called back wanting to speak to Dr. Elonda Husky.  She is on break from 58- 1 he can call her at her number.  But if he calls after 1 call her work number.

## 2022-09-17 NOTE — Telephone Encounter (Signed)
Patient called and stated that she was returning Dr. Brynda Greathouse call. Patient would like for him to call her at work. [phone].

## 2022-09-18 NOTE — Patient Instructions (Signed)
Wendy Singh  09/18/2022     @PREFPERIOPPHARMACY @   Your procedure is scheduled on  09/22/2022.   Report to [facility] at  0815  A.M.   Call this number if you have problems the morning of surgery:  [phone]  If you experience any cold or flu symptoms such as cough, fever, chills, shortness of breath, etc. between now and your scheduled surgery, please notify us at the above number.   Remember:  Do not eat after midnight.   You may drink clear liquids until 0615 am on 09/22/2022.    Clear liquids allowed are:                    Water, Juice (No red color; non-citric and without pulp; diabetics please choose diet or no sugar options), Carbonated beverages (diabetics please choose diet or no sugar options), Clear Tea (No creamer, milk, or cream, including half & half and powdered creamer), Black Coffee Only (No creamer, milk or cream, including half & half and powdered creamer), Plain Jell-O Only (No red color; diabetics please choose no sugar options), Clear Sports drink (No red color; diabetics please choose diet or no sugar options), and Plain Popsicles Only (No red color; diabetics please choose no sugar options)       At 0615 am on 09/22/2022 drink your carb drink. You can have nothing else to drink after this.    Take these medicines the morning of surgery with A SIP OF WATER           zofran (if needed), oxycodone(if needed).     Do not wear jewelry, make-up or nail polish.  Do not wear lotions, powders, or perfumes, or deodorant.  Do not shave 48 hours prior to surgery.  Men may shave face and neck.  Do not bring valuables to the hospital.  [facility] is not responsible for any belongings or valuables.  Contacts, dentures or bridgework may not be worn into surgery.  Leave your suitcase in the car.  After surgery it may be brought to your room.  For patients admitted to the hospital, discharge time will be determined by your treatment team.  Patients  discharged the day of surgery will not be allowed to drive home and must have someone with them for 24 hours.    Special instructions:   DO NOT smoke tobacco or vape for 24 hours before your procedure.  Please read over the following fact sheets that you were given. Pain Booklet, Coughing and Deep Breathing, Blood Transfusion Information, Surgical Site Infection Prevention, Anesthesia Post-op Instructions, and Care and Recovery After Surgery      Total Laparoscopic Hysterectomy, Care After The following information offers guidance on how to care for yourself after your procedure. Your health care provider may also give you more specific instructions. If you have problems or questions, contact your health care provider. What can I expect after the procedure? After the procedure, it is common to have: Pain, bruising, and numbness around your incisions. Tiredness (fatigue). Poor appetite. Less interest in sex. Vaginal discharge or bleeding. You will need to use a sanitary pad after this procedure. Feelings of sadness or other emotions. If your ovaries were also removed, it is also common to have symptoms of menopause, such as hot flashes, night sweats, and lack of sleep (insomnia). Follow these instructions at home: Medicines Take over-the-counter and prescription medicines only as told by your health care provider. Ask  your health care provider if the medicine prescribed to you: Requires you to avoid driving or using machinery. Can cause constipation. You may need to take these actions to prevent or treat constipation: Drink enough fluid to keep your urine pale yellow. Take over-the-counter or prescription medicines. Eat foods that are high in fiber, such as beans, whole grains, and fresh fruits and vegetables. Limit foods that are high in fat and processed sugars, such as fried or sweet foods. Incision care  Follow instructions from your health care provider about how to take care of  your incisions. Make sure you: Wash your hands with soap and water for at least 20 seconds before and after you change your bandage (dressing). If soap and water are not available, use hand sanitizer. Change your dressing as told by your health care provider. Leave stitches (sutures), skin glue, or adhesive strips in place. These skin closures may need to stay in place for 2 weeks or longer. If adhesive strip edges start to loosen and curl up, you may trim the loose edges. Do not remove adhesive strips completely unless your health care provider tells you to do that. Check your incision areas every day for signs of infection. Check for: More redness, swelling, or pain. Fluid or blood. Warmth. Pus or a bad smell. Activity  Rest as told by your health care provider. Avoid sitting for a long time without moving. Get up to take short walks every 1-2 hours. This is important to improve blood flow and breathing. Ask for help if you feel weak or unsteady. Return to your normal activities as told by your health care provider. Ask your health care provider what activities are safe for you. Do not lift anything that is heavier than 10 lb (4.5 kg), or the limit that you are told, for one month after surgery or until your health care provider says that it is safe. If you were given a sedative during the procedure, it can affect you for several hours. Do not drive or operate machinery until your health care provider says that it is safe. Lifestyle Do not use any products that contain nicotine or tobacco. These products include cigarettes, chewing tobacco, and vaping devices, such as e-cigarettes. These can delay healing after surgery. If you need help quitting, ask your health care provider. Do not drink alcohol until your health care provider approves. General instructions  Do not douche, use tampons, or have sex for at least 6 weeks, or as told by your health care provider. If you struggle with physical  or emotional changes after your procedure, speak with your health care provider or a therapist. Do not take baths, swim, or use a hot tub until your health care provider approves. You may only be allowed to take showers for 2-3 weeks. Keep your dressing dry until your health care provider says it can be removed. Try to have someone at home with you for the first 1-2 weeks to help with your daily chores. Wear compression stockings as told by your health care provider. These stockings help to prevent blood clots and reduce swelling in your legs. Keep all follow-up visits. This is important. Contact a health care provider if: You have any of these signs of infection: Chills or a fever. More redness, swelling, or pain around an incision. Fluid or blood coming from an incision. Warmth coming from an incision. Pus or a bad smell coming from an incision. An incision opens. You feel dizzy or light-headed. You have  pain or bleeding when you urinate, or you are unable to urinate. You have abnormal vaginal discharge. You have pain that does not get better with medicine. Get help right away if: You have a fever and your symptoms suddenly get worse. You have severe abdominal pain. You have chest pain or shortness of breath. You faint. You have pain, swelling, or redness in your leg. You have heavy vaginal bleeding with blood clots, soaking through a sanitary pad in less than 1 hour. These symptoms may represent a serious problem that is an emergency. Do not wait to see if the symptoms will go away. Get medical help right away. Call your local emergency services (911 in the [place] Do not drive yourself to the hospital. Summary After the procedure, it is common to have pain and bruising around your incisions. Do not take baths, swim, or use a hot tub until your health care provider approves. Do not lift anything that is heavier than 10 lb (4.5 kg), or the limit that you are told, for one month after  surgery or until your health care provider says that it is safe. Tell your health care provider if you have any signs or symptoms of infection after the procedure. Get help right away if you have severe abdominal pain, chest pain, shortness of breath, or heavy bleeding from your vagina. This information is not intended to replace advice given to you by your health care provider. Make sure you discuss any questions you have with your health care provider. Document Revised: 02/02/2020 Document Reviewed: 02/02/2020 Elsevier Patient Education  2023 [employer] General Anesthesia, Adult, Care After The following information offers guidance on how to care for yourself after your procedure. Your health care provider may also give you more specific instructions. If you have problems or questions, contact your health care provider. What can I expect after the procedure? After the procedure, it is common for people to: Have pain or discomfort at the IV site. Have nausea or vomiting. Have a sore throat or hoarseness. Have trouble concentrating. Feel cold or chills. Feel weak, sleepy, or tired (fatigue). Have soreness and body aches. These can affect parts of the body that were not involved in surgery. Follow these instructions at home: For the time period you were told by your health care provider:  Rest. Do not participate in activities where you could fall or become injured. Do not drive or use machinery. Do not drink alcohol. Do not take sleeping pills or medicines that cause drowsiness. Do not make important decisions or sign legal documents. Do not take care of children on your own. General instructions Drink enough fluid to keep your urine pale yellow. If you have sleep apnea, surgery and certain medicines can increase your risk for breathing problems. Follow instructions from your health care provider about wearing your sleep device: Anytime you are sleeping, including during daytime  naps. While taking prescription pain medicines, sleeping medicines, or medicines that make you drowsy. Return to your normal activities as told by your health care provider. Ask your health care provider what activities are safe for you. Take over-the-counter and prescription medicines only as told by your health care provider. Do not use any products that contain nicotine or tobacco. These products include cigarettes, chewing tobacco, and vaping devices, such as e-cigarettes. These can delay incision healing after surgery. If you need help quitting, ask your health care provider. Contact a health care provider if: You have nausea or vomiting that does not get  better with medicine. You vomit every time you eat or drink. You have pain that does not get better with medicine. You cannot urinate or have bloody urine. You develop a skin rash. You have a fever. Get help right away if: You have trouble breathing. You have chest pain. You vomit blood. These symptoms may be an emergency. Get help right away. Call 911. Do not wait to see if the symptoms will go away. Do not drive yourself to the hospital. Summary After the procedure, it is common to have a sore throat, hoarseness, nausea, vomiting, or to feel weak, sleepy, or fatigue. For the time period you were told by your health care provider, do not drive or use machinery. Get help right away if you have difficulty breathing, have chest pain, or vomit blood. These symptoms may be an emergency. This information is not intended to replace advice given to you by your health care provider. Make sure you discuss any questions you have with your health care provider. Document Revised: 08/29/2021 Document Reviewed: 08/29/2021 Elsevier Patient Education  2023 [employer] How to Use Chlorhexidine Before Surgery Chlorhexidine gluconate (CHG) is a germ-killing (antiseptic) solution that is used to clean the skin. It can get rid of the bacteria that  normally live on the skin and can keep them away for about 24 hours. To clean your skin with CHG, you may be given: A CHG solution to use in the shower or as part of a sponge bath. A prepackaged cloth that contains CHG. Cleaning your skin with CHG may help lower the risk for infection: While you are staying in the intensive care unit of the hospital. If you have a vascular access, such as a central line, to provide short-term or long-term access to your veins. If you have a catheter to drain urine from your bladder. If you are on a ventilator. A ventilator is a machine that helps you breathe by moving air in and out of your lungs. After surgery. What are the risks? Risks of using CHG include: A skin reaction. Hearing loss, if CHG gets in your ears and you have a perforated eardrum. Eye injury, if CHG gets in your eyes and is not rinsed out. The CHG product catching fire. Make sure that you avoid smoking and flames after applying CHG to your skin. Do not use CHG: If you have a chlorhexidine allergy or have previously reacted to chlorhexidine. On babies younger than 53 months of age. How to use CHG solution Use CHG only as told by your health care provider, and follow the instructions on the label. Use the full amount of CHG as directed. Usually, this is one bottle. During a shower Follow these steps when using CHG solution during a shower (unless your health care provider gives you different instructions): Start the shower. Use your normal soap and shampoo to wash your face and hair. Turn off the shower or move out of the shower stream. Pour the CHG onto a clean washcloth. Do not use any type of brush or rough-edged sponge. Starting at your neck, lather your body down to your toes. Make sure you follow these instructions: If you will be having surgery, pay special attention to the part of your body where you will be having surgery. Scrub this area for at least 1 minute. Do not use CHG on  your head or face. If the solution gets into your ears or eyes, rinse them well with water. Avoid your genital area. Avoid any  areas of skin that have broken skin, cuts, or scrapes. Scrub your back and under your arms. Make sure to wash skin folds. Let the lather sit on your skin for 1-2 minutes or as long as told by your health care provider. Thoroughly rinse your entire body in the shower. Make sure that all body creases and crevices are rinsed well. Dry off with a clean towel. Do not put any substances on your body afterward--such as powder, lotion, or perfume--unless you are told to do so by your health care provider. Only use lotions that are recommended by the manufacturer. Put on clean clothes or pajamas. If it is the night before your surgery, sleep in clean sheets.  During a sponge bath Follow these steps when using CHG solution during a sponge bath (unless your health care provider gives you different instructions): Use your normal soap and shampoo to wash your face and hair. Pour the CHG onto a clean washcloth. Starting at your neck, lather your body down to your toes. Make sure you follow these instructions: If you will be having surgery, pay special attention to the part of your body where you will be having surgery. Scrub this area for at least 1 minute. Do not use CHG on your head or face. If the solution gets into your ears or eyes, rinse them well with water. Avoid your genital area. Avoid any areas of skin that have broken skin, cuts, or scrapes. Scrub your back and under your arms. Make sure to wash skin folds. Let the lather sit on your skin for 1-2 minutes or as long as told by your health care provider. Using a different clean, wet washcloth, thoroughly rinse your entire body. Make sure that all body creases and crevices are rinsed well. Dry off with a clean towel. Do not put any substances on your body afterward--such as powder, lotion, or perfume--unless you are told to  do so by your health care provider. Only use lotions that are recommended by the manufacturer. Put on clean clothes or pajamas. If it is the night before your surgery, sleep in clean sheets. How to use CHG prepackaged cloths Only use CHG cloths as told by your health care provider, and follow the instructions on the label. Use the CHG cloth on clean, dry skin. Do not use the CHG cloth on your head or face unless your health care provider tells you to. When washing with the CHG cloth: Avoid your genital area. Avoid any areas of skin that have broken skin, cuts, or scrapes. Before surgery Follow these steps when using a CHG cloth to clean before surgery (unless your health care provider gives you different instructions): Using the CHG cloth, vigorously scrub the part of your body where you will be having surgery. Scrub using a back-and-forth motion for 3 minutes. The area on your body should be completely wet with CHG when you are done scrubbing. Do not rinse. Discard the cloth and let the area air-dry. Do not put any substances on the area afterward, such as powder, lotion, or perfume. Put on clean clothes or pajamas. If it is the night before your surgery, sleep in clean sheets.  For general bathing Follow these steps when using CHG cloths for general bathing (unless your health care provider gives you different instructions). Use a separate CHG cloth for each area of your body. Make sure you wash between any folds of skin and between your fingers and toes. Wash your body in the following order,  switching to a new cloth after each step: The front of your neck, shoulders, and chest. Both of your arms, under your arms, and your hands. Your stomach and groin area, avoiding the genitals. Your right leg and foot. Your left leg and foot. The back of your neck, your back, and your buttocks. Do not rinse. Discard the cloth and let the area air-dry. Do not put any substances on your body  afterward--such as powder, lotion, or perfume--unless you are told to do so by your health care provider. Only use lotions that are recommended by the manufacturer. Put on clean clothes or pajamas. Contact a health care provider if: Your skin gets irritated after scrubbing. You have questions about using your solution or cloth. You swallow any chlorhexidine. Call your local poison control center [phone] in the [place] Get help right away if: Your eyes itch badly, or they become very red or swollen. Your skin itches badly and is red or swollen. Your hearing changes. You have trouble seeing. You have swelling or tingling in your mouth or throat. You have trouble breathing. These symptoms may represent a serious problem that is an emergency. Do not wait to see if the symptoms will go away. Get medical help right away. Call your local emergency services (911 in the [place] Do not drive yourself to the hospital. Summary Chlorhexidine gluconate (CHG) is a germ-killing (antiseptic) solution that is used to clean the skin. Cleaning your skin with CHG may help to lower your risk for infection. You may be given CHG to use for bathing. It may be in a bottle or in a prepackaged cloth to use on your skin. Carefully follow your health care provider's instructions and the instructions on the product label. Do not use CHG if you have a chlorhexidine allergy. Contact your health care provider if your skin gets irritated after scrubbing. This information is not intended to replace advice given to you by your health care provider. Make sure you discuss any questions you have with your health care provider. Document Revised: 09/29/2021 Document Reviewed: 08/12/2020 Elsevier Patient Education  2023 [employer].

## 2022-09-18 NOTE — Telephone Encounter (Signed)
done

## 2022-09-20 ENCOUNTER — Other Ambulatory Visit: Payer: Self-pay | Admitting: Obstetrics & Gynecology

## 2022-09-20 DIAGNOSIS — Z01818 Encounter for other preprocedural examination: Secondary | ICD-10-CM

## 2022-09-20 MED ORDER — BUPIVACAINE LIPOSOME 1.3 % IJ SUSP: 20.0000 mL | Freq: Once | INTRAMUSCULAR | Status: AC

## 2022-09-20 MED ORDER — KETOROLAC TROMETHAMINE 30 MG/ML IJ SOLN: 30.0000 mg | Freq: Once | INTRAMUSCULAR | Status: AC

## 2022-09-21 ENCOUNTER — Encounter (HOSPITAL_COMMUNITY)
Admission: RE | Admit: 2022-09-21 | Discharge: 2022-09-21 | Disposition: A | Discharge status: Home / Self Care | Payer: Self-pay | Source: Ambulatory Visit | Attending: Obstetrics & Gynecology | Admitting: Obstetrics & Gynecology

## 2022-09-21 ENCOUNTER — Encounter (HOSPITAL_COMMUNITY): Payer: Self-pay

## 2022-09-21 VITALS — BP 132/85 | HR 80 | Temp 97.8°F | Resp 18 | Ht 71.0 in | Wt 242.9 lb

## 2022-09-21 DIAGNOSIS — Z01818 Encounter for other preprocedural examination: Secondary | ICD-10-CM

## 2022-09-21 DIAGNOSIS — Z01812 Encounter for preprocedural laboratory examination: Secondary | ICD-10-CM | POA: Insufficient documentation

## 2022-09-21 HISTORY — DX: Other complications of anesthesia, initial encounter: T88.59XA

## 2022-09-21 HISTORY — DX: Personal history of urinary calculi: Z87.442

## 2022-09-21 HISTORY — DX: Anxiety disorder, unspecified: F41.9

## 2022-09-21 HISTORY — DX: Gastro-esophageal reflux disease without esophagitis: K21.9

## 2022-09-21 HISTORY — DX: Other specified postprocedural states: Z98.890

## 2022-09-21 HISTORY — DX: Other specified postprocedural states: R11.2

## 2022-09-21 LAB — URINALYSIS, ROUTINE W REFLEX MICROSCOPIC
Bacteria, UA: NONE SEEN
Bilirubin Urine: NEGATIVE
Glucose, UA: NEGATIVE mg/dL
Ketones, ur: NEGATIVE mg/dL
Nitrite: NEGATIVE
Protein, ur: NEGATIVE mg/dL
Specific Gravity, Urine: 1.027 (ref 1.005–1.030)
pH: 5 (ref 5.0–8.0)

## 2022-09-21 LAB — TYPE AND SCREEN
ABO/RH(D): A POS
Antibody Screen: NEGATIVE

## 2022-09-21 LAB — CBC
HCT: 41.4 % (ref 36.0–46.0)
Hemoglobin: 14.1 g/dL (ref 12.0–15.0)
MCH: 30.8 pg (ref 26.0–34.0)
MCHC: 34.1 g/dL (ref 30.0–36.0)
MCV: 90.4 fL (ref 80.0–100.0)
Platelets: 222 10*3/uL (ref 150–400)
RBC: 4.58 MIL/uL (ref 3.87–5.11)
RDW: 13.2 % (ref 11.5–15.5)
WBC: 7.2 10*3/uL (ref 4.0–10.5)
nRBC: 0 % (ref 0.0–0.2)

## 2022-09-21 LAB — COMPREHENSIVE METABOLIC PANEL
ALT: 20 U/L (ref 0–44)
AST: 24 U/L (ref 15–41)
Albumin: 4 g/dL (ref 3.5–5.0)
Alkaline Phosphatase: 72 U/L (ref 38–126)
Anion gap: 9 (ref 5–15)
BUN: 15 mg/dL (ref 6–20)
CO2: 18 mmol/L — ABNORMAL LOW (ref 22–32)
Calcium: 10.4 mg/dL — ABNORMAL HIGH (ref 8.9–10.3)
Chloride: 107 mmol/L (ref 98–111)
Creatinine, Ser: 0.9 mg/dL (ref 0.44–1.00)
GFR, Estimated: >60 mL/min (ref >60)
Glucose, Bld: 109 mg/dL — ABNORMAL HIGH (ref 70–99)
Potassium: 3.7 mmol/L (ref 3.5–5.1)
Sodium: 134 mmol/L — ABNORMAL LOW (ref 135–145)
Total Bilirubin: 1 mg/dL (ref 0.3–1.2)
Total Protein: 7.5 g/dL (ref 6.5–8.1)

## 2022-09-21 LAB — RAPID HIV SCREEN (HIV 1/2 AB+AG)
HIV 1/2 Antibodies: NONREACTIVE
HIV-1 P24 Antigen - HIV24: NONREACTIVE

## 2022-09-21 LAB — POCT PREGNANCY, URINE: Preg Test, Ur: NEGATIVE

## 2022-09-22 ENCOUNTER — Encounter (HOSPITAL_COMMUNITY)
Admission: RE | Disposition: A | Discharge status: Home / Self Care | Payer: Self-pay | Source: Home / Self Care | Attending: Obstetrics & Gynecology

## 2022-09-22 ENCOUNTER — Ambulatory Visit (HOSPITAL_COMMUNITY): Payer: Self-pay | Admitting: Anesthesiology

## 2022-09-22 ENCOUNTER — Other Ambulatory Visit: Payer: Self-pay | Admitting: Obstetrics & Gynecology

## 2022-09-22 ENCOUNTER — Ambulatory Visit (HOSPITAL_BASED_OUTPATIENT_CLINIC_OR_DEPARTMENT_OTHER): Payer: Self-pay | Admitting: Anesthesiology

## 2022-09-22 ENCOUNTER — Other Ambulatory Visit: Payer: Self-pay

## 2022-09-22 ENCOUNTER — Ambulatory Visit (HOSPITAL_COMMUNITY)
Admission: RE | Admit: 2022-09-22 | Discharge: 2022-09-22 | Disposition: A | Discharge status: Home / Self Care | Payer: Self-pay | Attending: Obstetrics & Gynecology | Admitting: Obstetrics & Gynecology

## 2022-09-22 ENCOUNTER — Encounter (HOSPITAL_COMMUNITY): Payer: Self-pay | Admitting: Obstetrics & Gynecology

## 2022-09-22 DIAGNOSIS — I10 Essential (primary) hypertension: Secondary | ICD-10-CM | POA: Insufficient documentation

## 2022-09-22 DIAGNOSIS — Z8249 Family history of ischemic heart disease and other diseases of the circulatory system: Secondary | ICD-10-CM | POA: Insufficient documentation

## 2022-09-22 DIAGNOSIS — N941 Unspecified dyspareunia: Secondary | ICD-10-CM

## 2022-09-22 DIAGNOSIS — N946 Dysmenorrhea, unspecified: Secondary | ICD-10-CM

## 2022-09-22 DIAGNOSIS — F17210 Nicotine dependence, cigarettes, uncomplicated: Secondary | ICD-10-CM | POA: Insufficient documentation

## 2022-09-22 DIAGNOSIS — D251 Intramural leiomyoma of uterus: Secondary | ICD-10-CM | POA: Insufficient documentation

## 2022-09-22 DIAGNOSIS — N921 Excessive and frequent menstruation with irregular cycle: Secondary | ICD-10-CM

## 2022-09-22 DIAGNOSIS — K219 Gastro-esophageal reflux disease without esophagitis: Secondary | ICD-10-CM | POA: Insufficient documentation

## 2022-09-22 HISTORY — PX: ROBOTIC ASSISTED LAPAROSCOPIC HYSTERECTOMY AND SALPINGECTOMY: SHX6379

## 2022-09-22 SURGERY — XI ROBOTIC ASSISTED LAPAROSCOPIC HYSTERECTOMY AND SALPINGECTOMY
Anesthesia: General | Site: Abdomen

## 2022-09-22 MED ORDER — OXYCODONE-ACETAMINOPHEN 7.5-325 MG PO TABS: 1.0000 | ORAL_TABLET | Freq: Four times a day (QID) | ORAL | 0 refills | Status: DC | PRN

## 2022-09-22 MED ORDER — CIPROFLOXACIN HCL 500 MG PO TABS: 500.0000 mg | ORAL_TABLET | Freq: Two times a day (BID) | ORAL | 0 refills | Status: DC

## 2022-09-22 MED ORDER — KETOROLAC TROMETHAMINE 10 MG PO TABS: 10.0000 mg | ORAL_TABLET | Freq: Three times a day (TID) | ORAL | 0 refills | Status: DC | PRN

## 2022-09-22 MED ORDER — HYDROMORPHONE HCL 1 MG/ML IJ SOLN
0.5000 mg | INTRAMUSCULAR | Status: DC | PRN
  Administered 2022-09-22 (×2): 0.5 mg via INTRAVENOUS
  Filled 2022-09-22 (×2): qty 0.5

## 2022-09-22 MED ORDER — KETOROLAC TROMETHAMINE 30 MG/ML IJ SOLN
INTRAMUSCULAR | Status: AC
  Filled 2022-09-22: qty 1

## 2022-09-22 MED ORDER — LIDOCAINE HCL (PF) 2 % IJ SOLN
INTRAMUSCULAR | Status: AC
  Filled 2022-09-22: qty 5

## 2022-09-22 MED ORDER — ONDANSETRON 8 MG PO TBDP: 8.0000 mg | ORAL_TABLET | Freq: Three times a day (TID) | ORAL | 0 refills | Status: AC | PRN

## 2022-09-22 MED ORDER — BUPIVACAINE LIPOSOME 1.3 % IJ SUSP
INTRAMUSCULAR | Status: DC | PRN
  Administered 2022-09-22: 30 mL

## 2022-09-22 MED ORDER — SEVOFLURANE IN SOLN
RESPIRATORY_TRACT | Status: AC
  Filled 2022-09-22: qty 250

## 2022-09-22 MED ORDER — ONDANSETRON HCL 4 MG/2ML IJ SOLN
4.0000 mg | Freq: Once | INTRAMUSCULAR | Status: AC | PRN
  Administered 2022-09-22: 4 mg via INTRAVENOUS
  Filled 2022-09-22: qty 2

## 2022-09-22 MED ORDER — DEXAMETHASONE SODIUM PHOSPHATE 10 MG/ML IJ SOLN
INTRAMUSCULAR | Status: AC
  Filled 2022-09-22: qty 1

## 2022-09-22 MED ORDER — ROCURONIUM BROMIDE 100 MG/10ML IV SOLN
INTRAVENOUS | Status: DC | PRN
  Administered 2022-09-22: 10 mg via INTRAVENOUS
  Administered 2022-09-22: 100 mg via INTRAVENOUS
  Administered 2022-09-22: 20 mg via INTRAVENOUS

## 2022-09-22 MED ORDER — CHLORHEXIDINE GLUCONATE 0.12 % MT SOLN
15.0000 mL | Freq: Once | OROMUCOSAL | Status: AC
  Administered 2022-09-22: 15 mL via OROMUCOSAL

## 2022-09-22 MED ORDER — LORAZEPAM 2 MG/ML IJ SOLN: 1.0000 mg | Freq: Once | INTRAMUSCULAR | Status: DC

## 2022-09-22 MED ORDER — OXYCODONE HCL 5 MG PO TABS
5.0000 mg | ORAL_TABLET | Freq: Once | ORAL | Status: AC | PRN
  Administered 2022-09-22: 5 mg via ORAL
  Filled 2022-09-22: qty 1

## 2022-09-22 MED ORDER — OXYCODONE HCL 5 MG/5ML PO SOLN: 5.0000 mg | Freq: Once | ORAL | Status: AC | PRN

## 2022-09-22 MED ORDER — ROCURONIUM BROMIDE 10 MG/ML (PF) SYRINGE
PREFILLED_SYRINGE | INTRAVENOUS | Status: AC
  Filled 2022-09-22: qty 10

## 2022-09-22 MED ORDER — FENTANYL CITRATE (PF) 250 MCG/5ML IJ SOLN
INTRAMUSCULAR | Status: AC
  Filled 2022-09-22: qty 5

## 2022-09-22 MED ORDER — PROPOFOL 10 MG/ML IV BOLUS
INTRAVENOUS | Status: DC | PRN
  Administered 2022-09-22: 30 mg via INTRAVENOUS
  Administered 2022-09-22: 20 mg via INTRAVENOUS
  Administered 2022-09-22: 50 mg via INTRAVENOUS
  Administered 2022-09-22: 200 mg via INTRAVENOUS
  Administered 2022-09-22: 20 mg via INTRAVENOUS

## 2022-09-22 MED ORDER — MIDAZOLAM HCL 2 MG/2ML IJ SOLN
2.0000 mg | Freq: Once | INTRAMUSCULAR | Status: AC
  Administered 2022-09-22: 2 mg via INTRAVENOUS
  Filled 2022-09-22: qty 2

## 2022-09-22 MED ORDER — DEXMEDETOMIDINE HCL IN NACL 80 MCG/20ML IV SOLN
INTRAVENOUS | Status: DC | PRN
  Administered 2022-09-22: 8 ug via BUCCAL
  Administered 2022-09-22: 4 ug via BUCCAL
  Administered 2022-09-22 (×5): 8 ug via BUCCAL

## 2022-09-22 MED ORDER — DEXMEDETOMIDINE HCL IN NACL 80 MCG/20ML IV SOLN
INTRAVENOUS | Status: AC
  Filled 2022-09-22: qty 20

## 2022-09-22 MED ORDER — BUPIVACAINE LIPOSOME 1.3 % IJ SUSP
INTRAMUSCULAR | Status: AC
  Filled 2022-09-22: qty 20

## 2022-09-22 MED ORDER — FENTANYL CITRATE (PF) 100 MCG/2ML IJ SOLN
INTRAMUSCULAR | Status: DC | PRN
  Administered 2022-09-22 (×6): 50 ug via INTRAVENOUS
  Administered 2022-09-22: 100 ug via INTRAVENOUS
  Administered 2022-09-22 (×4): 50 ug via INTRAVENOUS

## 2022-09-22 MED ORDER — KETOROLAC TROMETHAMINE 30 MG/ML IJ SOLN
30.0000 mg | Freq: Once | INTRAMUSCULAR | Status: AC
  Administered 2022-09-22: 30 mg via INTRAVENOUS

## 2022-09-22 MED ORDER — CLINDAMYCIN PHOSPHATE 900 MG/50ML IV SOLN
900.0000 mg | INTRAVENOUS | Status: AC
  Administered 2022-09-22: 900 mg via INTRAVENOUS
  Filled 2022-09-22: qty 50

## 2022-09-22 MED ORDER — SUGAMMADEX SODIUM 500 MG/5ML IV SOLN
INTRAVENOUS | Status: DC | PRN
  Administered 2022-09-22: 200 mg via INTRAVENOUS

## 2022-09-22 MED ORDER — DEXAMETHASONE SODIUM PHOSPHATE 10 MG/ML IJ SOLN
INTRAMUSCULAR | Status: DC | PRN
  Administered 2022-09-22: 8 mg via INTRAVENOUS

## 2022-09-22 MED ORDER — LORAZEPAM 2 MG/ML IJ SOLN
1.0000 mg | Freq: Once | INTRAMUSCULAR | Status: AC
  Administered 2022-09-22: 1 mg via INTRAVENOUS

## 2022-09-22 MED ORDER — STERILE WATER FOR IRRIGATION IR SOLN
Status: DC | PRN
  Administered 2022-09-22: 500 mL

## 2022-09-22 MED ORDER — LACTATED RINGERS IV SOLN
INTRAVENOUS | Status: DC
  Administered 2022-09-22: 500 mL via INTRAVENOUS

## 2022-09-22 MED ORDER — FENTANYL CITRATE PF 50 MCG/ML IJ SOSY: 25.0000 ug | PREFILLED_SYRINGE | INTRAMUSCULAR | Status: DC | PRN

## 2022-09-22 MED ORDER — LORAZEPAM 2 MG/ML IJ SOLN
INTRAMUSCULAR | Status: AC
  Filled 2022-09-22: qty 1

## 2022-09-22 MED ORDER — ORAL CARE MOUTH RINSE: 15.0000 mL | Freq: Once | OROMUCOSAL | Status: AC

## 2022-09-22 MED ORDER — POVIDONE-IODINE 10 % EX SWAB
2.0000 | Freq: Once | CUTANEOUS | Status: AC
  Administered 2022-09-22: 2 via TOPICAL

## 2022-09-22 MED ORDER — GENTAMICIN SULFATE 40 MG/ML IJ SOLN
5.0000 mg/kg | INTRAVENOUS | Status: AC
  Administered 2022-09-22: 550 mg via INTRAVENOUS
  Filled 2022-09-22: qty 13.75

## 2022-09-22 MED ORDER — FENTANYL CITRATE (PF) 100 MCG/2ML IJ SOLN
INTRAMUSCULAR | Status: AC
  Filled 2022-09-22: qty 2

## 2022-09-22 MED ORDER — ONDANSETRON HCL 4 MG/2ML IJ SOLN
INTRAMUSCULAR | Status: DC | PRN
  Administered 2022-09-22: 4 mg via INTRAVENOUS

## 2022-09-22 MED ORDER — SCOPOLAMINE 1 MG/3DAYS TD PT72
1.0000 | MEDICATED_PATCH | Freq: Once | TRANSDERMAL | Status: DC
  Administered 2022-09-22: 1.5 mg via TRANSDERMAL
  Filled 2022-09-22: qty 1

## 2022-09-22 MED ORDER — LIDOCAINE HCL (CARDIAC) PF 100 MG/5ML IV SOSY
PREFILLED_SYRINGE | INTRAVENOUS | Status: DC | PRN
  Administered 2022-09-22: 100 mg via INTRAVENOUS

## 2022-09-22 SURGICAL SUPPLY — 56 items
ADH SKN CLS APL DERMABOND .7 (GAUZE/BANDAGES/DRESSINGS) ×1
APL SWBSTK 6 STRL LF DISP (MISCELLANEOUS) ×1
APPLICATOR COTTON TIP 6 STRL (MISCELLANEOUS) IMPLANT
APPLICATOR COTTON TIP 6IN STRL (MISCELLANEOUS) ×1
BLADE SURG SZ10 CARB STEEL (BLADE) IMPLANT
BLADE SURG SZ11 CARB STEEL (BLADE) ×1 IMPLANT
COVER LIGHT HANDLE STERIS (MISCELLANEOUS) ×2 IMPLANT
COVER MAYO STAND XLG (MISCELLANEOUS) IMPLANT
COVER TIP SHEARS 8 DVNC (MISCELLANEOUS) ×1 IMPLANT
DERMABOND ADVANCED .7 DNX12 (GAUZE/BANDAGES/DRESSINGS) ×1 IMPLANT
DRAPE ARM DVNC X/XI (DISPOSABLE) ×4 IMPLANT
DRAPE COLUMN DVNC XI (DISPOSABLE) ×1 IMPLANT
DRIVER NDL MEGA SUTCUT DVNCXI (INSTRUMENTS) ×1 IMPLANT
DRIVER NDLE MEGA SUTCUT DVNCXI (INSTRUMENTS) ×1 IMPLANT
ELECT REM PT RETURN 9FT ADLT (ELECTROSURGICAL) ×1
ELECTRODE REM PT RTRN 9FT ADLT (ELECTROSURGICAL) ×1 IMPLANT
FORCEPS PROGRASP DVNC XI (FORCEP) ×1 IMPLANT
GAUZE 4X4 16PLY ~~LOC~~+RFID DBL (SPONGE) ×2 IMPLANT
GLOVE BIO SURGEON STRL SZ7.5 (GLOVE) IMPLANT
GLOVE BIOGEL PI IND STRL 7.0 (GLOVE) ×3 IMPLANT
GLOVE BIOGEL PI IND STRL 8 (GLOVE) ×2 IMPLANT
GLOVE ECLIPSE 6.5 STRL STRAW (GLOVE) IMPLANT
GLOVE ECLIPSE 8.0 STRL XLNG CF (GLOVE) ×3 IMPLANT
GOWN STRL REUS W/TWL LRG LVL3 (GOWN DISPOSABLE) ×2 IMPLANT
GOWN STRL REUS W/TWL XL LVL3 (GOWN DISPOSABLE) ×2 IMPLANT
GYRUS RUMI II 3.5CM BLUE (DISPOSABLE) ×1
KIT PINK PAD W/HEAD ARE REST (MISCELLANEOUS) ×1
KIT PINK PAD W/HEAD ARM REST (MISCELLANEOUS) ×1 IMPLANT
KIT TURNOVER CYSTO (KITS) ×1 IMPLANT
MANIFOLD NEPTUNE II (INSTRUMENTS) ×1 IMPLANT
NDL INSUFFLATION 14GA 120MM (NEEDLE) ×1 IMPLANT
NEEDLE HYPO 22GX1.5 SAFETY (NEEDLE) ×1 IMPLANT
NEEDLE INSUFFLATION 14GA 120MM (NEEDLE) ×1 IMPLANT
NS IRRIG 500ML POUR BTL (IV SOLUTION) ×1 IMPLANT
OBTURATOR OPTICAL STND 8 DVNC (TROCAR) ×1
OBTURATOR OPTICALSTD 8 DVNC (TROCAR) ×1 IMPLANT
PACK PERI GYN (CUSTOM PROCEDURE TRAY) ×1 IMPLANT
RUMI II GYRUS 3.5CM BLUE (DISPOSABLE) IMPLANT
SCISSORS MNPLR CVD DVNC XI (INSTRUMENTS) ×2 IMPLANT
SEAL CANN UNIV 5-8 DVNC XI (MISCELLANEOUS) ×3 IMPLANT
SEALER VESSEL EXT DVNC XI (MISCELLANEOUS) ×1 IMPLANT
SET BASIN LINEN APH (SET/KITS/TRAYS/PACK) ×1 IMPLANT
SET TRI-LUMEN FLTR TB AIRSEAL (TUBING) ×1 IMPLANT
SPONGE T-LAP 18X18 ~~LOC~~+RFID (SPONGE) ×1 IMPLANT
SUT STRATAFIX 0 PDS+ CT-2 23 (SUTURE) ×1
SUT VICRYL 0 UR6 27IN ABS (SUTURE) ×1 IMPLANT
SUT VICRYL AB 3-0 FS1 BRD 27IN (SUTURE) ×2 IMPLANT
SUTURE STRATFX 0 PDS+ CT-2 23 (SUTURE) ×1 IMPLANT
SYR 10ML LL (SYRINGE) ×2 IMPLANT
SYR 20ML LL LF (SYRINGE) ×1 IMPLANT
SYR 50ML LL SCALE MARK (SYRINGE) ×2 IMPLANT
TIP UTERINE 6.7X10CM GRN DISP (MISCELLANEOUS) IMPLANT
TRAY FOL W/BAG SLVR 16FR STRL (SET/KITS/TRAYS/PACK) IMPLANT
TRAY FOLEY W/BAG SLVR 16FR LF (SET/KITS/TRAYS/PACK) ×1
TROCAR PORT AIRSEAL 8X120 (TROCAR) ×1 IMPLANT
WATER STERILE IRR 500ML POUR (IV SOLUTION) ×1 IMPLANT

## 2022-09-22 NOTE — Op Note (Addendum)
PREOPERATIVE DIAGNOSES: 1.  menometrorrhagia 2.  DUB 3.  dysmenorrhea 4.  dyspareunia 5.  Bulky uterus 250 cc 6.  History of CKC for cervical HSIL   POSTOPERATIVE DIAGNOSES: SAA      OPERATION PERFORMED: 1. Xi Robotic assisted laparoscopic  total hysterectomy  (Tubes were previously surgically absent)   SURGEON: Lazaro Arms, MD     ANESTHESIA: General.   Intraoperative Findings: Bulky uterus, normal ovaries and peritoneum otherwise   Description of operation: Patient was taken to the operating room where she underwent general endotracheal anesthesia She was placed in the low lithotomy position She was prepped and draped in the usual sterile fashion A Foley catheter was placed A RUMI II was sized and  placed into the uterine cavity and the balloon inflated for intraoperative uterine manipulation  An incision was made superior to the umbilicus and dissection taken down to the rectus fascia The fascia was grasped with a single Coker clamp and A Veres needle was placed into the peritoneal cavity with 1 pass without difficulty and a pneumoperitoneum was created with CO2 gas An 8 mm non bladed trocar port was placed into the peritoneal cavity using the video laparoscope. This camera port was used to placed the additional 8 mm ports: left lateral, right lateral, left medial. All the 8 mm ports were placed under direct visualization   The da[employer]inci Xi Robot was then docked and targeted without difficulty I used the following robotic instruments during the case: Vessel sealer extended(bipolar energy), pro grasp, monopolar energy scissors, mega cut needle driver. I used the air seal device in the left medial port and used this as the assist port.  I then went to the operative console    The uterus tubes and ovaries all appeared to be normal There was no intraperitoneal adhesive disease Both ureters and retroperitoneal anatomy were visualized transperitoneally The right  utero-ovarian ligament was transected using the VSE. Additional pedicles were taken which included the round ligament and down the broad ligament laterally to the medial I stopped short of the uterine vessels on the right and then used the monopolar scissors to develop the bladder peritoneum Turned my attention to the left using the VSE to seal and cut the utero-ovarian ligament, the round ligament and the peritoneum of the broad ligament again stopping short of the uterine vessels on the left I then used the monopolar scissors to develop the Vesico uterine peritoneum on the left joining the right The bladder was taken off the lower uterine segment and cervix using tension, blunt dissection and monopolar cautery with good result.  Of course care was taken not to apply monopolar energy to the bladder itself The VSE was then  bilaterally to seal the uterine vessels at multiple levels and to transect at the level of the uterine isthmus The VSE was then used medial to the transected uterine vessels along the cervical edge as well The monopolar scissors were used and anterior and posterior colpotomy incision was made Monopolar scissors and the VSE using bipolar energy were used to develop the lateral cuff preserving the cardinal ligament The posterior colpotomy was made above the uterosacral ligaments to preserve rectovaginal support  All pedicles were found to be hemostatic  The uterus was removed through the vagina via tractin and morcellation thru the vagina The monopolar scissors were removed and Megacut suture driver was placed 2-0 PDS stratafix symmetrical on a CT 2 needle was placed into the peritoneal cavity and used to  closed the vaginal cuff in the usual fashion, running and doubled back providing a 2 layer closure  The uterosacral ligaments were incorporated in the cuff closure to provide apical support and anchoring At the end of the cuff closure there was excellent support including the  uterosacral and cardinal ligaments The pelvis was irrigated and suction applied Again all pedicles were found to be hemostatic  Tissue extraction method:  vaginal with intravaginal morcellation Total Console time:  101 Active instruments time:  70 EBL:  20 cc   I was then finished with the operative console and went back to the bedside All ports were removed under direct visualization And the abdomen was actively desufflated   I closed the subcutaneous tissue of all the incisions I reapproximated the skin with 3-0 vicryl in a subcuticular fashion Dermabond was placed on each incision A total of 266 mg of Exparel was injected into the incision sites equally for postoperative pain management    The patient received gentamicin/clindamycin and toradol 30 mg IV preoperatively   The patient was taken to the PACU in good stable condition   The plan is for discharge from the PACU              Lazaro Arms, MD 09/22/2022 1:54 PM

## 2022-09-22 NOTE — H&P (Signed)
Preoperative History and Physical  Wendy Singh is a 47 y.o. [id number] with Patient's last menstrual period was 06/25/2022. admitted for a robotic assisted hysterectomy and removal of both tubes.   Periods messed up for years Bleeds for 10-14 days at times For the last 6 months almost daily Stopped on megestrol Has severe cramping with bleeding 25% deep dyspareunia(bump) History of HSIL S/P CKC of the cervix PMH:    Past Medical History:  Diagnosis Date   Anemia    Anxiety    Asthma    Complication of anesthesia    severe leg cramps   Complication of anesthesia    excessive couging "unable to catch my breath" when waking up   Depression    Family history of adverse reaction to anesthesia    GERD (gastroesophageal reflux disease)    History of kidney stones    Hypertension    Migraines    Ovarian cyst    PONV (postoperative nausea and vomiting)    Vaginal Pap smear, abnormal     PSH:     Past Surgical History:  Procedure Laterality Date   BIOPSY  01/31/2021   Procedure: BIOPSY;  Surgeon: Jenel Lucksunningham, Scott E, MD;  [facility];  Service: Gastroenterology;;   BREAST BIOPSY  05/2013   BREAST LUMPECTOMY WITH RADIOACTIVE SEED LOCALIZATION Left 05/28/2021   Procedure: LEFT BREAST LUMPECTOMY WITH RADIOACTIVE SEED LOCALIZATION X2;  Surgeon: Abigail MiyamotoBlackman, Douglas, MD;  Location: [facility];  Service: General;  Laterality: Left;   CERVICAL BIOPSY  W/ LOOP ELECTRODE EXCISION     CERVICAL CONIZATION W/BX N/A 09/26/2014   Procedure: CONIZATION CERVIX WITH BIOPSY;  Surgeon: Allie BossierMyra C Dove, MD;  Location: [facility];  Service: Gynecology;  Laterality: N/A;   DIAGNOSTIC LAPAROSCOPY     ESOPHAGOGASTRODUODENOSCOPY (EGD) WITH PROPOFOL N/A 01/31/2021   Procedure: ESOPHAGOGASTRODUODENOSCOPY (EGD) WITH PROPOFOL;  Surgeon: Jenel Lucksunningham, Scott E, MD;  [facility];  Service: Gastroenterology;  Laterality: N/A;   LAPAROSCOPIC BILATERAL SALPINGECTOMY Bilateral  09/26/2014   Procedure: LAPAROSCOPIC BILATERAL SALPINGECTOMY;  Surgeon: Allie BossierMyra C Dove, MD;  Location: [facility];  Service: Gynecology;  Laterality: Bilateral;    POb/GynH:      OB History     Gravida  3   Para  2   Term  2   Preterm      AB  1   Living  2      SAB  1   IAB      Ectopic      Multiple      Live Births  2           SH:   Social History   Tobacco Use   Smoking status: Every Day    Packs/day: 0.25    Years: 17.00    Additional pack years: 0.00    Total pack years: 4.25    Types: Cigarettes   Smokeless tobacco: Never  Vaping Use   Vaping Use: Never used  Substance Use Topics   Alcohol use: Yes    Comment: occasional   Drug use: Yes    Types: Marijuana    Comment: last used 2 days ago    FH:    Family History  Problem Relation Age of Onset   Heart disease Paternal Grandfather    Heart disease Paternal Grandmother    Heart disease Maternal Grandmother    Hypertension Father    Heart disease Father    Cancer Father  throat cancer   Heart disease Mother    Cancer Mother        ovarian cancer   Hypertension Mother    Diabetes Brother      Allergies:  Allergies  Allergen Reactions   Penicillins Rash    Has patient had a PCN reaction causing immediate rash, facial/tongue/throat swelling, SOB or lightheadedness with hypotension: Yes Has patient had a PCN reaction causing severe rash involving mucus membranes or skin necrosis: Yes Has patient had a PCN reaction that required hospitalization: No Has patient had a PCN reaction occurring within the last 10 years: Yes If all of the above answers are "NO", then may proceed with Cephalosporin use.     Medications:       Current Facility-Administered Medications:    clindamycin (CLEOCIN) IVPB 900 mg, 900 mg, Intravenous, 60 min Pre-Op **AND** gentamicin (GARAMYCIN) 550 mg in dextrose 5 % 100 mL IVPB, 5 mg/kg, Intravenous, 60 min Pre-Op, Jyrah Blye, Amaryllis Dyke, MD   lactated ringers  infusion, , Intravenous, Continuous, Kiel, Mosetta Putt, MD, Last Rate: 10 mL/hr at 09/22/22 1003, Continued from Pre-op at 09/22/22 1003   scopolamine (TRANSDERM-SCOP) 1 MG/3DAYS 1.5 mg, 1 patch, Transdermal, Once, Kiel, Mosetta Putt, MD, 1.5 mg at 09/22/22 1610  Facility-Administered Medications Ordered in Other Encounters:    bupivacaine liposome (EXPAREL) 1.3 % injection 266 mg, 20 mL, Infiltration, Once, Lazaro Arms, MD   ketorolac (TORADOL) 30 MG/ML injection 30 mg, 30 mg, Intravenous, Once, Lazaro Arms, MD  Review of Systems:   Review of Systems  Constitutional: Negative for fever, chills, weight loss, malaise/fatigue and diaphoresis.  HENT: Negative for hearing loss, ear pain, nosebleeds, congestion, sore throat, neck pain, tinnitus and ear discharge.   Eyes: Negative for blurred vision, double vision, photophobia, pain, discharge and redness.  Respiratory: Negative for cough, hemoptysis, sputum production, shortness of breath, wheezing and stridor.   Cardiovascular: Negative for chest pain, palpitations, orthopnea, claudication, leg swelling and PND.  Gastrointestinal: Positive for abdominal pain. Negative for heartburn, nausea, vomiting, diarrhea, constipation, blood in stool and melena.  Genitourinary: Negative for dysuria, urgency, frequency, hematuria and flank pain.  Musculoskeletal: Negative for myalgias, back pain, joint pain and falls.  Skin: Negative for itching and rash.  Neurological: Negative for dizziness, tingling, tremors, sensory change, speech change, focal weakness, seizures, loss of consciousness, weakness and headaches.  Endo/Heme/Allergies: Negative for environmental allergies and polydipsia. Does not bruise/bleed easily.  Psychiatric/Behavioral: Negative for depression, suicidal ideas, hallucinations, memory loss and substance abuse. The patient is not nervous/anxious and does not have insomnia.      PHYSICAL EXAM:  Blood pressure (!) 128/90, pulse 78,  temperature 98.4 F (36.9 C), temperature source Oral, resp. rate 14, last menstrual period 06/25/2022, SpO2 99 %.    Vitals reviewed. Constitutional: She is oriented to person, place, and time. She appears well-developed and well-nourished.  HENT:  Head: Normocephalic and atraumatic.  Right Ear: External ear normal.  Left Ear: External ear normal.  Nose: Nose normal.  Mouth/Throat: Oropharynx is clear and moist.  Eyes: Conjunctivae and EOM are normal. Pupils are equal, round, and reactive to light. Right eye exhibits no discharge. Left eye exhibits no discharge. No scleral icterus.  Neck: Normal range of motion. Neck supple. No tracheal deviation present. No thyromegaly present.  Cardiovascular: Normal rate, regular rhythm, normal heart sounds and intact distal pulses.  Exam reveals no gallop and no friction rub.   No murmur heard. Respiratory: Effort normal and breath sounds normal. No  respiratory distress. She has no wheezes. She has no rales. She exhibits no tenderness.  GI: Soft. Bowel sounds are normal. She exhibits no distension and no mass. There is tenderness. There is no rebound and no guarding.  Genitourinary:       Vulva is normal without lesions Vagina is pink moist without discharge Cervix normal in appearance and pap is normal Uterus is normal 250 cc Adnexa is negative with normal sized ovaries by sonogram  Musculoskeletal: Normal range of motion. She exhibits no edema and no tenderness.  Neurological: She is alert and oriented to person, place, and time. She has normal reflexes. She displays normal reflexes. No cranial nerve deficit. She exhibits normal muscle tone. Coordination normal.  Skin: Skin is warm and dry. No rash noted. No erythema. No pallor.  Psychiatric: She has a normal mood and affect. Her behavior is normal. Judgment and thought content normal.    Labs: Results for orders placed or performed during the hospital encounter of 09/21/22 (from the past 336  hour(s))  CBC   Collection Time: 09/21/22  9:20 AM  Result Value Ref Range   WBC 7.2 4.0 - 10.5 K/uL   RBC 4.58 3.87 - 5.11 MIL/uL   Hemoglobin 14.1 12.0 - 15.0 g/dL   HCT 40.9 81.1 - 91.4 %   MCV 90.4 80.0 - 100.0 fL   MCH 30.8 26.0 - 34.0 pg   MCHC 34.1 30.0 - 36.0 g/dL   RDW 78.2 95.6 - 21.3 %   Platelets 222 150 - 400 K/uL   nRBC 0.0 0.0 - 0.2 %  Comprehensive metabolic panel   Collection Time: 09/21/22  9:20 AM  Result Value Ref Range   Sodium 134 (L) 135 - 145 mmol/L   Potassium 3.7 3.5 - 5.1 mmol/L   Chloride 107 98 - 111 mmol/L   CO2 18 (L) 22 - 32 mmol/L   Glucose, Bld 109 (H) 70 - 99 mg/dL   BUN 15 6 - 20 mg/dL   Creatinine, Ser 0.86 0.44 - 1.00 mg/dL   Calcium 57.8 (H) 8.9 - 10.3 mg/dL   Total Protein 7.5 6.5 - 8.1 g/dL   Albumin 4.0 3.5 - 5.0 g/dL   AST 24 15 - 41 U/L   ALT 20 0 - 44 U/L   Alkaline Phosphatase 72 38 - 126 U/L   Total Bilirubin 1.0 0.3 - 1.2 mg/dL   GFR, Estimated >46 >96 mL/min   Anion gap 9 5 - 15  Rapid HIV screen (HIV 1/2 Ab+Ag)   Collection Time: 09/21/22  9:20 AM  Result Value Ref Range   HIV-1 P24 Antigen - HIV24 NON REACTIVE NON REACTIVE   HIV 1/2 Antibodies NON REACTIVE NON REACTIVE   Interpretation (HIV Ag Ab)      A non reactive test result means that HIV 1 or HIV 2 antibodies and HIV 1 p24 antigen were not detected in the specimen.  Urinalysis, Routine w reflex microscopic -Urine, Clean Catch   Collection Time: 09/21/22  9:20 AM  Result Value Ref Range   Color, Urine YELLOW YELLOW   APPearance HAZY (A) CLEAR   Specific Gravity, Urine 1.027 1.005 - 1.030   pH 5.0 5.0 - 8.0   Glucose, UA NEGATIVE NEGATIVE mg/dL   Hgb urine dipstick MODERATE (A) NEGATIVE   Bilirubin Urine NEGATIVE NEGATIVE   Ketones, ur NEGATIVE NEGATIVE mg/dL   Protein, ur NEGATIVE NEGATIVE mg/dL   Nitrite NEGATIVE NEGATIVE   Leukocytes,Ua MODERATE (A) NEGATIVE   RBC /  HPF 11-20 0 - 5 RBC/hpf   WBC, UA 0-5 0 - 5 WBC/hpf   Bacteria, UA NONE SEEN NONE SEEN    Squamous Epithelial / HPF 11-20 0 - 5 /HPF   Mucus PRESENT    Uric Acid Crys, UA PRESENT   Type and screen   Collection Time: 09/21/22  9:20 AM  Result Value Ref Range   ABO/RH(D) A POS    Antibody Screen NEG    Sample Expiration 10/05/2022,2359    Extend sample reason      NO TRANSFUSIONS OR PREGNANCY IN THE PAST 3 MONTHS Performed at [facility], [address] [place], [place] [address]   Pregnancy, urine POC   Collection Time: 09/21/22  9:31 AM  Result Value Ref Range   Preg Test, Ur NEGATIVE NEGATIVE    EKG: Orders placed or performed during the hospital encounter of 06/17/21   ED EKG   ED EKG   EKG 12-Lead   EKG 12-Lead   EKG    Imaging Studies: CT ABDOMEN PELVIS W CONTRAST  Result Date: 08/24/2022 CLINICAL DATA:  RIGHT lower quadrant pain and back pain. Additional suprapubic pain which started today EXAM: CT ABDOMEN AND PELVIS WITH CONTRAST TECHNIQUE: Multidetector CT imaging of the abdomen and pelvis was performed using the standard protocol following bolus administration of intravenous contrast. RADIATION DOSE REDUCTION: This exam was performed according to the departmental dose-optimization program which includes automated exposure control, adjustment of the mA and/or kV according to patient size and/or use of iterative reconstruction technique. CONTRAST:  OMNIPAQUE IOHEXOL 300 MG/ML  SOLN COMPARISON:  CT 01/02/2022 FINDINGS: Lower chest: Lung bases are clear. Hepatobiliary: Tiny 5 mm hypodense lesion in the central liver appears benign. Normal gallbladder and common bile duct. Pancreas: Pancreas is normal. No ductal dilatation. No pancreatic inflammation. 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 Kidneys, ureters and bladder normal. No obstructive uropathy. No bladder calculi. The ureters and bladder normal. Stomach/Bowel: Stomach, small bowel, appendix, and cecum are  normal. The colon and rectosigmoid colon are normal. Vascular/Lymphatic: Abdominal aorta is normal caliber. There is no retroperitoneal or periportal lymphadenopathy. No pelvic lymphadenopathy. Reproductive: Several enhancing lesions in the uterine body. Exophytic enhancing lesion from the uterine fundus measuring 3.3 cm. Findings are unchanged from comparison exam and most consistent with multiple benign leiomyomas. Ovaries are normal. Other: No inguinal hernia or ventral hernia. Musculoskeletal: No aggressive osseous lesion. IMPRESSION: 1. No acute findings in the abdomen pelvis. 2. Normal appendix. 3. No nephrolithiasis or ureterolithiasis. 4. Several intramural and exophytic uterine leiomyomas. Electronically Signed   By: Genevive Bi M.D.   On: 08/24/2022 15:46      Assessment: 1. Menometrorrhagia  N92.1       2. DUB (dysfunctional uterine bleeding)  N93.8       3. Dysmenorrhea  N94.6       4. Dyspareunia in female, 25%, deep  N94.10       5. Bulky or enlarged uterus, 250 grams, ice cream type  N85.2         Plan: Robotic assisted hysterectomy with removal of both tubes  Pt understands the risks of surgery including but not limited t  excessive bleeding requiring transfusion or reoperation, post-operative infection requiring prolonged hospitalization or re-hospitalization and antibiotic therapy, and damage to other organs including bladder, bowel, ureters and major vessels.  The patient also understands the alternative treatment options which were discussed in full.  All questions were answered.  Amaryllis Dyke Aisa Schoeppner 09/22/2022 10:08 AM   Lazaro Arms 09/22/2022 10:06 AM

## 2022-09-22 NOTE — Transfer of Care (Signed)
Immediate Anesthesia Transfer of Care Note  Patient: Wendy Singh  Procedure(s) Performed: XI ROBOTIC ASSISTED LAPAROSCOPIC HYSTERECTOMY (Bilateral: Abdomen)  Patient Location: PACU  Anesthesia Type:General  Level of Consciousness: sedated and patient cooperative  Airway & Oxygen Therapy: Patient Spontanous Breathing and Patient connected to nasal cannula oxygen  Post-op Assessment: Report given to RN and Post -op Vital signs reviewed and stable  Post vital signs: Reviewed and stable  Last Vitals:  Vitals Value Taken Time  BP 102/42 1352 09/22/22  Temp 97.7 1352  09/22/22  Pulse 73 1352 09/22/22  Resp 28 135204/09/24  SpO2 100 135204/09/24    Last Pain:  Vitals:   09/22/22 0859  TempSrc: Oral  PainSc: 4          Complications: No notable events documented.

## 2022-09-22 NOTE — Anesthesia Preprocedure Evaluation (Signed)
Anesthesia Evaluation  Patient identified by MRN, date of birth, ID band Patient awake    Reviewed: Allergy & Precautions, H&P , NPO status , Patient's Chart, lab work & pertinent test results, reviewed documented beta blocker date and time   History of Anesthesia Complications (+) PONV, Family history of anesthesia reaction and history of anesthetic complications  Airway Mallampati: II  TM Distance: >3 FB Neck ROM: full    Dental no notable dental hx.    Pulmonary neg pulmonary ROS, asthma , Current Smoker   Pulmonary exam normal breath sounds clear to auscultation       Cardiovascular Exercise Tolerance: Good hypertension, negative cardio ROS  Rhythm:regular Rate:Normal     Neuro/Psych  Headaches PSYCHIATRIC DISORDERS Anxiety Depression    negative neurological ROS  negative psych ROS   GI/Hepatic negative GI ROS, Neg liver ROS,GERD  ,,  Endo/Other  negative endocrine ROS    Renal/GU negative Renal ROS  negative genitourinary   Musculoskeletal   Abdominal   Peds  Hematology negative hematology ROS (+) Blood dyscrasia, anemia   Anesthesia Other Findings   Reproductive/Obstetrics negative OB ROS                             Anesthesia Physical Anesthesia Plan  ASA: 2  Anesthesia Plan: General and General ETT   Post-op Pain Management:    Induction:   PONV Risk Score and Plan: Ondansetron  Airway Management Planned:   Additional Equipment:   Intra-op Plan:   Post-operative Plan:   Informed Consent: I have reviewed the patients History and Physical, chart, labs and discussed the procedure including the risks, benefits and alternatives for the proposed anesthesia with the patient or authorized representative who has indicated his/her understanding and acceptance.     Dental Advisory Given  Plan Discussed with: CRNA  Anesthesia Plan Comments:        Anesthesia Quick  Evaluation

## 2022-09-22 NOTE — Anesthesia Procedure Notes (Signed)
Procedure Name: Intubation Date/Time: 09/22/2022 10:37 AM  Performed by: Franco Nones, CRNAPre-anesthesia Checklist: Patient identified, Patient being monitored, Timeout performed, Emergency Drugs available and Suction available Patient Re-evaluated:Patient Re-evaluated prior to induction Oxygen Delivery Method: Circle system utilized Preoxygenation: Pre-oxygenation with 100% oxygen Induction Type: IV induction Ventilation: Mask ventilation without difficulty Laryngoscope Size: Mac and 3 Grade View: Grade I Tube type: Oral Tube size: 7.0 mm Number of attempts: 1 Airway Equipment and Method: Stylet Placement Confirmation: ETT inserted through vocal cords under direct vision, positive ETCO2 and breath sounds checked- equal and bilateral Secured at: 22 cm Tube secured with: Tape Dental Injury: Teeth and Oropharynx as per pre-operative assessment

## 2022-09-24 DIAGNOSIS — Z029 Encounter for administrative examinations, unspecified: Secondary | ICD-10-CM

## 2022-09-24 LAB — SURGICAL PATHOLOGY

## 2022-09-24 NOTE — Anesthesia Postprocedure Evaluation (Signed)
Anesthesia Post Note  Patient: Wendy Singh  Procedure(s) Performed: XI ROBOTIC ASSISTED LAPAROSCOPIC HYSTERECTOMY (Abdomen)  Patient location during evaluation: Phase II Anesthesia Type: General Level of consciousness: awake Pain management: pain level controlled Vital Signs Assessment: post-procedure vital signs reviewed and stable Respiratory status: spontaneous breathing and respiratory function stable Cardiovascular status: blood pressure returned to baseline and stable Postop Assessment: no headache and no apparent nausea or vomiting Anesthetic complications: no Comments: Late entry   No notable events documented.   Last Vitals:  Vitals:   09/22/22 1530 09/22/22 1545  BP: 127/77 114/68  Pulse: 74 80  Resp: 13 17  Temp:  36.8 C  SpO2: 100% 100%    Last Pain:  Vitals:   09/22/22 1600  TempSrc:   PainSc: 4                  Windell Norfolk

## 2022-09-29 ENCOUNTER — Encounter (HOSPITAL_COMMUNITY): Payer: Self-pay | Admitting: Obstetrics & Gynecology

## 2022-09-30 ENCOUNTER — Other Ambulatory Visit (INDEPENDENT_AMBULATORY_CARE_PROVIDER_SITE_OTHER): Payer: Self-pay | Admitting: *Deleted

## 2022-09-30 DIAGNOSIS — R109 Unspecified abdominal pain: Secondary | ICD-10-CM

## 2022-09-30 DIAGNOSIS — R350 Frequency of micturition: Secondary | ICD-10-CM

## 2022-09-30 LAB — POCT URINALYSIS DIPSTICK OB
Glucose, UA: NEGATIVE
Ketones, UA: NEGATIVE
Leukocytes, UA: NEGATIVE
Nitrite, UA: NEGATIVE
POC,PROTEIN,UA: NEGATIVE

## 2022-09-30 NOTE — Progress Notes (Signed)
   NURSE VISIT- UTI SYMPTOMS   SUBJECTIVE:  Wendy Singh is a 47 y.o. G77P2012 female here for UTI symptoms. She is a GYN patient. She reports flank pain on the left and urinary frequency.  OBJECTIVE:  LMP 06/25/2022 Comment: Urine pregnancy negative on 09-21-2022  Appears well, in no apparent distress  No results found for this or any previous visit (from the past 24 hour(s)).  ASSESSMENT: GYN patient with UTI symptoms and negative nitrites  PLAN: Note routed to Dr. Charlotta Newton   Rx sent by provider today: No Urine culture sent Call or return to clinic prn if these symptoms worsen or fail to improve as anticipated. Follow-up: as scheduled   Wendy Singh  09/30/2022 8:55 AM

## 2022-10-02 LAB — URINE CULTURE

## 2022-10-05 ENCOUNTER — Ambulatory Visit (INDEPENDENT_AMBULATORY_CARE_PROVIDER_SITE_OTHER): Payer: Self-pay | Admitting: Obstetrics & Gynecology

## 2022-10-05 ENCOUNTER — Encounter: Payer: Self-pay | Admitting: Obstetrics & Gynecology

## 2022-10-05 VITALS — BP 123/76 | HR 77 | Ht 71.0 in | Wt 238.0 lb

## 2022-10-05 DIAGNOSIS — Z9889 Other specified postprocedural states: Secondary | ICD-10-CM

## 2022-10-05 MED ORDER — KETOROLAC TROMETHAMINE 10 MG PO TABS: 10.0000 mg | ORAL_TABLET | Freq: Three times a day (TID) | ORAL | 0 refills | Status: DC | PRN

## 2022-10-05 MED ORDER — CYCLOBENZAPRINE HCL 10 MG PO TABS: 10.0000 mg | ORAL_TABLET | Freq: Three times a day (TID) | ORAL | 0 refills | Status: DC | PRN

## 2022-10-05 MED ORDER — OXYCODONE-ACETAMINOPHEN 5-325 MG PO TABS: 1.0000 | ORAL_TABLET | ORAL | 0 refills | Status: DC | PRN

## 2022-10-05 NOTE — Progress Notes (Signed)
  HPI: Patient returns for routine postoperative follow-up having undergone robotic hysterectomy  on 09/22/22.  The patient's immediate postoperative recovery has been unremarkable. Since hospital discharge the patient reports some left sided back pain and right sided rib pain.   Current Outpatient Medications: acetaminophen (TYLENOL) 500 MG tablet, Take 1,000 mg by mouth every 6 (six) hours as needed for moderate pain., Disp: , Rfl:  ascorbic acid (VITAMIN C) 500 MG tablet, Take 500 mg by mouth daily., Disp: , Rfl:  PROVENTIL HFA 108 (90 Base) MCG/ACT inhaler, INHALE 2 PUFFS BY MOUTH EVERY 6 HOURS AS NEEDED FOR WHEEZING OR SHORTNESS OF BREATH (Patient taking differently: Inhale 2 puffs into the lungs every 6 (six) hours as needed.), Disp: 20.1 g, Rfl: 1 Zinc 50 MG TABS, Take 1 tablet by mouth every other day., Disp: , Rfl:  ciprofloxacin (CIPRO) 500 MG tablet, Take 1 tablet (500 mg total) by mouth 2 (two) times daily. (Patient not taking: Reported on 10/05/2022), Disp: 14 tablet, Rfl: 0 ketorolac (TORADOL) 10 MG tablet, Take 1 tablet (10 mg total) by mouth every 8 (eight) hours as needed. (Patient not taking: Reported on 10/05/2022), Disp: 15 tablet, Rfl: 0 ondansetron (ZOFRAN-ODT) 8 MG disintegrating tablet, Take 1 tablet (8 mg total) by mouth every 8 (eight) hours as needed for nausea or vomiting. (Patient not taking: Reported on 10/05/2022), Disp: 8 tablet, Rfl: 0 oxyCODONE-acetaminophen (PERCOCET) 7.5-325 MG tablet, Take 1 tablet by mouth every 6 (six) hours as needed. (Patient not taking: Reported on 10/05/2022), Disp: 28 tablet, Rfl: 0 pantoprazole (PROTONIX) 20 MG tablet, Take 1 tablet (20 mg total) by mouth daily. (Patient not taking: Reported on 08/24/2022), Disp: 90 tablet, Rfl: 0  No current facility-administered medications for this visit. Facility-Administered Medications Ordered in Other Visits: bupivacaine liposome (EXPAREL) 1.3 % injection 266 mg, 20 mL, Infiltration, Once, Roshni Burbano, Amaryllis Dyke, MD     Blood pressure 123/76, pulse 77, height  (1.803 m), weight 238 lb (108 kg), last menstrual period 06/25/2022.  Physical Exam: All 4 incisions look good Cuff intact healing well no bleeding noted Bimanual is negative  Diagnostic Tests:   Pathology: benign  Impression + Management plan:   ICD-10-CM   1. Post-operative state: S/P robotic assisted hysterectomy 09/22/22  Z98.890          Medications Prescribed this encounter: No orders of the defined types were placed in this encounter.     Follow up: No follow-ups on file.    Lazaro Arms, MD Attending Physician for the Center for [facility] 10/05/2022 5:08 PM

## 2022-10-06 ENCOUNTER — Encounter: Payer: Self-pay | Admitting: *Deleted

## 2022-10-30 ENCOUNTER — Telehealth: Payer: Self-pay

## 2022-10-30 NOTE — Telephone Encounter (Signed)
Patient was referred by her doctor at [facility] to call the [employer] office to see if she is eligible for our program. We scheduled an appt for the patient to come into our office on Monday 5/20 at 2:30 to enroll into our program and provide additional documents to begin [employer] application as well. Patient also plans to see if she is eligible for Medicaid

## 2022-11-06 ENCOUNTER — Encounter: Payer: Self-pay | Admitting: Obstetrics & Gynecology

## 2022-11-06 ENCOUNTER — Ambulatory Visit (INDEPENDENT_AMBULATORY_CARE_PROVIDER_SITE_OTHER): Payer: Self-pay | Admitting: Obstetrics & Gynecology

## 2022-11-06 VITALS — BP 134/96 | HR 77 | Ht 71.0 in | Wt 244.0 lb

## 2022-11-06 DIAGNOSIS — Z9889 Other specified postprocedural states: Secondary | ICD-10-CM

## 2022-11-06 NOTE — Progress Notes (Signed)
  HPI: Patient returns for routine postoperative follow-up having undergone robot TLH BS on 09/22/22.  The patient's immediate postoperative recovery has been unremarkable. Since hospital discharge the patient reports no problems.   Current Outpatient Medications: acetaminophen (TYLENOL) 500 MG tablet, Take 1,000 mg by mouth every 6 (six) hours as needed for moderate pain. (Patient not taking: Reported on 11/06/2022), Disp: , Rfl:  ascorbic acid (VITAMIN C) 500 MG tablet, Take 500 mg by mouth daily. (Patient not taking: Reported on 11/06/2022), Disp: , Rfl:  ciprofloxacin (CIPRO) 500 MG tablet, Take 1 tablet (500 mg total) by mouth 2 (two) times daily. (Patient not taking: Reported on 10/05/2022), Disp: 14 tablet, Rfl: 0 cyclobenzaprine (FLEXERIL) 10 MG tablet, Take 1 tablet (10 mg total) by mouth every 8 (eight) hours as needed for muscle spasms. (Patient not taking: Reported on 11/06/2022), Disp: 30 tablet, Rfl: 0 ketorolac (TORADOL) 10 MG tablet, Take 1 tablet (10 mg total) by mouth every 8 (eight) hours as needed. (Patient not taking: Reported on 11/06/2022), Disp: 15 tablet, Rfl: 0 ondansetron (ZOFRAN-ODT) 8 MG disintegrating tablet, Take 1 tablet (8 mg total) by mouth every 8 (eight) hours as needed for nausea or vomiting. (Patient not taking: Reported on 10/05/2022), Disp: 8 tablet, Rfl: 0 oxyCODONE-acetaminophen (PERCOCET) 7.5-325 MG tablet, Take 1 tablet by mouth every 6 (six) hours as needed. (Patient not taking: Reported on 10/05/2022), Disp: 28 tablet, Rfl: 0 oxyCODONE-acetaminophen (PERCOCET/ROXICET) 5-325 MG tablet, Take 1 tablet by mouth every 4 (four) hours as needed for severe pain. (Patient not taking: Reported on 11/06/2022), Disp: 15 tablet, Rfl: 0 pantoprazole (PROTONIX) 20 MG tablet, Take 1 tablet (20 mg total) by mouth daily. (Patient not taking: Reported on 08/24/2022), Disp: 90 tablet, Rfl: 0 PROVENTIL HFA 108 (90 Base) MCG/ACT inhaler, INHALE 2 PUFFS BY MOUTH EVERY 6 HOURS AS NEEDED  FOR WHEEZING OR SHORTNESS OF BREATH (Patient not taking: Reported on 11/06/2022), Disp: 20.1 g, Rfl: 1 Zinc 50 MG TABS, Take 1 tablet by mouth every other day. (Patient not taking: Reported on 11/06/2022), Disp: , Rfl:   No current facility-administered medications for this visit. Facility-Administered Medications Ordered in Other Visits: bupivacaine liposome (EXPAREL) 1.3 % injection 266 mg, 20 mL, Infiltration, Once, Jacquita Mulhearn, Amaryllis Dyke, MD     Blood pressure (!) 151/94, pulse 77, height 5\' 11"  (1.803 m), weight 244 lb (110.7 kg), last menstrual period 06/25/2022.  Physical Exam: 4 incisions all look good 2 superficial sutures were clipped  Diagnostic Tests:   Pathology: benign  Impression + Management plan:   ICD-10-CM   1. Post-operative state: S/P robotic assisted hysterectomy 09/22/22  Z98.890    normal post op course         Medications Prescribed this encounter: No orders of the defined types were placed in this encounter.     Follow up: No follow-ups on file.    Lazaro Arms, MD Attending Physician for the Center for [facility] and [facility] 11/06/2022 12:48 PM

## 2022-12-18 ENCOUNTER — Encounter: Payer: Self-pay | Admitting: Obstetrics & Gynecology

## 2022-12-18 ENCOUNTER — Ambulatory Visit (INDEPENDENT_AMBULATORY_CARE_PROVIDER_SITE_OTHER): Payer: Self-pay | Admitting: Obstetrics & Gynecology

## 2022-12-18 VITALS — BP 127/79 | HR 74 | Ht 71.0 in

## 2022-12-18 DIAGNOSIS — Z9889 Other specified postprocedural states: Secondary | ICD-10-CM

## 2022-12-18 NOTE — Progress Notes (Signed)
  HPI: Patient returns for routine postoperative follow-up having undergone RA TLH 09/22/22 on .  The patient's immediate postoperative recovery has been unremarkable. Since hospital discharge the patient reports no rpbolems.   Current Outpatient Medications: acetaminophen (TYLENOL) 500 MG tablet, Take 1,000 mg by mouth every 6 (six) hours as needed for moderate pain. (Patient not taking: Reported on 11/06/2022), Disp: , Rfl:  ascorbic acid (VITAMIN C) 500 MG tablet, Take 500 mg by mouth daily. (Patient not taking: Reported on 11/06/2022), Disp: , Rfl:  cyclobenzaprine (FLEXERIL) 10 MG tablet, Take 1 tablet (10 mg total) by mouth every 8 (eight) hours as needed for muscle spasms. (Patient not taking: Reported on 11/06/2022), Disp: 30 tablet, Rfl: 0 ondansetron (ZOFRAN-ODT) 8 MG disintegrating tablet, Take 1 tablet (8 mg total) by mouth every 8 (eight) hours as needed for nausea or vomiting. (Patient not taking: Reported on 10/05/2022), Disp: 8 tablet, Rfl: 0 oxyCODONE-acetaminophen (PERCOCET) 7.5-325 MG tablet, Take 1 tablet by mouth every 6 (six) hours as needed. (Patient not taking: Reported on 10/05/2022), Disp: 28 tablet, Rfl: 0 oxyCODONE-acetaminophen (PERCOCET/ROXICET) 5-325 MG tablet, Take 1 tablet by mouth every 4 (four) hours as needed for severe pain. (Patient not taking: Reported on 11/06/2022), Disp: 15 tablet, Rfl: 0 pantoprazole (PROTONIX) 20 MG tablet, Take 1 tablet (20 mg total) by mouth daily. (Patient not taking: Reported on 08/24/2022), Disp: 90 tablet, Rfl: 0 PROVENTIL HFA 108 (90 Base) MCG/ACT inhaler, INHALE 2 PUFFS BY MOUTH EVERY 6 HOURS AS NEEDED FOR WHEEZING OR SHORTNESS OF BREATH (Patient not taking: Reported on 11/06/2022), Disp: 20.1 g, Rfl: 1 Zinc 50 MG TABS, Take 1 tablet by mouth every other day. (Patient not taking: Reported on 11/06/2022), Disp: , Rfl:   No current facility-administered medications for this visit. Facility-Administered Medications Ordered in Other  Visits: bupivacaine liposome (EXPAREL) 1.3 % injection 266 mg, 20 mL, Infiltration, Once, Lazaro Arms, MD     Blood pressure 127/79, pulse 74, height 5\' 11"  (1.803 m), last menstrual period 06/25/2022.  Physical Exam: Incisions x 4 all well healed Vaginal cuff well healed  Diagnostic Tests:   Pathology: benign  Impression + Management plan:   ICD-10-CM   1. Post-operative state: S/P RA [facility] 09/22/22  Z98.890          Medications Prescribed this encounter: No orders of the defined types were placed in this encounter.     Follow up: No follow-ups on file.    Lazaro Arms, MD Attending Physician for the Center for [facility] and [facility] 12/18/2022 11:43 AM

## 2023-07-15 ENCOUNTER — Encounter (HOSPITAL_COMMUNITY): Payer: Self-pay | Admitting: Emergency Medicine

## 2023-07-15 ENCOUNTER — Emergency Department (HOSPITAL_COMMUNITY)
Admission: EM | Admit: 2023-07-15 | Discharge: 2023-07-16 | Discharge status: Against Medical Advice | Payer: Self-pay | Attending: Emergency Medicine | Admitting: Emergency Medicine

## 2023-07-15 ENCOUNTER — Other Ambulatory Visit: Payer: Self-pay

## 2023-07-15 ENCOUNTER — Emergency Department (HOSPITAL_COMMUNITY): Payer: Self-pay

## 2023-07-15 DIAGNOSIS — M79602 Pain in left arm: Secondary | ICD-10-CM | POA: Insufficient documentation

## 2023-07-15 DIAGNOSIS — R079 Chest pain, unspecified: Secondary | ICD-10-CM | POA: Insufficient documentation

## 2023-07-15 DIAGNOSIS — Z5321 Procedure and treatment not carried out due to patient leaving prior to being seen by health care provider: Secondary | ICD-10-CM | POA: Insufficient documentation

## 2023-07-15 DIAGNOSIS — M542 Cervicalgia: Secondary | ICD-10-CM | POA: Insufficient documentation

## 2023-07-15 DIAGNOSIS — R519 Headache, unspecified: Secondary | ICD-10-CM | POA: Insufficient documentation

## 2023-07-15 LAB — BASIC METABOLIC PANEL
Anion gap: 10 (ref 5–15)
BUN: 12 mg/dL (ref 6–20)
CO2: 26 mmol/L (ref 22–32)
Calcium: 10.7 mg/dL — ABNORMAL HIGH (ref 8.9–10.3)
Chloride: 103 mmol/L (ref 98–111)
Creatinine, Ser: 0.86 mg/dL (ref 0.44–1.00)
GFR, Estimated: >60 mL/min (ref >60)
Glucose, Bld: 104 mg/dL — ABNORMAL HIGH (ref 70–99)
Potassium: 3.9 mmol/L (ref 3.5–5.1)
Sodium: 139 mmol/L (ref 135–145)

## 2023-07-15 LAB — CBC
HCT: 43.3 % (ref 36.0–46.0)
Hemoglobin: 14.8 g/dL (ref 12.0–15.0)
MCH: 31.9 pg (ref 26.0–34.0)
MCHC: 34.2 g/dL (ref 30.0–36.0)
MCV: 93.3 fL (ref 80.0–100.0)
Platelets: 231 10*3/uL (ref 150–400)
RBC: 4.64 MIL/uL (ref 3.87–5.11)
RDW: 12.1 % (ref 11.5–15.5)
WBC: 9.2 10*3/uL (ref 4.0–10.5)
nRBC: 0 % (ref 0.0–0.2)

## 2023-07-15 LAB — TROPONIN I (HIGH SENSITIVITY)
Troponin I (High Sensitivity): 2 ng/L (ref <18)
Troponin I (High Sensitivity): 2 ng/L (ref <18)

## 2023-07-15 NOTE — ED Triage Notes (Signed)
Pt c/o intermittent chest pain x 2 days, states that the pain is now more chronic with headache, neck pain, and left arm "feels weird."

## 2023-10-25 ENCOUNTER — Emergency Department (HOSPITAL_COMMUNITY): Payer: Self-pay

## 2023-10-25 ENCOUNTER — Other Ambulatory Visit: Payer: Self-pay

## 2023-10-25 ENCOUNTER — Encounter (HOSPITAL_COMMUNITY): Payer: Self-pay

## 2023-10-25 ENCOUNTER — Emergency Department (HOSPITAL_COMMUNITY)
Admission: EM | Admit: 2023-10-25 | Discharge: 2023-10-25 | Disposition: A | Discharge status: Home / Self Care | Payer: Self-pay | Attending: Emergency Medicine | Admitting: Emergency Medicine

## 2023-10-25 DIAGNOSIS — R1031 Right lower quadrant pain: Secondary | ICD-10-CM | POA: Insufficient documentation

## 2023-10-25 DIAGNOSIS — R197 Diarrhea, unspecified: Secondary | ICD-10-CM | POA: Insufficient documentation

## 2023-10-25 DIAGNOSIS — M545 Low back pain, unspecified: Secondary | ICD-10-CM | POA: Insufficient documentation

## 2023-10-25 DIAGNOSIS — R110 Nausea: Secondary | ICD-10-CM | POA: Insufficient documentation

## 2023-10-25 DIAGNOSIS — R350 Frequency of micturition: Secondary | ICD-10-CM | POA: Insufficient documentation

## 2023-10-25 LAB — CBC WITH DIFFERENTIAL/PLATELET
Abs Immature Granulocytes: 0.02 10*3/uL (ref 0.00–0.07)
Basophils Absolute: 0 10*3/uL (ref 0.0–0.1)
Basophils Relative: 1 %
Eosinophils Absolute: 0.2 10*3/uL (ref 0.0–0.5)
Eosinophils Relative: 2 %
HCT: 42.4 % (ref 36.0–46.0)
Hemoglobin: 14 g/dL (ref 12.0–15.0)
Immature Granulocytes: 0 %
Lymphocytes Relative: 32 %
Lymphs Abs: 2.4 10*3/uL (ref 0.7–4.0)
MCH: 31.5 pg (ref 26.0–34.0)
MCHC: 33 g/dL (ref 30.0–36.0)
MCV: 95.3 fL (ref 80.0–100.0)
Monocytes Absolute: 0.7 10*3/uL (ref 0.1–1.0)
Monocytes Relative: 9 %
Neutro Abs: 4.2 10*3/uL (ref 1.7–7.7)
Neutrophils Relative %: 56 %
Platelets: 187 10*3/uL (ref 150–400)
RBC: 4.45 MIL/uL (ref 3.87–5.11)
RDW: 12.4 % (ref 11.5–15.5)
WBC: 7.5 10*3/uL (ref 4.0–10.5)
nRBC: 0 % (ref 0.0–0.2)

## 2023-10-25 LAB — URINALYSIS, ROUTINE W REFLEX MICROSCOPIC
Bacteria, UA: NONE SEEN
Bilirubin Urine: NEGATIVE
Glucose, UA: NEGATIVE mg/dL
Ketones, ur: NEGATIVE mg/dL
Leukocytes,Ua: NEGATIVE
Nitrite: NEGATIVE
Protein, ur: NEGATIVE mg/dL
Specific Gravity, Urine: 1.016 (ref 1.005–1.030)
pH: 5 (ref 5.0–8.0)

## 2023-10-25 LAB — COMPREHENSIVE METABOLIC PANEL WITH GFR
ALT: 27 U/L (ref 0–44)
AST: 19 U/L (ref 15–41)
Albumin: 3.4 g/dL — ABNORMAL LOW (ref 3.5–5.0)
Alkaline Phosphatase: 72 U/L (ref 38–126)
Anion gap: 7 (ref 5–15)
BUN: 14 mg/dL (ref 6–20)
CO2: 24 mmol/L (ref 22–32)
Calcium: 10.1 mg/dL (ref 8.9–10.3)
Chloride: 106 mmol/L (ref 98–111)
Creatinine, Ser: 0.79 mg/dL (ref 0.44–1.00)
GFR, Estimated: >60 mL/min (ref >60)
Glucose, Bld: 105 mg/dL — ABNORMAL HIGH (ref 70–99)
Potassium: 4.3 mmol/L (ref 3.5–5.1)
Sodium: 137 mmol/L (ref 135–145)
Total Bilirubin: 0.5 mg/dL (ref 0.0–1.2)
Total Protein: 6.9 g/dL (ref 6.5–8.1)

## 2023-10-25 MED ORDER — ONDANSETRON 8 MG PO TBDP
8.0000 mg | ORAL_TABLET | Freq: Once | ORAL | Status: AC
  Administered 2023-10-25: 8 mg via ORAL
  Filled 2023-10-25: qty 1

## 2023-10-25 MED ORDER — METHOCARBAMOL 500 MG PO TABS: 500.0000 mg | ORAL_TABLET | Freq: Three times a day (TID) | ORAL | 0 refills | Status: AC

## 2023-10-25 MED ORDER — PREDNISONE 10 MG PO TABS: ORAL_TABLET | ORAL | 0 refills | Status: AC

## 2023-10-25 MED ORDER — OXYCODONE-ACETAMINOPHEN 5-325 MG PO TABS: 1.0000 | ORAL_TABLET | ORAL | 0 refills | Status: AC | PRN

## 2023-10-25 MED ORDER — OXYCODONE-ACETAMINOPHEN 5-325 MG PO TABS
1.0000 | ORAL_TABLET | Freq: Once | ORAL | Status: AC
  Administered 2023-10-25: 1 via ORAL
  Filled 2023-10-25: qty 1

## 2023-10-25 NOTE — Discharge Instructions (Signed)
 Your symptoms today are likely coming from your back.  I recommend rest, you may alternate ice and heat to the affected area of your back.  Take the medication as directed.  You may also try the over-the-counter 4% lidocaine  patches as directed.  Follow-up with your primary care provider for recheck or return to the emergency department if you develop any new or worsening symptoms

## 2023-10-25 NOTE — ED Notes (Signed)
 Patient transported to CT

## 2023-10-25 NOTE — ED Triage Notes (Signed)
 Pt arrived with c.o of back pain that now goes around to her abd since Sat. Pt also c/o frequent urination, with nausea and diarrhea.

## 2023-10-26 NOTE — ED Provider Notes (Signed)
 [place] EMERGENCY DEPARTMENT AT [facility] Provider Note   CSN: [id number] Arrival date & time: 10/25/23  5643     History  Chief Complaint  Patient presents with   Flank Pain    Wendy Singh is a 48 y.o. female.   Flank Pain Associated symptoms include abdominal pain. Pertinent negatives include no chest pain, no headaches and no shortness of breath.       Wendy Singh is a 48 y.o. female who presents to the Emergency Department complaining of right sided low back and flank pain.  Symptoms present for 2 days.  Initially, pain was localized to her mid lower back, then began to radiate toward her flank and lower abdomen. Pain has been associated with some increased frequency of urination, nausea and diarrhea.  She has history of kidney stones , but states pain does not feel as intense as with a kidney stone.  She denies known injury, but does endorse heavy lifting at her work.  She denies fever, vomiting, hematuria, pain with urination. No pain radiating into her leg.     Home Medications Prior to Admission medications   Medication Sig Start Date End Date Taking? Authorizing Provider  methocarbamol  (ROBAXIN ) 500 MG tablet Take 1 tablet (500 mg total) by mouth 3 (three) times daily. 10/25/23  Yes Bartlomiej Jenkinson, PA-C  oxyCODONE -acetaminophen  (PERCOCET/ROXICET) 5-325 MG tablet Take 1 tablet by mouth every 4 (four) hours as needed. 10/25/23  Yes Gabriella Guile, PA-C  predniSONE  (DELTASONE ) 10 MG tablet Take 6 tablets day one, 5 tablets day two, 4 tablets day three, 3 tablets day four, 2 tablets day five, then 1 tablet day six 10/25/23  Yes Kasin Tonkinson, PA-C  ascorbic acid (VITAMIN C) 500 MG tablet Take 500 mg by mouth daily. Patient not taking: Reported on 11/06/2022    [provider]  ondansetron  (ZOFRAN -ODT) 8 MG disintegrating tablet Take 1 tablet (8 mg total) by mouth every 8 (eight) hours as needed for nausea or vomiting. Patient not taking:  Reported on 10/05/2022 09/22/22   Wendelyn Halter, MD  pantoprazole  (PROTONIX ) 20 MG tablet Take 1 tablet (20 mg total) by mouth daily. Patient not taking: Reported on 08/24/2022 01/29/21   Delray Fielding, PA-C  PROVENTIL  HFA 108 (90 Base) MCG/ACT inhaler INHALE 2 PUFFS BY MOUTH EVERY 6 HOURS AS NEEDED FOR WHEEZING OR SHORTNESS OF BREATH Patient not taking: Reported on 11/06/2022 10/17/19   Luane Rumps, PA-C  Zinc 50 MG TABS Take 1 tablet by mouth every other day. Patient not taking: Reported on 11/06/2022    [provider]      Allergies    Penicillins    Review of Systems   Review of Systems  Constitutional:  Negative for chills and fever.  Respiratory:  Negative for chest tightness, shortness of breath and wheezing.   Cardiovascular:  Negative for chest pain.  Gastrointestinal:  Positive for abdominal pain, diarrhea and nausea. Negative for vomiting.  Genitourinary:  Positive for flank pain and frequency. Negative for dysuria, hematuria, vaginal bleeding and vaginal discharge.  Musculoskeletal:  Positive for back pain.  Neurological:  Negative for dizziness and headaches.    Physical Exam Updated Vital Signs BP (!) 119/95   Pulse 60   Temp 97.8 F (36.6 C) (Oral)   Resp 17   Ht 6' (1.829 m)   Wt 104.3 kg   LMP 06/25/2022 Comment: Urine pregnancy negative on 09-21-2022  SpO2 96%   BMI 31.19 kg/m  Physical Exam  Vitals and nursing note reviewed.  Constitutional:      General: She is not in acute distress.    Appearance: Normal appearance. She is not ill-appearing or toxic-appearing.  HENT:     Mouth/Throat:     Mouth: Mucous membranes are moist.  Cardiovascular:     Rate and Rhythm: Normal rate and regular rhythm.     Pulses: Normal pulses.  Pulmonary:     Effort: Pulmonary effort is normal.     Breath sounds: Normal breath sounds.  Abdominal:     Palpations: Abdomen is soft.     Tenderness: There is no abdominal tenderness. There is no right CVA tenderness, left  CVA tenderness or guarding.  Musculoskeletal:     Lumbar back: Tenderness present. No signs of trauma. Normal range of motion. Positive right straight leg raise test. Negative left straight leg raise test.     Comments: Ttp lower midline spine, right mid to lower lumbar paraspinal muscles.    Skin:    General: Skin is warm.     Capillary Refill: Capillary refill takes less than 2 seconds.  Neurological:     General: No focal deficit present.     Mental Status: She is alert.     Sensory: Sensation is intact. No sensory deficit.     Motor: Motor function is intact. No weakness.     Coordination: Coordination is intact.     ED Results / Procedures / Treatments   Labs (all labs ordered are listed, but only abnormal results are displayed) Labs Reviewed  URINALYSIS, ROUTINE W REFLEX MICROSCOPIC - Abnormal; Notable for the following components:      Result Value   Hgb urine dipstick SMALL (*)    All other components within normal limits  COMPREHENSIVE METABOLIC PANEL WITH GFR - Abnormal; Notable for the following components:   Glucose, Bld 105 (*)    Albumin 3.4 (*)    All other components within normal limits  CBC WITH DIFFERENTIAL/PLATELET    EKG None  Radiology CT L-SPINE NO CHARGE Result Date: 10/25/2023 CLINICAL DATA:  .  Back pain. EXAM: CT Lumbar Spine without contrast TECHNIQUE: Technique: Multiplanar CT images of the lumbar spine were reconstructed from contemporary CT of the Abdomen and Pelvis. RADIATION DOSE REDUCTION: This exam was performed according to the departmental dose-optimization program which includes automated exposure control, adjustment of the mA and/or kV according to patient size and/or use of iterative reconstruction technique. CONTRAST:  None or No additional COMPARISON:  None Available. FINDINGS: Segmentation: 5 lumbar type vertebrae. Alignment: Normal. Vertebrae: No acute fracture or focal pathologic process. Paraspinal and other soft tissues: Negative.  Disc levels: Mildly reduced L5-S1 intervertebral disc height. Remaining intervertebral disc heights are largely maintained. Mild multilevel facet arthropathy and marginal osteophyte formation. IMPRESSION: *No acute osseous abnormality of the lumbar spine. Mild multilevel degenerative changes. Electronically Signed   By: Beula Brunswick M.D.   On: 10/25/2023 09:55   CT Renal Stone Study Result Date: 10/25/2023 CLINICAL DATA:  Abdominal/flank pain, stone suspected right flank pain. EXAM: CT ABDOMEN AND PELVIS WITHOUT CONTRAST TECHNIQUE: Multidetector CT imaging of the abdomen and pelvis was performed following the standard protocol without IV contrast. RADIATION DOSE REDUCTION: This exam was performed according to the departmental dose-optimization program which includes automated exposure control, adjustment of the mA and/or kV according to patient size and/or use of iterative reconstruction technique. COMPARISON:  CT scan abdomen and pelvis from 08/24/2022. FINDINGS: Lower chest: There are dependent changes in the visualized  lung bases. No overt consolidation. No pleural effusion. The heart is normal in size. No pericardial effusion. Hepatobiliary: The liver is normal in size. Non-cirrhotic configuration. No suspicious mass. These is mild diffuse hepatic steatosis. No intrahepatic or extrahepatic bile duct dilation. No calcified gallstones. Normal gallbladder wall thickness. No pericholecystic inflammatory changes. Pancreas: Unremarkable. No pancreatic ductal dilatation or surrounding inflammatory changes. Spleen: Within normal limits. No focal lesion. Adrenals/Urinary Tract: There is a stable 1.6 x 2.7 cm left adrenal adenoma. Unremarkable right adrenal gland. No suspicious renal mass within the limitations of this unenhanced exam. No nephroureterolithiasis or obstructive uropathy. Urinary bladder is under distended, precluding optimal assessment. However, no large mass or stones identified. No perivesical fat  stranding. Stomach/Bowel: There is a tiny sliding hiatal hernia. No disproportionate dilation of the small or large bowel loops. No evidence of abnormal bowel wall thickening or inflammatory changes. The appendix is unremarkable. Vascular/Lymphatic: No ascites or pneumoperitoneum. No abdominal or pelvic lymphadenopathy, by size criteria. No aneurysmal dilation of the major abdominal arteries. Reproductive: The uterus is surgically absent. No large adnexal mass. Other: There is a tiny fat containing periumbilical hernia. The soft tissues and abdominal wall are otherwise unremarkable. Musculoskeletal: No suspicious osseous lesions. There are mild multilevel degenerative changes in the visualized spine. IMPRESSION: 1. No nephroureterolithiasis or obstructive uropathy. 2. No acute inflammatory process identified within the abdomen or pelvis. Unremarkable appendix. 3. Multiple other nonacute observations, as described above. Electronically Signed   By: Beula Brunswick M.D.   On: 10/25/2023 09:45    Procedures Procedures    Medications Ordered in ED Medications  ondansetron  (ZOFRAN -ODT) disintegrating tablet 8 mg (8 mg Oral Given 10/25/23 0845)  oxyCODONE -acetaminophen  (PERCOCET/ROXICET) 5-325 MG per tablet 1 tablet (1 tablet Oral Given 10/25/23 0845)    ED Course/ Medical Decision Making/ A&P                                 Medical Decision Making Patient here with right-sided low back right flank pain.  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  Pain initially began in mid low back.  Radiates to flank and slightly to right lower abdomen.  History of kidney stones but states pain does not feel as intense as previous kidney stones  Patient is nontoxic-appearing on exam.  Vital signs reviewed.  Appears somewhat uncomfortable, has reproducible pain with movement.  No specific CVA tenderness or right lower quadrant tenderness on my exam.  I suspect her pain is  musculoskeletal although pyelonephritis, kidney stone, acute appendicitis also considered.  Patient has history of hysterectomy with bilateral salpingectomy, therefore pregnancy ectopic GYN process felt less likely  Amount and/or Complexity of Data Reviewed Labs: ordered.    Details: Labs, no evidence of leukocytosis, chemistries unremarkable.  Urinalysis without evidence of infection Radiology: ordered.    Details: CT renal stone study without obstructive uropathy or nephroureterolithiasis.  Appendix appears unremarkable.  CT imaging of the lumbar spine without acute bony abnormality there is mild multilevel degenerative changes of the lumbar spine Discussion of management or test interpretation with external provider(s): On recheck, patient resting comfortably after pain medication.  Workup today overall reassuring.  Low back and flank pain felt to be musculoskeletal in origin.  Patient agreeable to symptomatic treatment plan, short course of pain medication database reviewed.  She will follow-up closely outpatient with PCP and return precautions were given.  Risk Prescription  drug management.           Final Clinical Impression(s) / ED Diagnoses Final diagnoses:  Acute right-sided low back pain without sciatica    Rx / DC Orders ED Discharge Orders          Ordered    oxyCODONE -acetaminophen  (PERCOCET/ROXICET) 5-325 MG tablet  Every 4 hours PRN        10/25/23 1016    methocarbamol  (ROBAXIN ) 500 MG tablet  3 times daily        10/25/23 1016    predniSONE  (DELTASONE ) 10 MG tablet        10/25/23 [address] [place], PA-C 10/26/23 0932    Mozell Arias, MD 10/29/23 [phone]
